# Patient Record
Sex: Female | Born: 1985 | Race: White | Hispanic: No | Marital: Married | Smoking: Never smoker
Health system: Southern US, Community
[De-identification: ages and names within clinical notes are randomized; demographics above are authoritative.]

## PROBLEM LIST (undated history)

## (undated) ENCOUNTER — Inpatient Hospital Stay (HOSPITAL_COMMUNITY): Payer: Self-pay

## (undated) DIAGNOSIS — F419 Anxiety disorder, unspecified: Secondary | ICD-10-CM

## (undated) DIAGNOSIS — R87619 Unspecified abnormal cytological findings in specimens from cervix uteri: Secondary | ICD-10-CM

## (undated) DIAGNOSIS — N390 Urinary tract infection, site not specified: Secondary | ICD-10-CM

## (undated) DIAGNOSIS — IMO0002 Reserved for concepts with insufficient information to code with codable children: Secondary | ICD-10-CM

## (undated) DIAGNOSIS — Q752 Hypertelorism: Secondary | ICD-10-CM

## (undated) DIAGNOSIS — D649 Anemia, unspecified: Secondary | ICD-10-CM

## (undated) DIAGNOSIS — B359 Dermatophytosis, unspecified: Secondary | ICD-10-CM

## (undated) DIAGNOSIS — G43909 Migraine, unspecified, not intractable, without status migrainosus: Secondary | ICD-10-CM

## (undated) DIAGNOSIS — F329 Major depressive disorder, single episode, unspecified: Secondary | ICD-10-CM

## (undated) DIAGNOSIS — R480 Dyslexia and alexia: Secondary | ICD-10-CM

## (undated) DIAGNOSIS — F32A Depression, unspecified: Secondary | ICD-10-CM

## (undated) DIAGNOSIS — A749 Chlamydial infection, unspecified: Secondary | ICD-10-CM

## (undated) DIAGNOSIS — F319 Bipolar disorder, unspecified: Secondary | ICD-10-CM

## (undated) DIAGNOSIS — A549 Gonococcal infection, unspecified: Secondary | ICD-10-CM

## (undated) HISTORY — DX: Depression, unspecified: F32.A

## (undated) HISTORY — DX: Major depressive disorder, single episode, unspecified: F32.9

## (undated) HISTORY — PX: INDUCED ABORTION: SHX677

## (undated) HISTORY — PX: TONSILLECTOMY: SUR1361

## (undated) HISTORY — DX: Anxiety disorder, unspecified: F41.9

## (undated) HISTORY — DX: Hypertelorism: Q75.2

## (undated) HISTORY — PX: MYRINGOTOMY: SUR874

## (undated) HISTORY — PX: EYE SURGERY: SHX253

## (undated) HISTORY — DX: Dermatophytosis, unspecified: B35.9

## (undated) HISTORY — DX: Gonococcal infection, unspecified: A54.9

---
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
## 2005-01-08 ENCOUNTER — Emergency Department (HOSPITAL_COMMUNITY): Admission: EM | Admit: 2005-01-08 | Discharge: 2005-01-09 | Payer: Self-pay | Admitting: Emergency Medicine

## 2005-01-23 ENCOUNTER — Emergency Department (HOSPITAL_COMMUNITY): Admission: EM | Admit: 2005-01-23 | Discharge: 2005-01-23 | Payer: Self-pay | Admitting: Emergency Medicine

## 2005-02-25 ENCOUNTER — Emergency Department (HOSPITAL_COMMUNITY): Admission: EM | Admit: 2005-02-25 | Discharge: 2005-02-26 | Payer: Self-pay | Admitting: Emergency Medicine

## 2005-03-09 ENCOUNTER — Emergency Department (HOSPITAL_COMMUNITY): Admission: EM | Admit: 2005-03-09 | Discharge: 2005-03-09 | Payer: Self-pay | Admitting: Emergency Medicine

## 2005-04-21 ENCOUNTER — Emergency Department (HOSPITAL_COMMUNITY): Admission: EM | Admit: 2005-04-21 | Discharge: 2005-04-22 | Payer: Self-pay | Admitting: Emergency Medicine

## 2005-04-30 ENCOUNTER — Emergency Department (HOSPITAL_COMMUNITY): Admission: EM | Admit: 2005-04-30 | Discharge: 2005-04-30 | Payer: Self-pay | Admitting: Emergency Medicine

## 2005-11-04 ENCOUNTER — Other Ambulatory Visit: Admission: RE | Admit: 2005-11-04 | Discharge: 2005-11-04 | Payer: Self-pay | Admitting: Obstetrics and Gynecology

## 2006-04-30 ENCOUNTER — Emergency Department (HOSPITAL_COMMUNITY): Admission: EM | Admit: 2006-04-30 | Discharge: 2006-04-30 | Payer: Self-pay | Admitting: Emergency Medicine

## 2006-06-06 ENCOUNTER — Emergency Department (HOSPITAL_COMMUNITY): Admission: EM | Admit: 2006-06-06 | Discharge: 2006-06-07 | Payer: Self-pay | Admitting: Emergency Medicine

## 2006-10-18 ENCOUNTER — Emergency Department (HOSPITAL_COMMUNITY): Admission: EM | Admit: 2006-10-18 | Discharge: 2006-10-18 | Payer: Self-pay | Admitting: Emergency Medicine

## 2007-03-22 ENCOUNTER — Emergency Department (HOSPITAL_COMMUNITY): Admission: EM | Admit: 2007-03-22 | Discharge: 2007-03-22 | Payer: Self-pay | Admitting: Family Medicine

## 2007-08-23 ENCOUNTER — Emergency Department (HOSPITAL_COMMUNITY): Admission: EM | Admit: 2007-08-23 | Discharge: 2007-08-23 | Payer: Self-pay | Admitting: Emergency Medicine

## 2007-09-21 ENCOUNTER — Ambulatory Visit: Payer: Self-pay | Admitting: *Deleted

## 2007-09-21 ENCOUNTER — Encounter: Payer: Self-pay | Admitting: Internal Medicine

## 2007-09-21 DIAGNOSIS — F330 Major depressive disorder, recurrent, mild: Secondary | ICD-10-CM | POA: Insufficient documentation

## 2007-09-21 LAB — CONVERTED CEMR LAB
ALT: 23 units/L (ref 0–35)
AST: 15 units/L (ref 0–37)
Alkaline Phosphatase: 71 units/L (ref 39–117)
BUN: 11 mg/dL (ref 6–23)
Beta hcg, urine, semiquantitative: NEGATIVE
Calcium: 8.9 mg/dL (ref 8.4–10.5)
Chlamydia, Swab/Urine, PCR: NEGATIVE
Chloride: 107 meq/L (ref 96–112)
Cholesterol: 135 mg/dL (ref 0–200)
Eosinophils Absolute: 0.1 10*3/uL (ref 0.0–0.7)
Glucose, Bld: 91 mg/dL (ref 70–99)
Lymphocytes Relative: 24 % (ref 12–46)
Lymphs Abs: 2.1 10*3/uL (ref 0.7–4.0)
MCHC: 31.6 g/dL (ref 30.0–36.0)
Monocytes Relative: 5 % (ref 3–12)
Neutrophils Relative %: 70 % (ref 43–77)
Platelets: 266 10*3/uL (ref 150–400)
Potassium: 4.4 meq/L (ref 3.5–5.3)
Total CHOL/HDL Ratio: 3.2
Total Protein: 6.9 g/dL (ref 6.0–8.3)
Triglycerides: 104 mg/dL (ref <150)
VLDL: 21 mg/dL (ref 0–40)
WBC: 8.9 10*3/uL (ref 4.0–10.5)

## 2007-11-24 ENCOUNTER — Encounter: Admission: RE | Admit: 2007-11-24 | Discharge: 2007-11-24 | Payer: Self-pay | Admitting: Obstetrics and Gynecology

## 2008-03-14 ENCOUNTER — Encounter: Payer: Self-pay | Admitting: Internal Medicine

## 2008-03-14 ENCOUNTER — Ambulatory Visit: Payer: Self-pay | Admitting: Internal Medicine

## 2008-03-14 LAB — CONVERTED CEMR LAB: TSH: 1.398 microintl units/mL (ref 0.350–4.50)

## 2008-05-10 ENCOUNTER — Emergency Department (HOSPITAL_COMMUNITY): Admission: EM | Admit: 2008-05-10 | Discharge: 2008-05-10 | Payer: Self-pay | Admitting: Emergency Medicine

## 2008-05-19 ENCOUNTER — Emergency Department (HOSPITAL_COMMUNITY): Admission: EM | Admit: 2008-05-19 | Discharge: 2008-05-19 | Payer: Self-pay | Admitting: Emergency Medicine

## 2008-05-26 ENCOUNTER — Ambulatory Visit: Payer: Self-pay | Admitting: Internal Medicine

## 2008-05-26 DIAGNOSIS — S82899A Other fracture of unspecified lower leg, initial encounter for closed fracture: Secondary | ICD-10-CM | POA: Insufficient documentation

## 2008-05-30 ENCOUNTER — Encounter (INDEPENDENT_AMBULATORY_CARE_PROVIDER_SITE_OTHER): Payer: Self-pay | Admitting: Internal Medicine

## 2008-08-10 ENCOUNTER — Inpatient Hospital Stay (HOSPITAL_COMMUNITY): Admission: AD | Admit: 2008-08-10 | Discharge: 2008-08-10 | Payer: Self-pay | Admitting: Obstetrics and Gynecology

## 2008-10-10 ENCOUNTER — Encounter: Payer: Self-pay | Admitting: Internal Medicine

## 2008-10-17 ENCOUNTER — Ambulatory Visit: Payer: Self-pay | Admitting: Internal Medicine

## 2008-10-17 ENCOUNTER — Ambulatory Visit (HOSPITAL_COMMUNITY): Admission: RE | Admit: 2008-10-17 | Discharge: 2008-10-17 | Payer: Self-pay | Admitting: Internal Medicine

## 2008-10-17 DIAGNOSIS — N898 Other specified noninflammatory disorders of vagina: Secondary | ICD-10-CM | POA: Insufficient documentation

## 2008-10-17 DIAGNOSIS — K625 Hemorrhage of anus and rectum: Secondary | ICD-10-CM

## 2008-10-17 DIAGNOSIS — R1031 Right lower quadrant pain: Secondary | ICD-10-CM

## 2008-10-17 DIAGNOSIS — K6289 Other specified diseases of anus and rectum: Secondary | ICD-10-CM

## 2008-10-17 LAB — CONVERTED CEMR LAB
Basophils Relative: 0 % (ref 0–1)
CO2: 27 meq/L (ref 19–32)
Calcium: 9 mg/dL (ref 8.4–10.5)
Candida species: NEGATIVE
Creatinine, Ser: 0.7 mg/dL (ref 0.40–1.20)
Eosinophils Absolute: 0 10*3/uL (ref 0.0–0.7)
MCHC: 34.2 g/dL (ref 30.0–36.0)
MCV: 85.1 fL (ref >=78.0)
Monocytes Relative: 6 % (ref 3–12)
Neutrophils Relative %: 73 % (ref 43–77)
Platelets: 298 10*3/uL (ref 150–400)

## 2008-10-21 ENCOUNTER — Encounter: Payer: Self-pay | Admitting: Internal Medicine

## 2008-10-23 ENCOUNTER — Emergency Department (HOSPITAL_COMMUNITY): Admission: EM | Admit: 2008-10-23 | Discharge: 2008-10-23 | Payer: Self-pay | Admitting: Emergency Medicine

## 2008-11-06 ENCOUNTER — Emergency Department (HOSPITAL_COMMUNITY): Admission: EM | Admit: 2008-11-06 | Discharge: 2008-11-06 | Payer: Self-pay | Admitting: Emergency Medicine

## 2008-11-14 ENCOUNTER — Ambulatory Visit: Payer: Self-pay | Admitting: Gastroenterology

## 2009-01-26 ENCOUNTER — Emergency Department (HOSPITAL_COMMUNITY): Admission: EM | Admit: 2009-01-26 | Discharge: 2009-01-26 | Payer: Self-pay | Admitting: Emergency Medicine

## 2009-04-22 ENCOUNTER — Emergency Department (HOSPITAL_COMMUNITY): Admission: EM | Admit: 2009-04-22 | Discharge: 2009-04-22 | Payer: Self-pay | Admitting: Emergency Medicine

## 2009-05-03 ENCOUNTER — Emergency Department (HOSPITAL_COMMUNITY): Admission: EM | Admit: 2009-05-03 | Discharge: 2009-05-04 | Payer: Self-pay | Admitting: Emergency Medicine

## 2009-05-21 ENCOUNTER — Emergency Department (HOSPITAL_COMMUNITY): Admission: EM | Admit: 2009-05-21 | Discharge: 2009-05-22 | Payer: Self-pay | Admitting: Emergency Medicine

## 2009-05-23 ENCOUNTER — Emergency Department (HOSPITAL_COMMUNITY): Admission: EM | Admit: 2009-05-23 | Discharge: 2009-05-23 | Payer: Self-pay | Admitting: Emergency Medicine

## 2009-12-21 ENCOUNTER — Emergency Department (HOSPITAL_COMMUNITY): Admission: EM | Admit: 2009-12-21 | Discharge: 2009-12-21 | Payer: Self-pay | Admitting: Emergency Medicine

## 2010-03-09 ENCOUNTER — Encounter: Payer: Self-pay | Admitting: *Deleted

## 2010-05-24 LAB — URINALYSIS, ROUTINE W REFLEX MICROSCOPIC
Glucose, UA: NEGATIVE mg/dL
Ketones, ur: 15 mg/dL — AB
Specific Gravity, Urine: 1.036 — ABNORMAL HIGH (ref 1.005–1.030)
pH: 6 (ref 5.0–8.0)

## 2010-05-24 LAB — CBC
HCT: 40.8 % (ref 36.0–46.0)
Hemoglobin: 13.8 g/dL (ref 12.0–15.0)
MCV: 85.7 fL (ref 78.0–100.0)
Platelets: 291 10*3/uL (ref 150–400)
RBC: 4.76 MIL/uL (ref 3.87–5.11)
WBC: 8.9 10*3/uL (ref 4.0–10.5)

## 2010-05-24 LAB — POCT I-STAT, CHEM 8
BUN: 7 mg/dL (ref 6–23)
Calcium, Ion: 1.16 mmol/L (ref 1.12–1.32)
Creatinine, Ser: 0.9 mg/dL (ref 0.4–1.2)
Glucose, Bld: 99 mg/dL (ref 70–99)
Hemoglobin: 14.3 g/dL (ref 12.0–15.0)
Sodium: 142 mEq/L (ref 135–145)
TCO2: 27 mmol/L (ref 0–100)

## 2010-05-24 LAB — GC/CHLAMYDIA PROBE AMP, GENITAL: Chlamydia, DNA Probe: POSITIVE — AB

## 2010-05-24 LAB — DIFFERENTIAL
Eosinophils Absolute: 0 10*3/uL (ref 0.0–0.7)
Eosinophils Relative: 0 % (ref 0–5)
Lymphocytes Relative: 21 % (ref 12–46)
Lymphs Abs: 1.9 10*3/uL (ref 0.7–4.0)
Monocytes Absolute: 0.6 10*3/uL (ref 0.1–1.0)
Monocytes Relative: 7 % (ref 3–12)

## 2010-05-24 LAB — URINE MICROSCOPIC-ADD ON

## 2010-05-24 LAB — URINE CULTURE
Colony Count: NO GROWTH
Culture: NO GROWTH

## 2010-05-24 LAB — WET PREP, GENITAL: Clue Cells Wet Prep HPF POC: NONE SEEN

## 2010-06-19 ENCOUNTER — Encounter: Payer: Self-pay | Admitting: Internal Medicine

## 2010-06-19 ENCOUNTER — Ambulatory Visit (INDEPENDENT_AMBULATORY_CARE_PROVIDER_SITE_OTHER): Payer: Self-pay | Admitting: Internal Medicine

## 2010-06-19 VITALS — BP 126/86 | HR 67 | Temp 98.4°F | Ht 66.0 in | Wt 271.5 lb

## 2010-06-19 DIAGNOSIS — Z2089 Contact with and (suspected) exposure to other communicable diseases: Secondary | ICD-10-CM

## 2010-06-19 DIAGNOSIS — Z23 Encounter for immunization: Secondary | ICD-10-CM

## 2010-06-19 MED ORDER — PERMETHRIN 1 % EX LOTN: TOPICAL_LOTION | CUTANEOUS | Status: DC

## 2010-06-19 NOTE — Progress Notes (Signed)
  Subjective:    Patient ID: Martha Mathews, female    DOB: 02-01-1986, 25 y.o.   MRN: [id number]  HPI Here with focaReview of Systemsl c/o very pruritic rash. No known contacts with similar sx's-25 yr old at home, no rashes or sx's        Objective:   Physical Exam Punctate rash on trunk, skin folds, sparing face and scalp      Assessment & Plan:   Scabies Elimite cream Shown Med[facility] Plus video on topic, and instructed on precautions, washing of clothing, and what not to do.

## 2010-06-24 ENCOUNTER — Encounter: Payer: Self-pay | Admitting: Internal Medicine

## 2010-07-03 ENCOUNTER — Ambulatory Visit (INDEPENDENT_AMBULATORY_CARE_PROVIDER_SITE_OTHER): Payer: Medicaid Other | Admitting: Internal Medicine

## 2010-07-03 ENCOUNTER — Encounter: Payer: Self-pay | Admitting: Internal Medicine

## 2010-07-03 DIAGNOSIS — F330 Major depressive disorder, recurrent, mild: Secondary | ICD-10-CM

## 2010-07-03 DIAGNOSIS — J029 Acute pharyngitis, unspecified: Secondary | ICD-10-CM | POA: Insufficient documentation

## 2010-07-03 MED ORDER — ESCITALOPRAM OXALATE 10 MG PO TABS: 10.0000 mg | ORAL_TABLET | Freq: Every day | ORAL | Status: DC

## 2010-07-03 NOTE — Patient Instructions (Signed)
Pharyngitis (Viral and Bacterial) Pharyngitis is soreness (inflammation) or infection of the pharynx. It is also called a sore throat. CAUSES Most sore throats are caused by viruses and are part of a cold. However, some sore throats are caused by strep and other bacteria. Sore throats can also be caused by post nasal drip from draining sinuses, allergies and sometimes from sleeping with an open mouth. Infectious sore throats can be spread from person to person by coughing, sneezing and sharing cups or eating utensils. TREATMENT Sore throats that are viral usually last 3-4 days. Viral illness will get better without medications (antibiotics). Strep throat and other bacterial infections will usually begin to get better about 24-48 hours after you begin to take antibiotics. HOME CARE INSTRUCTIONS  If the caregiver feels there is a bacterial infection or if there is a positive strep test, they will prescribe an antibiotic. The full course of antibiotics must be taken!! If the full course of antibiotic is not taken, you or your child may become ill again. If you or your child has strep throat and do not finish all of the medication, serious heart or kidney diseases may develop.   Drink enough water and fluids to keep your urine clear or pale yellow.   Only take over-the-counter or prescription medicines for pain, discomfort or fever as directed by your caregiver.   Get lots of rest.   Gargle with salt water ( tsp. of salt in a glass of water) as often as every 1-2 hours as you need for comfort.   Hard candies may soothe the throat if individual is not at risk for choking. Throat sprays or lozenges may also be used.  SEEK MEDICAL CARE IF:  Large, tender lumps in the neck develop.   A rash develops.   Green, yellow-brown or bloody sputum is coughed up.   You or your child has an oral temperature above 102 F (38.9 C).   Your baby is older than 3 months with a rectal temperature of 100.5 F  (38.1 C) or higher for more than 1 day.  SEEK IMMEDIATE MEDICAL CARE IF:  A stiff neck develops.   You or your child are drooling or unable to swallow liquids.   You or your child are vomiting, unable to keep medications or liquids down.   You or your child has severe pain, unrelieved with recommended medications.   You or your child are having difficulty breathing (not due to stuffy nose).   You or your child are unable to fully open your mouth.   You or your child develop redness, swelling, or severe pain anywhere on the neck.   You or your child has an oral temperature above 102 F (38.9 C), not controlled by medicine.   Your baby is older than 3 months with a rectal temperature of 102 F (38.9 C) or higher.   Your baby is 52 months old or younger with a rectal temperature of 100.4 F (38 C) or higher.  MAKE SURE YOU:   Understand these instructions.   Will watch your condition.   Will get help right away if you are not doing well or get worse.  Document Released: 02/03/2005 Document Re-Released: 07/24/2009 [facility] Patient Information 2011 [place], [place].  1. Start the Lexapro 10 mg tablets daily.  If it makes you tired you can take it in the evening.  Keep an eye out for warning signs such as coming up with a plan to hurt yourself or others, thoughts  of hurting yourself or others, and not being able to get out of bed or eat or get the things you need to get done done.  If you experience them please call the clinic or go to the ER for help.  2. Follow up in 2-4 weeks to see how your doing.  If you experience very bad dry mouth, constipation, and inability to urinate you should call the office.

## 2010-07-03 NOTE — Progress Notes (Signed)
Subjective:    Patient ID: Martha Mathews, female    DOB: 1985-09-13, 25 y.o.   MRN: [id number]  HPI  Ms. Martha Mathews is a 25 year old woman who presents to the clinic today with complaints of a sore throat for the last 3 days.  She started to notice some pain with swallowing and a "scratchy" feeling in her throat about 3 days ago and it has progress to where it hurts to swallow both liquids and solids.  She states that the food does not get stuck and she has been able to eat, just painfully.  Denies any fevers, chills, nausea, vomiting, chest pain, night sweats, lumps in her neck or armpits, or choking sensation.  She has a history of strep throat as a child, tonsillectomy at age 25, and recurrent ear infections as a child. She does have some ear congestion and nasal congestion but denies any post nasal drip or productive cough.  She does have a mild non-productive cough.     She also states that she has been feeling more depressed as of late.  She has noticed that she isn't able to sleep more then a few hours per night as well as being very irritable with family members and her son.  She has some significant stressors in her life right now.  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  Her son was originally diagnosed with autism but now appears to be more ADHD and is in counseling at age 25.  She denies any SI/HI.  She reports decreased concentration, sleeplessness, difficulty falling asleep, decreased motivation, and some feelings of doom like things are happening to her son.  She denies any family history of bipolar disorder but has been treated for depression in the past.  Denies any hallucinations, racing thoughts, or mania.  She also denies drug use and tobacco use.  She does drink alcohol on the weekends with friends but no more then 1 or 2 drinks in a sitting.   Review of Systems    Constitutional: Denies fever, chills, diaphoresis, appetite change and fatigue.  HEENT:  Denies photophobia, eye pain, redness, hearing loss, ear pain, congestion, sore throat, rhinorrhea, sneezing, mouth sores, trouble swallowing, neck pain, neck stiffness and tinnitus.   Respiratory: Denies SOB, DOE, cough, chest tightness,  and wheezing.   Cardiovascular: Denies chest pain, palpitations and leg swelling.  Gastrointestinal: Denies nausea, vomiting, abdominal pain, diarrhea, constipation, blood in stool and abdominal distention.  Genitourinary: Denies dysuria, urgency, frequency, hematuria, flank pain and difficulty urinating.  Musculoskeletal: Denies myalgias, back pain, joint swelling, arthralgias and gait problem.  Skin: Denies pallor, rash and wound.  Neurological: Denies dizziness, seizures, syncope, weakness, light-headedness, numbness and headaches.  Hematological: Denies adenopathy. Easy bruising, personal or family bleeding history  Psychiatric/Behavioral: Positive for mood changes,sleep disturbance, and irritability Denies suicidal ideation,  confusion, nervousness, and agitation  Objective:   Physical Exam    Constitutional: Vital signs reviewed.  Patient is a well-developed and well-nourished woman in no acute distress and cooperative with exam. Alert and oriented x3.  Head: Normocephalic and atraumatic Ear: TM normal bilaterally, no erythema or bulging of the disks.  Mouth: mild pharyngeal erythema but no exudates, MMM Eyes: PERRL, EOMI, conjunctivae normal, No scleral icterus.  Neck: Supple, no lymphadenopathy, Trachea midline normal ROM, No JVD, mass, thyromegaly, or carotid bruit present.  Cardiovascular: RRR, S1 normal, S2 normal, no MRG, pulses symmetric and intact bilaterally Pulmonary/Chest: CTAB, no  wheezes, rales, or rhonchi Abdominal: Soft. Non-tender, non-distended, bowel sounds are normal, no masses, organomegaly, or guarding present.  GU: no CVA tenderness Musculoskeletal: No joint deformities, erythema, or stiffness, ROM full and no  nontender Hematology: no cervical, inginal, or axillary adenopathy.  Neurological: A&O x3, Strength is normal and symmetric bilaterally, cranial nerve II-XII are grossly intact, no focal motor deficit, sensory intact to light touch bilaterally.  Skin: Warm, dry and intact. No rash, cyanosis, or clubbing.  Psychiatric: tearful mood and flat affect. speech and behavior is normal. Judgment and thought content normal. Cognition and memory are normal.       Assessment & Plan:

## 2010-07-03 NOTE — Assessment & Plan Note (Signed)
She is undergoing another episode of her depression.  She has had some significant social stressors and states that her mood is now interferring with her life.  She has been on lexapro in the past and that seemed to help so we will start with Lexapro 10 mg daily and have her follow up in 2-4 weeks to see how she is doing.  If she is unable to afford the medication or if the insurance will not cover it she was told to call and we will plan to switch her to Celexa 20 mg daily.

## 2010-07-03 NOTE — Assessment & Plan Note (Signed)
Her symptoms are consistent with pharyngitis.  I don't see any indication of need to test for strep throat and since we are in the first 3 days of the illness it is likely viral in origin.  We will treat her conservatively for now with salt water gargles and cepachol and chloraseptic spray for comfort as well as tylenol and advil for the pain.  If in the next 5-7 days it has not improved she will call and we will give her a course of amoxicillin.  She was given information on pharyngitis and instructions to call if she gets a high fever or the pain is intolerable.

## 2010-07-05 NOTE — Op Note (Signed)
NAME:  DIAMANTE, TRUSZKOWSKI               ACCOUNT NO.:  [id number]   MEDICAL RECORD NO.:  [id number]          PATIENT TYPE:  INP   LOCATION:  1512                         FACILITY:  [facility]   PHYSICIAN:  Ollen Gross. Vernell Morgans, M.D. DATE OF BIRTH:  Jan 17, 1986   DATE OF PROCEDURE:  08/17/2004  DATE OF DISCHARGE:  08/18/2004                                 OPERATIVE REPORT   PREOPERATIVE DIAGNOSIS:  Gallstones.   POSTOP DIAGNOSIS:  Gallstones.   PROCEDURE:  Laparoscopic cholecystectomy with intraoperative cholangiogram.   SURGEON:  Dr. Carolynne Edouard.   ASSISTANT:  Dr. Orson Slick   ANESTHESIA:  General endotracheal.   PROCEDURE:  After informed consent was obtained, the patient was brought to  the operating room and placed in supine position on the operating table.  After adequate induction of general anesthesia, the patient's abdomen was  prepped with Betadine and draped in usual sterile manner. The area above the  umbilicus was infiltrated 0.25% Marcaine. A small incision was made with a  15 blade knife. This incision was carried down through the subcutaneous  tissue bluntly with a hemostat and [employer] retractors until the alba was  identified. The linea alba was incised with a 15 blade knife and each side  was grasped with Kocher clamps and elevated anteriorly. The preperitoneal  space was then probed bluntly with hemostat until the peritoneum was opened  and access was gained to the abdominal cavity. The 0 Vicryl pursestring  stitch was placed in the fascia surrounding the opening. A Hasson cannula  was placed through the opening and anchored in place with the previously  placed Vicryl pursestring stitch.  The abdomen was then insufflated carbon  dioxide without difficulty. The patient was placed in head-up position and  rotated slightly with the right side up.  The laparoscope was inserted  through the Hasson cannula and the right upper quadrant was inspected. The  dome of the gallbladder and liver  readily identified. Next the epigastric  region was infiltrated 4% Marcaine. A small incision was made with a 15  blade knife and a 10 mm port was placed bluntly through this incision into  the abdominal cavity under direct vision. Sites were then chosen laterally  on the right side of the abdomen for placement of the 5 mm ports. Each of  these areas was infiltrated with .25% Marcaine. Small stab incisions were  made with a 15 blade knife and 5 mm ports were placed bluntly through these  incisions into the abdominal cavity under direct vision.  A blunt grasper  was placed through the lateral most 5 mm port and used to grasp the dome of  the gallbladder and elevate it anteriorly and superiorly. Another blunt  grasper was placed through the other 5 mm port and used to retract on the  body and neck of the gallbladder. A dissector was placed through the  epigastric port and using electrocautery, the peritoneal reflection of the  gallbladder neck was opened. Blunt dissection was then carried out in this  area until the gallbladder neck cystic duct junction was readily identified  and a  good window was created. A single clip was placed on the gallbladder  neck. A small ductotomy was made just below the clip with a laparoscopic  scissors. A 14 gauge angiocath was then placed percutaneously through the  anterior abdominal wall under direct vision. A Reddick cholangiogram  catheter was placed through the angiocath and flushed. The Reddick catheter  was then placed within the cystic duct and anchored in place with the clip.  A cholangiogram was obtained that showed no filling defects, good emptying  into the duodenum and good length on the cystic duct.  The anchoring clip  and catheters were then removed from the patient. Three clips were placed  proximally on the cystic duct and duct was divided between the two sets of  clips. Posterior to this, the cystic artery was identified and again  dissected  bluntly in a circumferential manner until a good window was  created. Two clips were placed proximally and one distally in the artery and  the artery was divided between the two sets of clips. Next a laparoscopic  hook cautery device and used to separate the gallbladder from the liver bed.  Prior to completely detaching the gallbladder from the liver bed. The liver  bed was inspected and several small bleeding points were coagulated with  electrocautery until the area was completely hemostatic. The gallbladder was  then detached arrest away from the liver bed without difficulty using the  hook electrocautery. The laparoscope was then moved to the epigastric port.  The gallbladder grasper was placed through the Hasson cannula and grasped  the neck of the gallbladder. The gallbladder with the Hasson cannula was  then removed through the supraumbilical port without difficulty.  The  fascial defect was closed with the previously placed Vicryl pursestring  stitch as well as with another interrupted 0 Vicryl stitch.  The abdomen was  then irrigated with copious amounts of saline until the effluent was clear.  The rest of the ports were removed under direct vision and were all found to  be hemostatic. The gas was allowed to escape. Skin incisions were all closed  with interrupted 4-0 Monocryl subcuticular stitches. Benzoin, Steri-Strips  and sterile dressings were applied. The patient tolerated well. At the end  of the case, all needle, sponge and instrument counts were correct. The  patient was then awakened and taken to recovery room in stable condition.       PST/MEDQ  D:  08/19/2004  T:  08/19/2004  Job:  [id number]

## 2010-07-05 NOTE — H&P (Signed)
NAME:  Martha, Mathews               ACCOUNT NO.:  [id number]   MEDICAL RECORD NO.:  [id number]          PATIENT TYPE:  MAT   LOCATION:  MATC                          FACILITY:  [facility]   PHYSICIAN:  Ollen Gross. Vernell Morgans, M.D. DATE OF BIRTH:  [birth year]   DATE OF ADMISSION:  08/16/2004  DATE OF DISCHARGE:                                HISTORY & PHYSICAL   HISTORY:  Ms. Martha Mathews is a 25 year old white female whose now nine days status  post C section. She has had known gallstones since April. She has been  having, over the last few weeks, severe right upper quadrant pain that has  been occuring on a daily basis and was not associated with nausea and  vomiting until today. She denies any fevers or chills. The pain is mostly in  the right upper quadrant and radiates to her back. The pain has not been  controllable with narcotic pain medicine at home. The rest of her review of  systems is unremarkable.   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   PAST SURGICAL HISTORY:  Significant for C section, tonsils and adenoids, eye  surgery, myringotomy tubes.   MEDICATIONS:  Percocet.   ALLERGIES:  No known drug allergies.   SOCIAL HISTORY:  She denies the use of alcohol or tobacco products.   FAMILY HISTORY:  Noncontributory.   PHYSICAL EXAMINATION:  VITAL SIGNS:  Her temperature is 97 degrees, blood  pressure 105/54, pulse of 86.  GENERAL:  She is an obese white female in no acute distress.  SKIN:  Warm and dry with no jaundice.  HEENT:  Eyes are extraocular muscles are intact. Pupils equal round and  reactive to light. Sclerae nonicteric.  LUNGS:  Clear bilaterally with no use of accessory respiratory muscles.  HEART:  Regular rate and rhythm with an impulse in the left chest.  ABDOMEN:  Soft with some mild to moderate right upper quadrant pain but no  guarding or peritoneal signs.  EXTREMITIES:  No cyanosis, clubbing or edema with good strength in  her arms  and legs.  PSYCHOLOGIC:  She is alert and oriented x3 with no evidence of anxiety or  depression.   LABORATORY DATA:  On review of her lab work, her white count was 13.4. Her  SGOT, SGPT, alk phos were slightly elevated, total bili was normal.   On reviewing her ultrasound report, she does have stones in her gallbladder  but no gallbladder wall thickening or ductal dilatation.   ASSESSMENT/PLAN:  This is a 25 year old white female with what sounds like  symptomatic gallstones. Her pain is now unable to be controlled with  narcotic pain medicine and I suspect she is going to require cholecystectomy  to relieve her pain in the near future. Having had a C section nine days ago  may complicate her surgery a little bit given there are adhesions, but I do  not think she will be able to wait six weeks from her last surgery. I have  discussed this with her in detail including the risks and benefits  of  surgery as well as some of the technical aspects and she understands and  wishes to proceed as soon as possible. We will transfer her to [facility] and admit her and plan for surgery in the morning.       PST/MEDQ  D:  08/16/2004  T:  08/16/2004  Job:  [id number]

## 2010-07-05 NOTE — Discharge Summary (Signed)
NAME:  Martha Mathews, Martha Mathews               ACCOUNT NO.:  [id number]   MEDICAL RECORD NO.:  [id number]          PATIENT TYPE:  INP   LOCATION:  9116                          FACILITY:  [facility]   PHYSICIAN:  Malachi Pro. Ambrose Mantle, M.D. DATE OF BIRTH:  14-Sep-1985   DATE OF ADMISSION:  08/07/2004  DATE OF DISCHARGE:  08/10/2004                                 DISCHARGE SUMMARY   HISTORY OF PRESENT ILLNESS:  This is a 25 year old white female admitted to  the hospital for cesarean section because of intrauterine pregnancy at 38  weeks, Chiari malformation and cholelithiasis.  This patient's prenatal  course is dictated in the history and physical.   HOSPITAL COURSE:  She underwent a primary low transverse cervical cesarean  section by Dr. Jackelyn Knife with Dr. Ambrose Mantle assisted under general anesthesia.  Blood loss was estimated at about 1000 mL.  The infant was healthy at birth.  It was a living female infant, 7 pounds 5 ounces, with Apgars of 8 at one  minute and 9 at five minutes.  Postoperatively, the patient did very well.  She was ambulatory, tolerating a diet and voiding well without difficulty.  Her abdomen was soft and nontender.  The incision was healing well.  Staples  were removed.  Strips were applied.  She was ready for discharge on the  third postoperative day.   LABORATORY DATA:  Blood group and type were O positive with a negative  antibody.  RPR was nonreactive.  Rubella was immune.  The initial hemoglobin  was 10.9, hematocrit 33.0, white count 10,400 and platelet count 350,000.  Followup hemoglobin 7.8, hematocrit 23.2 and white count 11,600.  RPR was  nonreactive.   FINAL DIAGNOSES:  1.  Intrauterine pregnancy at 38 weeks, delivered vertex by cesarean      section.  2.  Cholelithiasis.  3.  Chiari malformation.   OPERATION:  Low transverse cervical C-section.   FINAL CONDITION:  Improved.   INSTRUCTIONS:  Include our regular discharge instruction booklet.   DISCHARGE MEDICATIONS:   The patient is given a prescription for Percocet  5/325 mg 24 tablets one every four to six hours as needed for pain.   FOLLOWUP:  The patient is advised to return in two weeks for followup  examination.       TFH/MEDQ  D:  08/10/2004  T:  08/11/2004  Job:  [id number]

## 2010-07-18 ENCOUNTER — Encounter: Payer: Self-pay | Admitting: Internal Medicine

## 2010-07-18 ENCOUNTER — Ambulatory Visit (INDEPENDENT_AMBULATORY_CARE_PROVIDER_SITE_OTHER): Payer: Medicaid Other | Admitting: Internal Medicine

## 2010-07-18 DIAGNOSIS — G44209 Tension-type headache, unspecified, not intractable: Secondary | ICD-10-CM | POA: Insufficient documentation

## 2010-07-18 DIAGNOSIS — F330 Major depressive disorder, recurrent, mild: Secondary | ICD-10-CM

## 2010-07-18 NOTE — Patient Instructions (Signed)
Tension Headache (Muscle Contraction Headache) Tension headache is one of the most common causes of head pain. These headaches are usually felt as a pain over the top of your head and back of your neck. Stress, anxiety, and depression are common triggers for these headaches. Tension headaches are not life-threatening and will not lead to other types of headaches. Tension headaches can often be diagnosed by taking a history from the patient and a physical exam. Sometimes, further lab and x-ray studies are used to confirm the diagnosis. Your caregiver can advise you on how to get help solving problems that cause anxiety or stress. Antidepressants can be prescribed if depression is a problem. HOME CARE INSTRUCTIONS  If testing was done, call for your results. Remember, it is your responsibility to get the results of all testing. Do not assume everything is fine because you do not hear from your caregiver.   Only take over-the-counter or prescription medicines for pain, discomfort, or fever as directed by your caregiver.   Biofeedback, massage, or other relaxation techniques may be helpful.   Ice packs or heat to the head and neck can be used. Use these three to four times per day or as needed.   Physical therapy may be a useful addition to treatment.   If headaches continue, even with therapy, you may need to think about lifestyle changes.   Avoid excessive use of pain killers, as rebound headaches can occur.  1.  SEEK MEDICAL CARE IF:  You develop problems with medications prescribed.   You do not respond or get no relief from medications.   You have a change from the usual headache.   You develop nausea (feeling sick to your stomach) or vomiting.  SEEK IMMEDIATE MEDICAL CARE IF:   Your headache becomes severe.   You have an unexplained oral temperature above 101.   You develop a stiff neck.   You have loss of vision.   You have muscular weakness.   You have loss of muscular  control.   You develop severe symptoms different from your first symptoms.   You start losing your balance or have trouble walking.   You feel faint or pass out.  MAKE SURE YOU:   Understand these instructions.   Will watch your condition.   Will get help right away if you are not doing well or get worse.  Document Released: 02/03/2005 Document Re-Released: 05/02/2008 [facility] Patient Information 2011 [place], [place].  1. Continue taking your medication as prescribed.  2. Your headaches are from tension headaches. You can use the information to help you manage this.  3. You can use Ibuprofen 200 mg tablets 2-3 tablets every 6 hours as needed for the pain.  It is safe to take Tylenol 500 mg tablets as well 1-2 tablets every 6 hours as needed as well.  The best would be to take the medication early in the pain and you can alternate the ibuprofen and tylenol.

## 2010-07-18 NOTE — Progress Notes (Signed)
  Subjective:    Patient ID: Martha Mathews, female    DOB: 08/15/1985, 25 y.o.   MRN: [id number]  HPI  Ms. Martha Mathews is a 25 year old woman who presents today for follow up from her last visit.  She was started on Lexapro 10 mg at that visit and has been taking it without any side effects.  She states that she feels the same but her family has stated that she is less irritable and grumpy.  She denies any SI/HI or trouble sleeping today.     She has also had a headache for the past 2 days.  The pain is in the back of her head and is associated with some nausea.  She states that she wakes up and is fine in the morning but as the day goes on the headache gets progressively worse and by the evening it is a tight, sharp pain in her head.  She has tried only 1 ibuprofen tablet when the headache starts and that seems to help some.  She denies any fevers, chills, neck stiffness, vomiting, or trauma.  Review of Systems    Constitutional: Denies fever, chills, diaphoresis, appetite change and fatigue.  HEENT: Denies photophobia, eye pain, redness, hearing loss, ear pain, congestion, sore throat, rhinorrhea, sneezing, mouth sores, trouble swallowing, neck pain, neck stiffness and tinnitus.   Respiratory: Denies SOB, DOE, cough, chest tightness,  and wheezing.   Cardiovascular: Denies chest pain, palpitations and leg swelling.  Gastrointestinal: Denies nausea, vomiting, abdominal pain, diarrhea, constipation, blood in stool and abdominal distention.  Genitourinary: Denies dysuria, urgency, frequency, hematuria, flank pain and difficulty urinating.  Musculoskeletal: Denies myalgias, back pain, joint swelling, arthralgias and gait problem.  Skin: Denies pallor, rash and wound.  Neurological: Positive for headaches Denies dizziness, seizures, syncope, weakness, light-headedness, numbness.  Hematological: Denies adenopathy. Easy bruising, personal or family bleeding history  Psychiatric/Behavioral: Denies suicidal  ideation, mood changes, confusion, nervousness, sleep disturbance and agitation   Objective:   Physical Exam    Constitutional: Vital signs reviewed.  Patient is a well-developed and well-nourished, obese woman in no acute distress and cooperative with exam. Alert and oriented x3.  Head: Normocephalic and atraumatic Ear: TM normal bilaterally Mouth: no erythema or exudates, MMM Eyes: PERRL, EOMI, conjunctivae normal, No scleral icterus.  Neck: Supple, Trachea midline, ROM limited by pain in the flexion, extension, and bending bilaterally,  There is point tenderness over the trapezius insertion point in the base of the neck that triggered a headache.  No JVD, mass, thyromegaly, or carotid bruit present.  Cardiovascular: RRR, S1 normal, S2 normal, no MRG, pulses symmetric and intact bilaterally Pulmonary/Chest: CTAB, no wheezes, rales, or rhonchi Abdominal: Soft. Non-tender, non-distended, bowel sounds are normal, no masses, organomegaly, or guarding present.  GU: no CVA tenderness Musculoskeletal: No joint deformities, erythema, or stiffness, ROM full and no nontender Hematology: no cervical, inginal, or axillary adenopathy.  Neurological: A&O x3, Strength is normal and symmetric bilaterally, cranial nerve II-XII are grossly intact, no focal motor deficit, sensory intact to light touch bilaterally.  Skin: Warm, dry and intact. No rash, cyanosis, or clubbing.  Psychiatric: Mood is improved and affect is less flat. speech and behavior is normal. Judgment and thought content normal. Cognition and memory are normal.    Assessment & Plan:

## 2010-07-29 ENCOUNTER — Emergency Department (HOSPITAL_COMMUNITY)
Admission: EM | Admit: 2010-07-29 | Discharge: 2010-07-30 | Disposition: A | Discharge status: Home / Self Care | Payer: Medicaid Other | Attending: Emergency Medicine | Admitting: Emergency Medicine

## 2010-07-29 DIAGNOSIS — F3289 Other specified depressive episodes: Secondary | ICD-10-CM | POA: Insufficient documentation

## 2010-07-29 DIAGNOSIS — M62838 Other muscle spasm: Secondary | ICD-10-CM | POA: Insufficient documentation

## 2010-07-29 DIAGNOSIS — W108XXA Fall (on) (from) other stairs and steps, initial encounter: Secondary | ICD-10-CM | POA: Insufficient documentation

## 2010-07-29 DIAGNOSIS — R071 Chest pain on breathing: Secondary | ICD-10-CM | POA: Insufficient documentation

## 2010-07-29 DIAGNOSIS — M549 Dorsalgia, unspecified: Secondary | ICD-10-CM | POA: Insufficient documentation

## 2010-07-29 DIAGNOSIS — F329 Major depressive disorder, single episode, unspecified: Secondary | ICD-10-CM | POA: Insufficient documentation

## 2010-07-29 NOTE — Assessment & Plan Note (Signed)
She is improving on the Lexapro.  She was counseled that to see the full effect of the medication will take 6-8 weeks.  She is on a lower dose as well so we may need to adjust the dose in about a month when she follows up.

## 2010-07-29 NOTE — Assessment & Plan Note (Signed)
Her headache is consistent with a tension headache.  She was given stretching exercises to do as well as encouraged to use ice or heat packs whichever feel better.  We will also have her use Ibuprofen 200 mg tablets take 2-3 when the headache starts and continue q6 as needed for pain.

## 2010-07-30 ENCOUNTER — Emergency Department (HOSPITAL_COMMUNITY): Payer: Medicaid Other

## 2010-08-02 ENCOUNTER — Encounter: Payer: Self-pay | Admitting: Internal Medicine

## 2010-08-02 ENCOUNTER — Ambulatory Visit (INDEPENDENT_AMBULATORY_CARE_PROVIDER_SITE_OTHER): Payer: Medicaid Other | Admitting: Internal Medicine

## 2010-08-02 VITALS — BP 130/80 | HR 64 | Temp 97.0°F | Ht 65.6 in | Wt 268.7 lb

## 2010-08-02 DIAGNOSIS — R21 Rash and other nonspecific skin eruption: Secondary | ICD-10-CM

## 2010-08-02 DIAGNOSIS — B880 Other acariasis: Secondary | ICD-10-CM

## 2010-08-02 DIAGNOSIS — R238 Other skin changes: Secondary | ICD-10-CM | POA: Insufficient documentation

## 2010-08-02 DIAGNOSIS — F330 Major depressive disorder, recurrent, mild: Secondary | ICD-10-CM

## 2010-08-02 MED ORDER — DIPHENHYDRAMINE HCL 25 MG PO CAPS: 25.0000 mg | ORAL_CAPSULE | Freq: Four times a day (QID) | ORAL | Status: DC | PRN

## 2010-08-02 NOTE — Patient Instructions (Signed)
Please schedule a follow up visit as needed with PCP. Please take your medicines as prescribed. Please call the clinic if your symptoms fail to improve or does not get better in 2 weeks.

## 2010-08-02 NOTE — Assessment & Plan Note (Signed)
DD include chiggers vs poison ivy vs other insect bite. This is most likely chiggers given the the location of the lesions, intensity of itching the patient is having, characteristic grouped vesicular appearance and recent outdoor activity Patient was educated that these are self resolving and it might take one or 2 weeks for the lesions to resolve. Vigorous cleansing of lesions with soap and water was also advised. She was given a prescription for Benadryl for itching. She was advised to take a hot shower bath which she already did.

## 2010-08-02 NOTE — Progress Notes (Signed)
  Subjective:    Patient ID: Martha Mathews, female    DOB: 1985-12-15, 25 y.o.   MRN: [id number]  HPI: 25 year old woman with past medical history significant for depression comes to the clinic for blisters x2 days.  She noticed small, red, about 1-2 mm in diameter, circular lesions , 3-4 in number, on the anterior aspect of her right leg 3 days ago. They have been gradually increasing in size, are painful, associated with intense itching and have turned into blisters since yesterday. She has been scratching them rigorously, he started oozing some fluid since yesterday. She also got a new lesion on her right hand.  She states -she sits in her garden every day but was not sure if it was an insect bite.  Denies any fever, abdominal pain, nausea, vomiting or diarrhea. Denies any history of travel or taking any new medication. Denies having similar kind of lesions in the past.    Review of Systems  Constitutional: Negative for fever, chills, diaphoresis, activity change, appetite change and fatigue.  HENT: Negative for congestion, rhinorrhea, sneezing and postnasal drip.   Respiratory: Negative for cough, choking, chest tightness, shortness of breath and wheezing.   Cardiovascular: Negative for chest pain, palpitations and leg swelling.  Gastrointestinal: Negative for abdominal distention.  Genitourinary: Negative for flank pain and difficulty urinating.  Musculoskeletal: Negative for arthralgias.  Neurological: Negative for dizziness.  Hematological: Negative for adenopathy.       Objective:   Physical Exam  Constitutional: She is oriented to person, place, and time. She appears well-developed and well-nourished. No distress.  HENT:  Head: Normocephalic and atraumatic.  Mouth/Throat: Oropharynx is clear and moist.  Eyes: Conjunctivae and EOM are normal. Pupils are equal, round, and reactive to light.  Neck: Normal range of motion. Neck supple. No JVD present. No tracheal deviation  present. No thyromegaly present.  Cardiovascular: Normal rate, regular rhythm, normal heart sounds and intact distal pulses.   Pulmonary/Chest: Effort normal and breath sounds normal. No stridor. No respiratory distress. She has no wheezes. She has no rales. She exhibits no tenderness.  Abdominal: Soft. Bowel sounds are normal. She exhibits no distension and no mass. There is no tenderness. There is no rebound and no guarding.  Musculoskeletal: Normal range of motion.       5 red, circular about 3-4 mm in diameter, clustered lesions present, 2 of them appear macular, 3 appears vesicles on the anterior aspect of her right leg  Lymphadenopathy:    She has no cervical adenopathy.  Neurological: She is alert and oriented to person, place, and time. She has normal reflexes.  Skin: Skin is warm. She is not diaphoretic.          Assessment & Plan:

## 2010-08-02 NOTE — Assessment & Plan Note (Signed)
Denies any depressed mood. The medication has been helping her at current dose. Did not increase the dose.

## 2010-08-06 ENCOUNTER — Telehealth: Payer: Self-pay | Admitting: *Deleted

## 2010-08-06 NOTE — Telephone Encounter (Signed)
Call from pt left message that she has been drained would like to know what to do.  Message left for pt to call the Clinics.

## 2010-08-06 NOTE — Telephone Encounter (Signed)
Pt states she has been feeling weak and dizzy x 2 days. Wanted to know if Zoloft could be causing this; I told her I'm not sure. Zoloft is not on current med list and this is not a new medication.  Appt given w/ Dr. Loistine Chance for tomorrow @ 1:15PM; pt agreed.

## 2010-08-07 ENCOUNTER — Encounter: Payer: Self-pay | Admitting: Internal Medicine

## 2010-08-07 ENCOUNTER — Ambulatory Visit (INDEPENDENT_AMBULATORY_CARE_PROVIDER_SITE_OTHER): Payer: Medicaid Other | Admitting: Internal Medicine

## 2010-08-07 ENCOUNTER — Ambulatory Visit: Payer: Medicaid Other | Admitting: Internal Medicine

## 2010-08-07 VITALS — BP 133/88 | HR 78 | Temp 97.6°F | Ht 65.0 in | Wt 266.8 lb

## 2010-08-07 DIAGNOSIS — R238 Other skin changes: Secondary | ICD-10-CM

## 2010-08-07 DIAGNOSIS — R42 Dizziness and giddiness: Secondary | ICD-10-CM

## 2010-08-07 DIAGNOSIS — R109 Unspecified abdominal pain: Secondary | ICD-10-CM | POA: Insufficient documentation

## 2010-08-07 DIAGNOSIS — R21 Rash and other nonspecific skin eruption: Secondary | ICD-10-CM

## 2010-08-07 LAB — URINALYSIS, ROUTINE W REFLEX MICROSCOPIC
Bilirubin Urine: NEGATIVE
Glucose, UA: NEGATIVE mg/dL
Ketones, ur: NEGATIVE mg/dL
Specific Gravity, Urine: 1.027 (ref 1.005–1.030)

## 2010-08-07 LAB — CBC WITH DIFFERENTIAL/PLATELET
Basophils Absolute: 0 10*3/uL (ref 0.0–0.1)
Eosinophils Absolute: 0 10*3/uL (ref 0.0–0.7)
Eosinophils Relative: 0 % (ref 0–5)
HCT: 40.7 % (ref 36.0–46.0)
Lymphocytes Relative: 27 % (ref 12–46)
MCH: 27.5 pg (ref 26.0–34.0)
MCV: 84.3 fL (ref 78.0–100.0)
Monocytes Absolute: 0.4 10*3/uL (ref 0.1–1.0)
Platelets: 303 10*3/uL (ref 150–400)
RDW: 14.3 % (ref 11.5–15.5)
WBC: 9.5 10*3/uL (ref 4.0–10.5)

## 2010-08-07 LAB — HEPATIC FUNCTION PANEL
AST: 20 U/L (ref 0–37)
Alkaline Phosphatase: 64 U/L (ref 39–117)
Bilirubin, Direct: 0.1 mg/dL (ref 0.0–0.3)
Total Bilirubin: 0.4 mg/dL (ref 0.3–1.2)

## 2010-08-07 NOTE — Assessment & Plan Note (Signed)
Dizziness is not severe. Asked to drink plenty of fluid, BP is fine. Again unclear etiology. 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 Consider further work up if symptoms not resolved.

## 2010-08-07 NOTE — Patient Instructions (Signed)
Return in two weeks if not improved Call us if having fevers, nausea, vomiting or go to the Emergency Room Doctor will call you IF ANYTHING WRONG with your labs Drink plenty of water

## 2010-08-07 NOTE — Assessment & Plan Note (Signed)
Improved. Likely chigger bites as diagnosed before but would need to rule out lymes disease.

## 2010-08-07 NOTE — Assessment & Plan Note (Signed)
Unclear etiology. Not sure related to her previous insect bite. I will get LFTs, BMP to help with possible etiologies. Also check for UA and urine pregnancy.
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
## 2010-08-08 ENCOUNTER — Telehealth: Payer: Self-pay | Admitting: *Deleted

## 2010-08-08 LAB — BASIC METABOLIC PANEL WITH GFR
BUN: 10 mg/dL (ref 6–23)
CO2: 28 mEq/L (ref 19–32)
Calcium: 9.3 mg/dL (ref 8.4–10.5)
Chloride: 103 mEq/L (ref 96–112)
Creat: 0.69 mg/dL (ref 0.50–1.10)
GFR, Est Non African American: >60 mL/min (ref >60)

## 2010-08-08 NOTE — Telephone Encounter (Signed)
Pt calls and states she would like for md to call her w/ results of her "blood tests" may call at [phone]

## 2010-08-09 NOTE — Telephone Encounter (Signed)
Called the patient and let her know that it was negative.

## 2010-08-23 ENCOUNTER — Telehealth: Payer: Self-pay | Admitting: Internal Medicine

## 2010-08-23 ENCOUNTER — Emergency Department (HOSPITAL_COMMUNITY)
Admission: EM | Admit: 2010-08-23 | Discharge: 2010-08-24 | Disposition: A | Discharge status: Home / Self Care | Payer: Medicaid Other | Attending: Emergency Medicine | Admitting: Emergency Medicine

## 2010-08-23 DIAGNOSIS — F329 Major depressive disorder, single episode, unspecified: Secondary | ICD-10-CM | POA: Insufficient documentation

## 2010-08-23 DIAGNOSIS — Z79899 Other long term (current) drug therapy: Secondary | ICD-10-CM | POA: Insufficient documentation

## 2010-08-23 DIAGNOSIS — F3289 Other specified depressive episodes: Secondary | ICD-10-CM | POA: Insufficient documentation

## 2010-08-23 DIAGNOSIS — G43809 Other migraine, not intractable, without status migrainosus: Secondary | ICD-10-CM | POA: Insufficient documentation

## 2010-08-23 LAB — BASIC METABOLIC PANEL
CO2: 28 mEq/L (ref 19–32)
Calcium: 9.3 mg/dL (ref 8.4–10.5)
Creatinine, Ser: 0.72 mg/dL (ref 0.50–1.10)

## 2010-08-23 LAB — DIFFERENTIAL
Eosinophils Absolute: 0.1 10*3/uL (ref 0.0–0.7)
Eosinophils Relative: 1 % (ref 0–5)
Lymphs Abs: 4 10*3/uL (ref 0.7–4.0)
Monocytes Absolute: 0.5 10*3/uL (ref 0.1–1.0)
Monocytes Relative: 5 % (ref 3–12)

## 2010-08-23 LAB — CBC
MCH: 26.9 pg (ref 26.0–34.0)
MCHC: 32.9 g/dL (ref 30.0–36.0)
MCV: 81.8 fL (ref 78.0–100.0)
Platelets: 300 10*3/uL (ref 150–400)
RBC: 4.72 MIL/uL (ref 3.87–5.11)
RDW: 13.6 % (ref 11.5–15.5)

## 2010-08-23 NOTE — Telephone Encounter (Signed)
Phone call from the patient.  She states that she has had a headache for the last couple of days in the back of her neck.  She states that she has been just laying around the house.  She states that the pain is worse when she is up moving around but no specific neck movements that cause worse pain for her.  She has tried one ibuprofen and it did not help.  She has also tried a heating pad with minimal relief.  She states that she has had nausea with some vomiting as well.  She denies neck stiffness, fever, chills, or diarrhea.   We discussed home care for what sounds like a worsening of her tension headaches that she had at her last office visit with me.  Tylenol, ibuprofen, ice, heat, massage, and fluids.  She was told that if she is unable to keep down enough fluids or has a fever greater then 14 F that she should be evaluated at the emergency department.  Otherwise she should keep her appointment in the [facility] for Monday for evaluation.

## 2010-08-24 LAB — URINALYSIS, ROUTINE W REFLEX MICROSCOPIC
Ketones, ur: 15 mg/dL — AB
Nitrite: NEGATIVE
pH: 6 (ref 5.0–8.0)

## 2010-08-24 LAB — POCT PREGNANCY, URINE: Preg Test, Ur: NEGATIVE

## 2010-08-26 ENCOUNTER — Ambulatory Visit
Admission: RE | Admit: 2010-08-26 | Discharge: 2010-08-26 | Disposition: A | Discharge status: Home / Self Care | Payer: Medicaid Other | Source: Ambulatory Visit | Attending: Internal Medicine | Admitting: Internal Medicine

## 2010-08-26 ENCOUNTER — Encounter: Payer: Self-pay | Admitting: Internal Medicine

## 2010-08-26 ENCOUNTER — Ambulatory Visit (INDEPENDENT_AMBULATORY_CARE_PROVIDER_SITE_OTHER): Payer: Medicaid Other | Admitting: Internal Medicine

## 2010-08-26 VITALS — BP 127/90 | HR 67 | Temp 96.9°F | Ht 65.0 in | Wt 261.7 lb

## 2010-08-26 DIAGNOSIS — R51 Headache: Secondary | ICD-10-CM

## 2010-08-26 DIAGNOSIS — S61209A Unspecified open wound of unspecified finger without damage to nail, initial encounter: Secondary | ICD-10-CM

## 2010-08-26 DIAGNOSIS — S61011A Laceration without foreign body of right thumb without damage to nail, initial encounter: Secondary | ICD-10-CM | POA: Insufficient documentation

## 2010-08-26 DIAGNOSIS — R519 Headache, unspecified: Secondary | ICD-10-CM | POA: Insufficient documentation

## 2010-08-26 MED ORDER — SUMATRIPTAN SUCCINATE 50 MG PO TABS: ORAL_TABLET | ORAL | Status: DC

## 2010-08-26 MED ORDER — GADOBENATE DIMEGLUMINE 529 MG/ML IV SOLN
20.0000 mL | Freq: Once | INTRAVENOUS | Status: AC | PRN
  Administered 2010-08-26: 20 mL via INTRAVENOUS

## 2010-08-26 MED ORDER — BUTALBITAL-APAP-CAFFEINE 50-500-40 MG PO TABS: 1.0000 | ORAL_TABLET | Freq: Four times a day (QID) | ORAL | Status: DC | PRN

## 2010-08-26 NOTE — Progress Notes (Signed)
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  She states that there is no acute changes in her vision and that her vision has always been "bad".  Duration of headache 1-2 hours, intermittently.  She wakes up with a headache and gets progressively worse during the day.  She also reports memory lost x several months.   She has been taken Ibuprofen q4-6hrs and heating pads with minimal relief.   She endorses phonophobia but denies any photophobia, increased in stress.  Denies any recent head injury/trauma/domestic abuse.  Patient went to ER on Friday, was given benadryl and was sent home.    She reports that her father had brain cysts that required surgery.    ROS:  Chills, n/v/sweating/dizziness  PE: General: alert, well-developed, and cooperative to examination.  Head: normocephalic and atraumatic.  No tenderness to palpation of head Neck: supple, full ROM, no nuchal rigidity, no tenderness or erythema Lungs: normal respiratory effort, no accessory muscle use, normal breath sounds, no crackles, and no wheezes. Heart: normal rate, regular rhythm, no murmur, no gallop, and no rub.  Abdomen: soft, non-tender, normal bowel sounds, no distention, no guarding, no rebound tenderness  Extremities: No cyanosis, clubbing, edema Neurologic: alert & oriented X3, cranial nerves II-XII intact, strength normal in all extremities, sensation intact to light touch, and gait normal.  Skin: turgor normal and no rashes. Right thumb: small pocket of pus under epidermis s/p laceration: no erythema or drainage, + mild tenderness  Psych: Oriented X3, not depressed appearing.

## 2010-08-26 NOTE — Progress Notes (Signed)
Pt and mother aware of MRI at [facility] today 11AM - [address]. Stanton Kidney Elfrieda Espino RN  08/26/10 9:45AM

## 2010-08-26 NOTE — Patient Instructions (Signed)
We will send you for an MRI of your brain You can take Sumatriptan 50mg  one tablet for your headache, may repeat in 2 hours if headache does not resolve You can take Fioricet one tablet every 6 hours as needed for headache I will call you with the MRI results Follow up in 1 week

## 2010-08-26 NOTE — Assessment & Plan Note (Addendum)
Even though her presentation is not acute, I am concerned since patient has a history of mild chiari I malformation on MRI in 2001.  She does have headache that is associated with N/V/dizziness.  This could be migraine, but will need to rule out other intracranial abnormalities. -Will get stat MRI of brain with and without contrast  -Will try short course of sumatriptan and fioricet for her headache -If MRI is positive, will refer patient to neurosurgery  Case discussed with Dr. Phillips Odor

## 2010-08-26 NOTE — Assessment & Plan Note (Signed)
Mild pus under epidermis layer without any eythema, +mild tenderness.  Patient is clinically stable and is afebrile. -Advised patient to try warm compress at this time -If it does not resolve, will do I&D at next office visit.

## 2010-09-02 ENCOUNTER — Ambulatory Visit: Payer: Medicaid Other | Admitting: Internal Medicine

## 2010-09-04 ENCOUNTER — Telehealth: Payer: Self-pay | Admitting: *Deleted

## 2010-09-04 NOTE — Telephone Encounter (Signed)
Pt called with c/o pain to low back with radiation to leg.  Left leg feels numb in thigh area.  Onset for 3 days. Hx slipped disc.about L-7 She received a cortisone shot that has helped until now. Pt does not have ortho MD  Will see tomorrow at 1000

## 2010-09-05 ENCOUNTER — Ambulatory Visit (INDEPENDENT_AMBULATORY_CARE_PROVIDER_SITE_OTHER): Payer: Medicaid Other | Admitting: Internal Medicine

## 2010-09-05 ENCOUNTER — Encounter: Payer: Self-pay | Admitting: Internal Medicine

## 2010-09-05 VITALS — BP 126/90 | HR 76 | Temp 97.6°F | Ht 65.0 in | Wt 267.7 lb

## 2010-09-05 DIAGNOSIS — F330 Major depressive disorder, recurrent, mild: Secondary | ICD-10-CM

## 2010-09-05 DIAGNOSIS — R51 Headache: Secondary | ICD-10-CM

## 2010-09-05 MED ORDER — SUMATRIPTAN SUCCINATE 50 MG PO TABS: ORAL_TABLET | ORAL | Status: DC

## 2010-09-05 MED ORDER — TRAMADOL HCL 50 MG PO TABS: 50.0000 mg | ORAL_TABLET | Freq: Four times a day (QID) | ORAL | Status: DC | PRN

## 2010-09-05 NOTE — Patient Instructions (Signed)
Please return to clinic in 3 weeks.  We are referring you to see a neurologist.

## 2010-09-05 NOTE — Progress Notes (Signed)
Called to pharm 

## 2010-09-05 NOTE — Assessment & Plan Note (Addendum)
Uncertain of the etiology of her chronic headache exacerbation.  Certain aspects of her headaches are not very typical of migraines (the nearly constant nature, burning and sharp aspects of its quality, and associated dizzyness).  However, her visual symptoms, unilateral nature of headache, and response to triptan are fairly characteristic of migraines.  These worsened occipital headaches may alternatively be solely related to her chiari malformation, although MRI findings are unchanged since 2006.  Tension headaches are also still in the differential due to her point tenderness.    Pain management plan:  Tramadol 50mg  Q6HPRN #80 given today.  Stop butalbital-acetaminophen-caffeine.   Imitrex 50mg  #7 given today, instructed to take one and if headache returns after two hours she may take another.  Limited supply given since I cannot find any research showing safety of treating >4 headaches per month with triptans.  We are referring her for a visit to a neurologist to help determine the etiology of these headaches.    Other specific questions for the consulting neurologist: If these headaches are likely migraines, would you recommend starting prophylactic therapy (Beta blocker vs. Anti-epileptic)?   What would you recommend as maximal triptan use per month? If you believe these headaches are from her chiari malformation, is there a surgical option for this patient?

## 2010-09-05 NOTE — Assessment & Plan Note (Addendum)
Depressed mood doing better according to pt.  No significant anhedonia.  No self-harm thoughts.  Will continue lexapro.
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
## 2010-09-05 NOTE — Progress Notes (Deleted)
  Subjective:    Patient ID: Martha Mathews, female    DOB: January 26, 1986, 25 y.o.   MRN: [id number]  HPI    Review of Systems     Objective:   Physical Exam        Assessment & Plan:

## 2010-09-06 NOTE — Progress Notes (Signed)
I precepted this patient with Dr. Yaakov Guthrie.  Agree with management.  Good note

## 2010-09-19 ENCOUNTER — Telehealth: Payer: Self-pay | Admitting: *Deleted

## 2010-09-19 NOTE — Telephone Encounter (Signed)
Pt states she has abd pain, scale 5/10, since this am also feels weak and tired and has tingly arms and legs. appt is made for 8/3 at 1045 per sharonb.

## 2010-09-19 NOTE — Telephone Encounter (Signed)
I agree. Thank you.

## 2010-09-20 ENCOUNTER — Ambulatory Visit: Payer: Medicaid Other | Admitting: Internal Medicine

## 2010-09-23 ENCOUNTER — Telehealth: Payer: Self-pay | Admitting: Internal Medicine

## 2010-09-23 MED ORDER — ONDANSETRON HCL 4 MG PO TABS: 4.0000 mg | ORAL_TABLET | Freq: Three times a day (TID) | ORAL | Status: DC | PRN

## 2010-09-23 NOTE — Telephone Encounter (Signed)
Patient called for c/o and discomfort and nausea and vomiting for past 2 days. No outside food, new meds, travel. No hemetemsis. Is able to keep some liquids down. Also is taking her meds. Will give Rx for zafran and instruction to keep hydrated. Asked her to hold off on tramadol as this is the only new meds. If not better by tomorrow am, come to the ED or call the clinic for an early appointment. Next scheduled appointment on 8/18

## 2010-09-26 ENCOUNTER — Telehealth: Payer: Self-pay | Admitting: *Deleted

## 2010-09-26 NOTE — Telephone Encounter (Signed)
Pt calls and states she is no better from 8/6, she states she cannot keep anything down when she eats or drinks, she has been this way for appr 24 hrs. She has used zofran w/ no relief. She is asked to go to ED asap. She states she has appt 8/18 but she is instructed that she needs to be seen before then and referred to ED, she is agreeable and states she will go now.

## 2010-10-02 ENCOUNTER — Encounter: Payer: Self-pay | Admitting: Internal Medicine

## 2010-10-02 ENCOUNTER — Ambulatory Visit (INDEPENDENT_AMBULATORY_CARE_PROVIDER_SITE_OTHER): Payer: Medicaid Other | Admitting: Internal Medicine

## 2010-10-02 VITALS — BP 122/86 | HR 77 | Temp 99.2°F | Ht 66.0 in | Wt 258.7 lb

## 2010-10-02 DIAGNOSIS — J029 Acute pharyngitis, unspecified: Secondary | ICD-10-CM | POA: Insufficient documentation

## 2010-10-02 NOTE — Assessment & Plan Note (Signed)
Symptoms and patient's physical exam findings most consistent with acute pharyngitis in combination with sinusitis. She is mildly febrile in clinic and appears sick on examination with no hemodynamic instability. We will treat with course of antibiotics Augmentin for 10 days. Patient was advised to avoid sick contacts and exposures, bedrest as needed, hydration with IV fluids, antibiotics as prescribed. She was also advised to call clinic if her symptoms do not improve or get worse over the next 2-3 days.

## 2010-10-02 NOTE — Progress Notes (Signed)
  Subjective:    Patient ID: Martha Mathews, female    DOB: 29-Apr-1985, 25 y.o.   MRN: [id number]  Sore Throat    Patient is a 25 year old female with past medical history outlined below who presents clinically Mathews concern of one week duration sore throat associated with fevers and chills, sinus tenderness, ringing in the ears, generalized headaches, painful swallowing. She denies similar episodes in the past but reports being around sick children for past 3 weeks. She denies recent sicknesses or hospitalizations, no episodes of chest pain or shortness of breath, she does report a nonproductive cough for approximately 2 weeks in duration. She denies other systemic symptoms weight loss and night sweats.   Review of Systems Per history of present illness    Objective:   Physical Exam  Constitutional: She appears well-developed and well-nourished. No distress.  HENT:  Head: Normocephalic. No trismus in the jaw.  Right Ear: Hearing, tympanic membrane, external ear and ear canal normal.  Left Ear: Hearing, tympanic membrane, external ear and ear canal normal.  Nose: Rhinorrhea present. No sinus tenderness, nasal deformity or septal deviation. No epistaxis.  No foreign bodies. Right sinus exhibits maxillary sinus tenderness and frontal sinus tenderness. Left sinus exhibits maxillary sinus tenderness and frontal sinus tenderness.  Mouth/Throat: Mucous membranes are not pale, dry and not cyanotic. She does not have dentures. No oral lesions. Normal dentition. Uvula swelling present. No dental abscesses, lacerations or dental caries. Posterior oropharyngeal erythema present. No oropharyngeal exudate, posterior oropharyngeal edema or tonsillar abscesses.  Neck: Normal range of motion and full passive range of motion without pain. Normal carotid pulses, no hepatojugular reflux and no JVD present. No tracheal tenderness present. Carotid bruit is not present. No rigidity. No tracheal deviation, no edema, no  erythema and normal range of motion present. No mass and no thyromegaly present.  Pulmonary/Chest: No stridor.  Lymphadenopathy:       Head (right side): No submental, no submandibular, no preauricular and no posterior auricular adenopathy present.       Head (left side): No submental, no submandibular, no preauricular and no posterior auricular adenopathy present.    She has cervical adenopathy.       Right cervical: Posterior cervical adenopathy present.       Left cervical: Posterior cervical adenopathy present.  Skin: She is not diaphoretic.          Assessment & Plan:

## 2010-10-04 ENCOUNTER — Ambulatory Visit: Payer: Medicaid Other | Admitting: Internal Medicine

## 2010-10-04 ENCOUNTER — Encounter: Payer: Medicaid Other | Admitting: Ophthalmology

## 2010-10-04 ENCOUNTER — Encounter: Payer: Medicaid Other | Admitting: Internal Medicine

## 2010-10-23 ENCOUNTER — Encounter: Payer: Self-pay | Admitting: Ophthalmology

## 2010-10-23 ENCOUNTER — Ambulatory Visit (INDEPENDENT_AMBULATORY_CARE_PROVIDER_SITE_OTHER): Payer: Medicaid Other | Admitting: Ophthalmology

## 2010-10-23 DIAGNOSIS — Z0000 Encounter for general adult medical examination without abnormal findings: Secondary | ICD-10-CM

## 2010-10-23 DIAGNOSIS — Z3169 Encounter for other general counseling and advice on procreation: Secondary | ICD-10-CM | POA: Insufficient documentation

## 2010-10-23 DIAGNOSIS — F330 Major depressive disorder, recurrent, mild: Secondary | ICD-10-CM

## 2010-10-23 DIAGNOSIS — R51 Headache: Secondary | ICD-10-CM

## 2010-10-23 DIAGNOSIS — G44209 Tension-type headache, unspecified, not intractable: Secondary | ICD-10-CM

## 2010-10-23 NOTE — Assessment & Plan Note (Signed)
Patient has not had pap smear in 5-6 years. Referred to [facility] for pap smear and for concerns of infertility.
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
## 2010-10-23 NOTE — Assessment & Plan Note (Signed)
Pain is well controlled with ultram as needed when headache starts. Controls severity.

## 2010-10-23 NOTE — Patient Instructions (Signed)
If mood changes dramatically please call clinic.

## 2010-10-23 NOTE — Assessment & Plan Note (Signed)
Pain is well controlled with ultram as needed when headache starts. Controls severity. 

## 2010-10-23 NOTE — Assessment & Plan Note (Addendum)
Reassured patient that she should not be worried about infertility with 3 months of attempt. Can follow at [facility] for fertility testing if indicated in coming months. Should test for RPR/HIV at next visit.

## 2010-10-23 NOTE — Assessment & Plan Note (Addendum)
Instructed patient to increase dose to 20mg  of lexapro daily. Since she already has 10mg  pills told to take 2 pills. Referred to [facility] for restarting counseling. If patient cannot be referred there will meet with Dorothe Pea to figure out appropriate referral.

## 2010-10-24 ENCOUNTER — Telehealth: Payer: Self-pay | Admitting: *Deleted

## 2010-10-24 NOTE — Telephone Encounter (Signed)
Attempts to call pt to notify her of a GYN appointment.  Pt has an appointment with Eveline Keto, PA on 11/04/2010 at 9:30 AM  [facility] [place].   Letter sent to pt to notify her of the appointment.

## 2010-10-26 ENCOUNTER — Telehealth: Payer: Self-pay | Admitting: Internal Medicine

## 2010-10-26 NOTE — Telephone Encounter (Signed)
Patient called for a new onset of skin lesion on ankle and right elbow. Patient described it at red swelling with bite like looking in the center. The surrounding is swollen and hard and painful. It is furthermore very pruritic . Patient noted she woke up this morning and noticed this. She never had this before. She mentioned she is nauseated but denies any fever ( checked her temp). I referred the patient to urgent care for further evaluation. Will follow up with patient.

## 2010-10-31 ENCOUNTER — Emergency Department (HOSPITAL_COMMUNITY)
Admission: EM | Admit: 2010-10-31 | Discharge: 2010-11-01 | Disposition: A | Discharge status: Home / Self Care | Payer: Medicaid Other | Attending: Emergency Medicine | Admitting: Emergency Medicine

## 2010-10-31 ENCOUNTER — Telehealth: Payer: Self-pay | Admitting: Internal Medicine

## 2010-10-31 DIAGNOSIS — F329 Major depressive disorder, single episode, unspecified: Secondary | ICD-10-CM | POA: Insufficient documentation

## 2010-10-31 DIAGNOSIS — R197 Diarrhea, unspecified: Secondary | ICD-10-CM | POA: Insufficient documentation

## 2010-10-31 DIAGNOSIS — F3289 Other specified depressive episodes: Secondary | ICD-10-CM | POA: Insufficient documentation

## 2010-10-31 DIAGNOSIS — Z79899 Other long term (current) drug therapy: Secondary | ICD-10-CM | POA: Insufficient documentation

## 2010-10-31 DIAGNOSIS — R112 Nausea with vomiting, unspecified: Secondary | ICD-10-CM | POA: Insufficient documentation

## 2010-10-31 LAB — DIFFERENTIAL
Basophils Absolute: 0 10*3/uL (ref 0.0–0.1)
Basophils Relative: 0 % (ref 0–1)
Eosinophils Absolute: 0 10*3/uL (ref 0.0–0.7)
Eosinophils Relative: 0 % (ref 0–5)
Lymphocytes Relative: 11 % — ABNORMAL LOW (ref 12–46)
Lymphs Abs: 1.4 10*3/uL (ref 0.7–4.0)
Monocytes Absolute: 0.8 10*3/uL (ref 0.1–1.0)
Monocytes Relative: 6 % (ref 3–12)
Neutro Abs: 10.7 10*3/uL — ABNORMAL HIGH (ref 1.7–7.7)
Neutrophils Relative %: 83 % — ABNORMAL HIGH (ref 43–77)

## 2010-10-31 LAB — BASIC METABOLIC PANEL
BUN: 8 mg/dL (ref 6–23)
CO2: 27 mEq/L (ref 19–32)
Calcium: 9.3 mg/dL (ref 8.4–10.5)
Creatinine, Ser: 0.77 mg/dL (ref 0.50–1.10)
GFR calc non Af Amer: >60 mL/min (ref >60)
Glucose, Bld: 103 mg/dL — ABNORMAL HIGH (ref 70–99)

## 2010-10-31 LAB — CBC
HCT: 42.2 % (ref 36.0–46.0)
MCHC: 34.6 g/dL (ref 30.0–36.0)
MCV: 81.2 fL (ref 78.0–100.0)
RDW: 13.7 % (ref 11.5–15.5)

## 2010-10-31 NOTE — Telephone Encounter (Signed)
Pt reports 2 days of abdominal pain, nausea, vomiting, and watery diarrhea.  States she is unable to tolerate anything by mouth, including fluids.  Denies fever but admits to chills and "shakes."  Advised pt to go to the nearest ER for further evaluation and treatment.

## 2010-11-01 LAB — URINALYSIS, ROUTINE W REFLEX MICROSCOPIC
Bilirubin Urine: NEGATIVE
Glucose, UA: NEGATIVE mg/dL
Hgb urine dipstick: NEGATIVE
Ketones, ur: NEGATIVE mg/dL
Leukocytes, UA: NEGATIVE
Protein, ur: NEGATIVE mg/dL
pH: 6.5 (ref 5.0–8.0)

## 2010-11-01 LAB — HEPATIC FUNCTION PANEL
ALT: 29 U/L (ref 0–35)
AST: 20 U/L (ref 0–37)
Alkaline Phosphatase: 68 U/L (ref 39–117)
Bilirubin, Direct: 0.2 mg/dL (ref 0.0–0.3)

## 2010-11-08 LAB — POCT URINALYSIS DIP (DEVICE)
Bilirubin Urine: NEGATIVE
Specific Gravity, Urine: 1.025
pH: 7

## 2010-11-08 LAB — WET PREP, GENITAL
Trich, Wet Prep: NONE SEEN
Yeast Wet Prep HPF POC: NONE SEEN

## 2010-11-08 LAB — POCT PREGNANCY, URINE: Operator id: 235561

## 2010-11-08 LAB — GC/CHLAMYDIA PROBE AMP, GENITAL: GC Probe Amp, Genital: NEGATIVE

## 2010-11-17 ENCOUNTER — Inpatient Hospital Stay (HOSPITAL_COMMUNITY)
Admission: AD | Admit: 2010-11-17 | Discharge: 2010-11-18 | Disposition: A | Discharge status: Home / Self Care | Payer: Medicaid Other | Source: Ambulatory Visit | Attending: Obstetrics & Gynecology | Admitting: Obstetrics & Gynecology

## 2010-11-17 ENCOUNTER — Encounter (HOSPITAL_COMMUNITY): Payer: Self-pay | Admitting: *Deleted

## 2010-11-17 DIAGNOSIS — B9689 Other specified bacterial agents as the cause of diseases classified elsewhere: Secondary | ICD-10-CM | POA: Insufficient documentation

## 2010-11-17 DIAGNOSIS — N760 Acute vaginitis: Secondary | ICD-10-CM | POA: Insufficient documentation

## 2010-11-17 DIAGNOSIS — A499 Bacterial infection, unspecified: Secondary | ICD-10-CM | POA: Insufficient documentation

## 2010-11-17 DIAGNOSIS — K5900 Constipation, unspecified: Secondary | ICD-10-CM

## 2010-11-17 LAB — URINALYSIS, ROUTINE W REFLEX MICROSCOPIC
Glucose, UA: NEGATIVE mg/dL
Ketones, ur: 15 mg/dL — AB
Protein, ur: NEGATIVE mg/dL
pH: 6 (ref 5.0–8.0)

## 2010-11-17 LAB — POCT PREGNANCY, URINE: Preg Test, Ur: NEGATIVE

## 2010-11-17 NOTE — Progress Notes (Signed)
Note 40cc per cath ua

## 2010-11-17 NOTE — Progress Notes (Signed)
Pt sttes she had a BM yesterday that was "very hard"-also sttes she has not urinated since 0300 last night

## 2010-11-17 NOTE — Progress Notes (Signed)
bladeder scan done and pt has 36cc in the bladder-c/o of abd tenderness with the scan

## 2010-11-18 LAB — WET PREP, GENITAL

## 2010-11-18 MED ORDER — FLEET ENEMA 7-19 GM/118ML RE ENEM
1.0000 | ENEMA | Freq: Once | RECTAL | Status: AC
  Administered 2010-11-18: 1 via RECTAL

## 2010-11-18 MED ORDER — METRONIDAZOLE 500 MG PO TABS: 500.0000 mg | ORAL_TABLET | Freq: Two times a day (BID) | ORAL | Status: AC

## 2010-11-18 NOTE — ED Provider Notes (Signed)
History     Chief Complaint  Patient presents with  . Constipation   HPI  Pt is not pregnant and presents with constipation and difficulty urinating.  Pt had gastroenteritis with dehydration last week and was given IVF and antiemetics- not sure what names.  Pt has had small hard   Past Medical History  Diagnosis Date  . Depression     took lexapro for 6 months stopped taking about one year ago  . 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 Chronic kidney disease     Past Surgical History  Procedure Date  . Cesarean section   . Cholecystectomy   . Myringotomy   . Tonsillectomy   . Induced abortion     vaccum on oct 2009    Family History  Problem Relation Age of Onset  . Cancer Maternal Grandmother     Breast cancer  . Cancer Paternal Grandmother     Breast cancer  . Stroke Paternal Grandmother 37  . Diabetes Cousin   . Hypertension Cousin     History  Substance Use Topics  . Smoking status: Never Smoker   . Smokeless tobacco: Not on file  . Alcohol Use: Yes     on the w/e    Allergies:  Allergies  Allergen Reactions  . Hydrocodone-Acetaminophen     REACTION: rash and itching    Prescriptions prior to admission  Medication Sig Dispense Refill  . escitalopram (LEXAPRO) 10 MG tablet Take 20 mg by mouth daily.        . traMADol (ULTRAM) 50 MG tablet Take 1 tablet (50 mg total) by mouth every 6 (six) hours as needed for pain.  80 tablet  0  . ondansetron (ZOFRAN) 4 MG tablet Take 1 tablet (4 mg total) by mouth every 8 (eight) hours as needed for nausea.  30 tablet  0    Review of Systems  Constitutional: Negative for fever and chills.  Gastrointestinal: Positive for nausea, abdominal pain and constipation.  Genitourinary: Negative for dysuria and urgency.   Physical Exam   Blood pressure 126/69, pulse 81, temperature 98.9 F (37.2 C), temperature source Oral, resp. rate 16, height 5\' 4"  (1.626 m), weight 251 lb (113.853 kg), last menstrual  period 11/04/2010.  Physical Exam  Vitals reviewed. Constitutional: She is oriented to person, place, and time. She appears well-developed and well-nourished.  HENT:  Head: Normocephalic.  Eyes: Pupils are equal, round, and reactive to light.  Neck: Normal range of motion. Neck supple.  Cardiovascular: Normal rate.   GI: Soft. She exhibits no distension. There is no tenderness. There is no rebound.       Decreased bowel sounds  Genitourinary:       Small amount of creamy white discharge in vault- cervix not visualized due to habitus and limitations of speculum available- blind GC/Chlamydia; bimanual mod-large amount of stool palpated in rectum with bimanual; cervix nontender; uterus nontender however exam limited due to habitus  Musculoskeletal: Normal range of motion.  Neurological: She is alert and oriented to person, place, and time.  Skin: Skin is warm and dry.  Psychiatric: She has a normal mood and affect.    MAU Course  Procedures Pelvic exam with wet prep and GCChlamydia taken Fleets enema given with mod amount of stool evacuated and some relief of pain Soap Suds enema ordered but not available in the hospital    Assessment and Plan  Constipation- pt to do another enema at  home Discussed constipation- recommended Miralax, stool softners- information given on fecal impaction and constipation Bacterial vaginosis- prescription for Flagyl   Martha Mathews 11/18/2010, 2:34 AM

## 2010-11-19 LAB — GC/CHLAMYDIA PROBE AMP, GENITAL: Chlamydia, DNA Probe: NEGATIVE

## 2010-11-20 ENCOUNTER — Other Ambulatory Visit: Payer: Self-pay | Admitting: *Deleted

## 2010-11-20 MED ORDER — ESCITALOPRAM OXALATE 20 MG PO TABS: 20.0000 mg | ORAL_TABLET | Freq: Every day | ORAL | Status: DC

## 2010-11-20 NOTE — ED Provider Notes (Signed)
Agree with above note.  LEGGETT,KELLY H. 11/20/2010 5:17 AM

## 2010-12-27 ENCOUNTER — Other Ambulatory Visit: Payer: Self-pay | Admitting: *Deleted

## 2010-12-27 DIAGNOSIS — R51 Headache: Secondary | ICD-10-CM

## 2010-12-27 MED ORDER — TRAMADOL HCL 50 MG PO TABS: 50.0000 mg | ORAL_TABLET | Freq: Four times a day (QID) | ORAL | Status: DC | PRN

## 2010-12-31 ENCOUNTER — Encounter: Payer: Self-pay | Admitting: Ophthalmology

## 2010-12-31 ENCOUNTER — Ambulatory Visit (INDEPENDENT_AMBULATORY_CARE_PROVIDER_SITE_OTHER): Payer: Medicaid Other | Admitting: Ophthalmology

## 2010-12-31 VITALS — BP 129/86 | HR 77 | Temp 97.9°F | Ht 64.0 in | Wt 261.3 lb

## 2010-12-31 DIAGNOSIS — G4700 Insomnia, unspecified: Secondary | ICD-10-CM | POA: Insufficient documentation

## 2010-12-31 DIAGNOSIS — K5900 Constipation, unspecified: Secondary | ICD-10-CM | POA: Insufficient documentation

## 2010-12-31 DIAGNOSIS — R51 Headache: Secondary | ICD-10-CM

## 2010-12-31 DIAGNOSIS — Z3169 Encounter for other general counseling and advice on procreation: Secondary | ICD-10-CM

## 2010-12-31 DIAGNOSIS — Z23 Encounter for immunization: Secondary | ICD-10-CM

## 2010-12-31 DIAGNOSIS — F330 Major depressive disorder, recurrent, mild: Secondary | ICD-10-CM

## 2010-12-31 MED ORDER — TRAMADOL HCL 50 MG PO TABS: 50.0000 mg | ORAL_TABLET | Freq: Four times a day (QID) | ORAL | Status: DC | PRN

## 2010-12-31 NOTE — Patient Instructions (Signed)
Please go for sleep study. Try to keep healthy snacks and drinks around and avoid sugary drinks and high calorie snacks. Contact ob/gyn for preconception visit and fertility, pap smear.

## 2010-12-31 NOTE — Assessment & Plan Note (Signed)
Patient has established care at therapy/psychiatry center and is being seen there on a regular basis. She has been started on abilify as well. Will monitor for further weight gain as this is a known side effect of the medicine.

## 2010-12-31 NOTE — Assessment & Plan Note (Signed)
Ultram is working well when taken on an as needed basis for headaches. Refilled prescription today.

## 2010-12-31 NOTE — Assessment & Plan Note (Addendum)
Patient's depression seems to be better controlled so there could be another cause of her insomnia which is both difficulty falling asleep and difficulty staying asleep. She does have risk factors for sleep apnea: obesity, snoring, AM headaches and family history or OSA. Referred her for sleep study today since she now has medicaid. Advised her on good sleep hygiene and mentioned that she could use benadryl over the counter for help with sleep but it only works when taken every other day and loses effect when taken daily. Advised her that melatonin has not been shown to work in studies. She could also be experiencing a side effect of abilify which can cause insomnia.

## 2010-12-31 NOTE — Assessment & Plan Note (Signed)
On chart review patient was seen in ED for constipation and was treated with enema. She was advised to use miralax at home regularly. Please check with her at next visit.

## 2010-12-31 NOTE — Assessment & Plan Note (Signed)
Patient will call [facility] to make an appt today and will follow up there for routine gynecologic care (pap smear) as well as preconception/fertility testing. Opted not to draw HIV/RPR today since gynecologists may not have access to results and may redraw them at her 1st visit there.

## 2010-12-31 NOTE — Progress Notes (Signed)
Subjective:   Patient ID: DRU LAUREL female   DOB: 30-Jul-1985 25 y.o.   MRN: [id number]  HPI: Martha Mathews is a 25 y.o. woman with depression/bipolar disorder with morbid obesity who presents for 2 month follow up.  Mood: Psychiatrist mentioned that patient may have bipolar disorder. She is being seen at [facility] center- going for counseling once a week and psychiatry every 2 weeks, and is now on abilify, started there since her last appt here. Son is also on new medications and is going to play groups there. Not feeling as short tempered or as sad as she has felt in the past. Not snapping at others very often and is feeling sad only  a couple of days a week.  Ob/Gyn: wasn't able to get to appt in September since she didn't have medicaid. Was wondering if there were tests to see if she is fertile. Consider checking HIV/Syphilis today since she is planning to get pregnant. Due for pap smear as well.  Insomnia: Patient is having trouble falling asleep. Has tried melatonin with no change is symptoms. Caffeine intake- no coffee, tea. Not much soda. Snores, wakes up a couple of nights a week coughing or feeling like she has something on her chest. Doesn't feel rested, falls asleep watching TV, doesn't drive. Has morning headaches. Mom says she is a restless sleeper.  If she wakes up, has trouble falling back asleep. Is awake for several hours about 2 nights/week. Hasn't had sleep study. Her mom has CPAP, it helps her sleep better.  Past Medical History  Diagnosis Date  . Depression     took lexapro for 6 months stopped taking about one year ago  . 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 Chronic kidney disease    Current Outpatient Prescriptions  Medication Sig Dispense Refill  . ARIPiprazole (ABILIFY) 5 MG tablet Take 5 mg by mouth daily. [facility] by [facility]       . escitalopram (LEXAPRO) 20 MG tablet Take 1 tablet (20 mg total) by  mouth daily.  30 tablet  2  . traMADol (ULTRAM) 50 MG tablet Take 1 tablet (50 mg total) by mouth every 6 (six) hours as needed for pain.  80 tablet  0   Family History  Problem Relation Age of Onset  . Cancer Maternal Grandmother     Breast cancer  . Cancer Paternal Grandmother     Breast cancer  . Stroke Paternal Grandmother 35  . Diabetes Cousin   . Hypertension Cousin    History   Social History  . Marital Status: Single    Spouse Name: N/A    Number of Children: N/A  . Years of Education: N/A   Social History Main Topics  . Smoking status: Never Smoker   . Smokeless tobacco: None  . Alcohol Use: Yes     on the w/e  . Drug Use: No  . Sexually Active: Yes   Other Topics Concern  . None   Social History Narrative   Regular exercise- yes   Review of Systems: Respiratory: Reports snoring. Neurological: Reports AM headaches.  Psychiatric/Behavioral: Reports sleep disturbance   Objective:  Physical Exam: Filed Vitals:   12/31/10 0829  BP: 129/86  Pulse: 77  Temp: 97.9 F (36.6 C)  TempSrc: Oral  Height: 5\' 4"  (1.626 m)  Weight: 261 lb 4.8 oz (118.525 kg)   Constitutional: Vital signs reviewed.  Patient is an obese  woman in no acute distress and cooperative with exam. Eyes: PERRL, EOMI, conjunctivae normal, No scleral icterus. Appears tired.  Cardiovascular: RRR, S1 normal, S2 normal, no MRG. Pulmonary/Chest: CTAB, no wheezes, rales, or rhonchi Abdominal: Soft. Non-tender, non-distended, bowel sounds are normal. Neurological: A&O x3, cranial nerve II-XII are grossly intact, no focal motor deficit.   Skin: Warm, dry and intact. No rash, cyanosis, or clubbing.  Psychiatric: Normal mood, affect is slightly restricted  Speech and behavior is normal. Judgment and thought content normal. Cognition and memory are normal.   Assessment & Plan:

## 2011-01-06 ENCOUNTER — Ambulatory Visit (HOSPITAL_BASED_OUTPATIENT_CLINIC_OR_DEPARTMENT_OTHER): Payer: Medicaid Other | Attending: Ophthalmology | Admitting: Radiology

## 2011-01-06 VITALS — Ht 64.0 in | Wt 245.0 lb

## 2011-01-06 DIAGNOSIS — G4733 Obstructive sleep apnea (adult) (pediatric): Secondary | ICD-10-CM

## 2011-01-06 DIAGNOSIS — R0609 Other forms of dyspnea: Secondary | ICD-10-CM | POA: Insufficient documentation

## 2011-01-06 DIAGNOSIS — R0989 Other specified symptoms and signs involving the circulatory and respiratory systems: Secondary | ICD-10-CM | POA: Insufficient documentation

## 2011-01-06 DIAGNOSIS — G4700 Insomnia, unspecified: Secondary | ICD-10-CM

## 2011-01-06 NOTE — Progress Notes (Signed)
Addended by: Angelina Ok F on: 01/06/2011 12:47 PM   Modules accepted: Orders

## 2011-01-11 DIAGNOSIS — R0609 Other forms of dyspnea: Secondary | ICD-10-CM

## 2011-01-11 DIAGNOSIS — R0989 Other specified symptoms and signs involving the circulatory and respiratory systems: Secondary | ICD-10-CM

## 2011-01-11 DIAGNOSIS — G4733 Obstructive sleep apnea (adult) (pediatric): Secondary | ICD-10-CM

## 2011-01-11 NOTE — Procedures (Signed)
NAME:  Martha Mathews, Martha Mathews               ACCOUNT NO.:  [id number]  MEDICAL RECORD NO.:  [id number]          PATIENT TYPE:  OUT  LOCATION:  SLEEP CENTER                 FACILITY:  [facility]  PHYSICIAN:  Clinton D. Maple Hudson, MD, FCCP, FACPDATE OF BIRTH:  10/14/85  DATE OF STUDY:  01/06/2011                           NOCTURNAL POLYSOMNOGRAM  REFERRING PHYSICIAN:  Margorie John, MD  REFERRING PHYSICIAN:  Margorie John, MD  INDICATION FOR STUDY:  Insomnia with sleep apnea.  EPWORTH SLEEPINESS SCORE:  10/24.  BMI 42.1, weight 245 pounds, height 64 inches, neck 16.5 inches.  HOME MEDICATIONS:  Charted and reviewed.  SLEEP ARCHITECTURE:  Total sleep time 424.5 minutes with sleep efficiency 98%.  Stage I was 1.2%, stage II 67%, stage III 6.2%, REM 25.6% of total sleep time.  Sleep latency 6 minutes, REM latency 82 minutes.  Awake after sleep onset 2.5 minutes.  Arousal index 5.7.  BEDTIME MEDICATION:  None.  RESPIRATORY DATA:  Apnea/hypopnea index (AHI) 5.7 per hour.  A total of 40 events were scored including 2 obstructive apneas, 4 central apneas, 34 hypopneas.  Most events were associated with supine sleep position and REM.  REM AHI 16.6 per hour.  There were insufficient numbers of events to qualify for initiation of split protocol, CPAP titration on this study night.  OXYGEN DATA:  Moderate snoring with oxygen desaturation to a nadir of 83% and a mean oxygen saturation through the study of 95.5% on room air.  CARDIAC DATA:  Normal sinus rhythm.  MOVEMENT-PARASOMNIA:  No significant movement disturbance.  No bathroom trips.  IMPRESSION-RECOMMENDATION:  Minimal obstructive sleep apnea/hypopnea syndrome, apnea/hypopnea index 5.7 per hour; (the normal range for adult is from 0-5 events per hour).  Moderate snoring with oxygen desaturation to a nadir of 83% and a mean oxygen saturation through the study of 95.5% on room air.  A total of 2 minutes was recorded with room air oxygen saturation  less than 90% during the total recording time.     Clinton D. Maple Hudson, MD, [facility], FACP Diplomate, Biomedical engineer of Sleep Medicine Electronically Signed    CDY/MEDQ  D:  01/11/2011 08:23:11  T:  01/11/2011 08:34:16  Job:  [id number]

## 2011-02-21 ENCOUNTER — Encounter: Payer: Self-pay | Admitting: Ophthalmology

## 2011-02-25 ENCOUNTER — Encounter: Payer: Self-pay | Admitting: Ophthalmology

## 2011-02-27 ENCOUNTER — Emergency Department (HOSPITAL_COMMUNITY)
Admission: EM | Admit: 2011-02-27 | Discharge: 2011-02-27 | Disposition: A | Discharge status: Home / Self Care | Payer: Medicaid Other | Attending: Emergency Medicine | Admitting: Emergency Medicine

## 2011-02-27 ENCOUNTER — Emergency Department (HOSPITAL_COMMUNITY): Payer: Medicaid Other

## 2011-02-27 ENCOUNTER — Encounter (HOSPITAL_COMMUNITY): Payer: Self-pay | Admitting: *Deleted

## 2011-02-27 DIAGNOSIS — N189 Chronic kidney disease, unspecified: Secondary | ICD-10-CM | POA: Insufficient documentation

## 2011-02-27 DIAGNOSIS — F329 Major depressive disorder, single episode, unspecified: Secondary | ICD-10-CM | POA: Insufficient documentation

## 2011-02-27 DIAGNOSIS — F3289 Other specified depressive episodes: Secondary | ICD-10-CM | POA: Insufficient documentation

## 2011-02-27 DIAGNOSIS — IMO0002 Reserved for concepts with insufficient information to code with codable children: Secondary | ICD-10-CM | POA: Insufficient documentation

## 2011-02-27 DIAGNOSIS — M79609 Pain in unspecified limb: Secondary | ICD-10-CM | POA: Insufficient documentation

## 2011-02-27 DIAGNOSIS — S60222A Contusion of left hand, initial encounter: Secondary | ICD-10-CM

## 2011-02-27 DIAGNOSIS — S60229A Contusion of unspecified hand, initial encounter: Secondary | ICD-10-CM | POA: Insufficient documentation

## 2011-02-27 DIAGNOSIS — Z79899 Other long term (current) drug therapy: Secondary | ICD-10-CM | POA: Insufficient documentation

## 2011-02-27 MED ORDER — IBUPROFEN 800 MG PO TABS
800.0000 mg | ORAL_TABLET | Freq: Once | ORAL | Status: AC
  Administered 2011-02-27: 800 mg via ORAL
  Filled 2011-02-27: qty 1

## 2011-02-27 MED ORDER — IBUPROFEN 400 MG PO TABS: 400.0000 mg | ORAL_TABLET | Freq: Four times a day (QID) | ORAL | Status: AC | PRN
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
## 2011-02-27 NOTE — ED Provider Notes (Signed)
Medical screening examination/treatment/procedure(s) were performed by non-physician practitioner and as supervising physician I was immediately available for consultation/collaboration.   Keshayla Schrum L Phoenix Riesen, MD 02/27/11 2355 

## 2011-03-14 ENCOUNTER — Encounter: Payer: Medicaid Other | Admitting: Ophthalmology

## 2011-03-17 ENCOUNTER — Emergency Department (HOSPITAL_COMMUNITY)
Admission: EM | Admit: 2011-03-17 | Discharge: 2011-03-17 | Disposition: A | Discharge status: Home / Self Care | Payer: Medicaid Other | Attending: Emergency Medicine | Admitting: Emergency Medicine

## 2011-03-17 ENCOUNTER — Encounter (HOSPITAL_COMMUNITY): Payer: Self-pay | Admitting: *Deleted

## 2011-03-17 DIAGNOSIS — E669 Obesity, unspecified: Secondary | ICD-10-CM | POA: Insufficient documentation

## 2011-03-17 DIAGNOSIS — F329 Major depressive disorder, single episode, unspecified: Secondary | ICD-10-CM | POA: Insufficient documentation

## 2011-03-17 DIAGNOSIS — Z79899 Other long term (current) drug therapy: Secondary | ICD-10-CM | POA: Insufficient documentation

## 2011-03-17 DIAGNOSIS — R51 Headache: Secondary | ICD-10-CM | POA: Insufficient documentation

## 2011-03-17 DIAGNOSIS — N189 Chronic kidney disease, unspecified: Secondary | ICD-10-CM | POA: Insufficient documentation

## 2011-03-17 DIAGNOSIS — F3289 Other specified depressive episodes: Secondary | ICD-10-CM | POA: Insufficient documentation

## 2011-03-17 MED ORDER — KETOROLAC TROMETHAMINE 30 MG/ML IJ SOLN
INTRAMUSCULAR | Status: AC
  Administered 2011-03-17: 30 mg
  Filled 2011-03-17: qty 1

## 2011-03-17 MED ORDER — METOCLOPRAMIDE HCL 5 MG/ML IJ SOLN
10.0000 mg | Freq: Once | INTRAMUSCULAR | Status: AC
  Administered 2011-03-17: 10 mg via INTRAVENOUS
  Filled 2011-03-17: qty 2

## 2011-03-17 MED ORDER — SODIUM CHLORIDE 0.9 % IV BOLUS (SEPSIS)
1000.0000 mL | Freq: Once | INTRAVENOUS | Status: AC
  Administered 2011-03-17: 1000 mL via INTRAVENOUS

## 2011-03-17 MED ORDER — KETOROLAC TROMETHAMINE 15 MG/ML IJ SOLN
15.0000 mg | Freq: Once | INTRAMUSCULAR | Status: AC
  Administered 2011-03-17: 15 mg via INTRAVENOUS
  Filled 2011-03-17: qty 1

## 2011-03-17 MED ORDER — DIPHENHYDRAMINE HCL 50 MG/ML IJ SOLN
25.0000 mg | Freq: Once | INTRAMUSCULAR | Status: AC
  Administered 2011-03-17: 25 mg via INTRAVENOUS
  Filled 2011-03-17: qty 1

## 2011-03-17 NOTE — ED Notes (Signed)
Stabbing headache. Onset 1 wk. Intermittently. Was taking Ultram that was prescribed 4-5 months ago for headaches but they no longer work. Last Ultram taken this morning. Pt has had headaches since 3 grade. She also gets migranes. This is the worst headache she has experienced.

## 2011-03-17 NOTE — ED Notes (Signed)
Headache today for 3 hours hx of migraine headaches.  She is a frequent pt here for the same.  No nv

## 2011-03-17 NOTE — ED Notes (Signed)
MD at bedside. 

## 2011-03-18 ENCOUNTER — Telehealth: Payer: Self-pay | Admitting: *Deleted

## 2011-03-18 NOTE — Telephone Encounter (Signed)
Pt called and informed.  She voices understanding. 

## 2011-03-18 NOTE — Telephone Encounter (Signed)
May take a repeat dose of imitrex.  If no relief, try tramadol.

## 2011-03-18 NOTE — Telephone Encounter (Signed)
Pt called stating she went to ED yesterday for Migraine headache, was treated and sent home.  Headache resolved. She got up today at 8:30 headache returned, she took imitrex 1 1/2 hours ago with no relief.  Pain is on top of head, stabbing, burning sensation on top of head. When she is flat she gets a pressure feeling. She is dizzy but no visual problems or nausea. Rates pain 8/10. We have no appointments available today or tomorrow.  Please advise. Pt # [id number]

## 2011-03-26 NOTE — ED Provider Notes (Signed)
History    26 year old female with headache. Has a history of what she calls migraines. Current headache is similar character to previous but worse in intensity. Denies trauma. Gradual onset. Onset was just a few hours prior to arrival. Headache is diffuse and does not lateralize. Nausea but no vomiting. No neck pain or neck stiffness. No contacts with similar. No numbness, tingling or loss of strength. No fever or chills. No change in visual acuity. No sore throat or ear pain. CSN: [id number]  Arrival date & time 03/17/11  1619   First MD Initiated Contact with Patient 03/17/11 1722      Chief Complaint  Patient presents with  . Headache    (Consider location/radiation/quality/duration/timing/severity/associated sxs/prior treatment) HPI  Past Medical History  Diagnosis Date  . Depression     took lexapro for 6 months stopped taking about one year ago  . 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 Chronic kidney disease     Past Surgical History  Procedure Date  . Cesarean section   . Cholecystectomy   . Myringotomy   . Tonsillectomy   . Induced abortion     vaccum on oct 2009    Family History  Problem Relation Age of Onset  . Cancer Maternal Grandmother     Breast cancer  . Cancer Paternal Grandmother     Breast cancer  . Stroke Paternal Grandmother 14  . Diabetes Cousin   . Hypertension Cousin     History  Substance Use Topics  . Smoking status: Never Smoker   . Smokeless tobacco: Not on file  . Alcohol Use: Yes     on the w/e    OB History    Grav Para Term Preterm Abortions TAB SAB Ect Mult Living   1 1 1       1       Review of Systems   Review of symptoms negative unless otherwise noted in HPI.   Allergies  Hydrocodone-acetaminophen  Home Medications   Current Outpatient Rx  Name Route Sig Dispense Refill  . ARIPIPRAZOLE 5 MG PO TABS Oral Take 5 mg by mouth daily. Prescribed by [facility] (mental health)    .  VITAMIN C PO Oral Take 1 each by mouth daily. Vitamin c gummy    . ESCITALOPRAM OXALATE 20 MG PO TABS Oral Take 1 tablet (20 mg total) by mouth daily. 30 tablet 2  . TRAMADOL HCL 50 MG PO TABS Oral Take 50 mg by mouth every 6 (six) hours as needed. For pain      BP 149/78  Pulse 84  Temp(Src) 98.3 F (36.8 C) (Oral)  Resp 16  SpO2 97%  LMP 02/27/2011  Physical Exam  Nursing note and vitals reviewed. Constitutional: No distress.       Sitting in bed. No acute distress. Obese.  HENT:  Head: Normocephalic and atraumatic.  Eyes: Conjunctivae and EOM are normal. Pupils are equal, round, and reactive to light. Right eye exhibits no discharge. Left eye exhibits no discharge.       Optic discs are sharp.  Neck: Neck supple.  Cardiovascular: Normal rate, regular rhythm and normal heart sounds.  Exam reveals no gallop and no friction rub.   No murmur heard. Pulmonary/Chest: Effort normal and breath sounds normal. No respiratory distress.  Abdominal: Soft. She exhibits no distension. There is no tenderness.  Musculoskeletal: She exhibits no edema and no tenderness.  Neurological: She is alert.  Good finger nose testing bilaterally. Normal-appearing gait. Speech clear and content appropriate. Visual fields intact to confrontation.  Skin: Skin is warm and dry. She is not diaphoretic.  Psychiatric: She has a normal mood and affect. Her behavior is normal. Thought content normal.    ED Course  Procedures (including critical care time)  Labs Reviewed - No data to display No results found.   1. Headache       MDM  26 year old female with headache. Suspect primary headache. Low clinical suspicion for emergent secondary causes of headache such as bleed, infectious or mass. Consider carbon monoxide poisoning with cold weather but doubt. No contacts with similar symptoms. No history of trauma. Patient does not take any blood thinning medication. Afebrile and no signs of meningismus No  visual complaints to suggest ocular etiology. Consider venous thrombosis but doubt. Consider carotid or vertebral artery dissection but doubt. Patient is clinically well appearing and has a nonfocal neurological examination. Reports feeling better after medicines. Feel that she can safely be discharged at this time.        Raeford Razor, MD 03/26/11 [phone]

## 2011-04-04 ENCOUNTER — Encounter: Payer: Medicaid Other | Admitting: Ophthalmology

## 2011-05-06 ENCOUNTER — Other Ambulatory Visit: Payer: Self-pay | Admitting: *Deleted

## 2011-05-06 DIAGNOSIS — R51 Headache: Secondary | ICD-10-CM

## 2011-05-09 MED ORDER — TRAMADOL HCL 50 MG PO TABS: 50.0000 mg | ORAL_TABLET | Freq: Four times a day (QID) | ORAL | Status: DC | PRN

## 2011-05-09 NOTE — Telephone Encounter (Signed)
I refilled tramadol for #15; patient needs an appointment - she apparently missed last 2 scheduled appointments with her PCP.

## 2011-05-30 ENCOUNTER — Encounter: Payer: Self-pay | Admitting: Internal Medicine

## 2011-05-30 ENCOUNTER — Ambulatory Visit (INDEPENDENT_AMBULATORY_CARE_PROVIDER_SITE_OTHER): Payer: Medicaid Other | Admitting: Internal Medicine

## 2011-05-30 DIAGNOSIS — G43909 Migraine, unspecified, not intractable, without status migrainosus: Secondary | ICD-10-CM

## 2011-05-30 DIAGNOSIS — G935 Compression of brain: Secondary | ICD-10-CM

## 2011-05-30 MED ORDER — KETOROLAC TROMETHAMINE 30 MG/ML IM SOLN: 30.0000 mg | Freq: Once | INTRAMUSCULAR | Status: DC

## 2011-05-30 MED ORDER — KETOROLAC TROMETHAMINE 30 MG/ML IM SOLN
30.0000 mg | Freq: Once | INTRAMUSCULAR | Status: AC
  Administered 2011-05-30: 30 mg via INTRAMUSCULAR

## 2011-05-30 MED ORDER — SUMATRIPTAN SUCCINATE 50 MG PO TABS: 50.0000 mg | ORAL_TABLET | Freq: Once | ORAL | Status: DC | PRN

## 2011-05-30 NOTE — Patient Instructions (Signed)
Please, follow up after an MRI and call with any questions.

## 2011-05-30 NOTE — Progress Notes (Signed)
Patient ID: Martha Mathews, female   DOB: [birth year], 26 y.o.   MRN: [id number] HPI:    1. HA.  Reports occipital "burning and sharp" HA of a 3 day duration that is not releived with any OTC NSAIDS and/or Tramadol. Patient reports "flashes" in both of her eyes, tinnitus, dizziness, and nausea. States that has a Hx of HA since age 81 y/o. Hx of Chiari malformation and multiple MRI's/CT's of brain in the past.  Denies  Any other concerns. Review of Systems: Negative except per history of present illness  Physical Exam:  Nursing notes and vitals reviewed General:  alert, endomorph, and cooperative to examination; in NAD.   HEENT: right occipital TTP and cervical vertebral TTP noted; strabismus of OS noted. Lungs:  normal respiratory effort, no accessory muscle use, normal breath sounds, no crackles, and no wheezes. Heart:  normal rate, regular rhythm, no murmurs, no gallop, and no rub.   Abdomen:  soft, non-tender, normal bowel sounds, no distention, no guarding, no rebound tenderness, no hepatomegaly, and no splenomegaly.   Extremities:  No cyanosis, clubbing, edema Neurologic:  alert & oriented X3, nonfocal exam  Meds: Medications Prior to Admission  Medication Sig Dispense Refill  . ARIPiprazole (ABILIFY) 5 MG tablet Take 5 mg by mouth daily. Prescribed by [facility] (mental health)      . Ascorbic Acid (VITAMIN C PO) Take 1 each by mouth daily. Vitamin c gummy      . escitalopram (LEXAPRO) 20 MG tablet Take 1 tablet (20 mg total) by mouth daily.  30 tablet  2  . SUMAtriptan (IMITREX) 50 MG tablet Take 1 tablet (50 mg total) by mouth once as needed for migraine.  30 tablet  2   Medications Prior to Admission  Medication Dose Route Frequency Provider Last Rate Last Dose  . ketorolac (TORADOL) injection 30 mg  30 mg Intramuscular Once Denna Haggard, MD   30 mg at 05/30/11 1104    Allergies: Hydrocodone-acetaminophen Past Medical History  Diagnosis Date  . Depression      took lexapro for 6 months stopped taking about one year ago  . 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 Chronic kidney disease    Past Surgical History  Procedure Date  . Cesarean section   . Cholecystectomy   . Myringotomy   . Tonsillectomy   . Induced abortion     vaccum on oct 2009   Family History  Problem Relation Age of Onset  . Cancer Maternal Grandmother     Breast cancer  . Cancer Paternal Grandmother     Breast cancer  . Stroke Paternal Grandmother 107  . Diabetes Cousin   . Hypertension Cousin    History   Social History  . Marital Status: Single    Spouse Name: N/A    Number of Children: N/A  . Years of Education: N/A   Occupational History  . Not on file.   Social History Main Topics  . Smoking status: Never Smoker   . Smokeless tobacco: Not on file  . Alcohol Use: Yes     on the w/e  . Drug Use: No  . Sexually Active: Yes   Other Topics Concern  . Not on file   Social History Narrative   Regular exercise- yes    A/P: 1. Occipital headaches with scalp neuralgia. 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 Also strabismus noted. I am not  sure whether this is related to Chiari malformation vs this is a true migraine HA with aura. - Cine phase contrast MRI to evaluate for an impairment of CSF flow through foramen magnum -will d/c tramadol and try Imitrex. If HA is controled with triptan and Cine MRI neg - patient's symptoms would be likely due to migraines. -start HA diary  And instructed to bring next OV. -Toradol 30 mg IM x1. -instructed to call 911 and/or go to ED if Sx worsen. -f/U after MRI.

## 2011-06-12 ENCOUNTER — Ambulatory Visit (HOSPITAL_COMMUNITY)
Admission: RE | Admit: 2011-06-12 | Discharge: 2011-06-12 | Disposition: A | Discharge status: Home / Self Care | Payer: Medicaid Other | Source: Ambulatory Visit | Attending: Internal Medicine | Admitting: Internal Medicine

## 2011-06-12 DIAGNOSIS — G43909 Migraine, unspecified, not intractable, without status migrainosus: Secondary | ICD-10-CM | POA: Insufficient documentation

## 2011-06-12 DIAGNOSIS — G935 Compression of brain: Secondary | ICD-10-CM | POA: Insufficient documentation

## 2011-06-13 ENCOUNTER — Encounter: Payer: Self-pay | Admitting: Ophthalmology

## 2011-06-13 ENCOUNTER — Ambulatory Visit (INDEPENDENT_AMBULATORY_CARE_PROVIDER_SITE_OTHER): Payer: Medicaid Other | Admitting: Ophthalmology

## 2011-06-13 VITALS — BP 127/89 | HR 76 | Temp 97.4°F | Ht 64.0 in | Wt 269.5 lb

## 2011-06-13 DIAGNOSIS — R51 Headache: Secondary | ICD-10-CM

## 2011-06-13 DIAGNOSIS — G932 Benign intracranial hypertension: Secondary | ICD-10-CM

## 2011-06-13 MED ORDER — TRAMADOL HCL 50 MG PO TABS: 50.0000 mg | ORAL_TABLET | Freq: Four times a day (QID) | ORAL | Status: DC | PRN

## 2011-06-13 MED ORDER — ACETAZOLAMIDE 125 MG PO TABS: 500.0000 mg | ORAL_TABLET | Freq: Two times a day (BID) | ORAL | Status: DC

## 2011-06-13 NOTE — Patient Instructions (Signed)
-  STOP taking the imitrex. START taking the acetazolamide (diamox).  -Please see Dr. Dione Booze as soon as possible- evaluate for papilledema (due to increased intracranial pressure) -Will refer to neurosurgery. If you don't hear from her is 1 week, please call the clinic to make sure we get you an appt.
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
## 2011-06-19 NOTE — Progress Notes (Signed)
Addended by: Margorie John on: 06/19/2011 11:04 AM   Modules accepted: Orders

## 2011-06-19 NOTE — Assessment & Plan Note (Addendum)
Patient's headaches seemed to be well controlled on Ultram previously but they have worsened in the past few weeks. She was trialed on immitrex since 4/12 but this has not helped and I will put her back on Ultram since her headaches are concerning for increased intracranial pressure, not for migraine. The two most likely etiologies are pseudotumor cerebri or headache due to her Chiari 1 malformation. Her symptoms fit pseudotumor quite well, she has pulsatile tinnitus and diplopia and morning headaches. However, given that Chiari 1 malformations often don't become symptomatic until young adulthood, this is a possibility as well.Her headaches are centrally located both on top and on the back of her head. Chiari 1 headaches are usually posterior or nuchal. I will refer her to neurology as well as ask her to be seen by her ophthalmologist in the next few days to assess for papilledema. Will start her on acetazolamide for likely increased intracranial pressure. If she does have pseudotumor will likely need to focus on weight loss and can try topamax. If neurologist is concerned for chiari 1 malformation, may need neurosurgical decompression or shunt. Patient's cineradiography image doesn't show syringomyelia but does show some degree of CSF flow disruption. Will send report of test to neurologist for interpretation and try to find neurologist in [facility] system if possible.

## 2011-06-25 ENCOUNTER — Emergency Department (HOSPITAL_COMMUNITY): Payer: Medicaid Other

## 2011-06-25 ENCOUNTER — Emergency Department (HOSPITAL_COMMUNITY)
Admission: EM | Admit: 2011-06-25 | Discharge: 2011-06-25 | Disposition: A | Discharge status: Home / Self Care | Payer: Medicaid Other | Attending: Emergency Medicine | Admitting: Emergency Medicine

## 2011-06-25 ENCOUNTER — Encounter (HOSPITAL_COMMUNITY): Payer: Self-pay | Admitting: *Deleted

## 2011-06-25 DIAGNOSIS — M25579 Pain in unspecified ankle and joints of unspecified foot: Secondary | ICD-10-CM | POA: Insufficient documentation

## 2011-06-25 DIAGNOSIS — W11XXXA Fall on and from ladder, initial encounter: Secondary | ICD-10-CM | POA: Insufficient documentation

## 2011-06-25 DIAGNOSIS — S93609A Unspecified sprain of unspecified foot, initial encounter: Secondary | ICD-10-CM

## 2011-06-25 DIAGNOSIS — N189 Chronic kidney disease, unspecified: Secondary | ICD-10-CM | POA: Insufficient documentation

## 2011-06-25 DIAGNOSIS — F319 Bipolar disorder, unspecified: Secondary | ICD-10-CM | POA: Insufficient documentation

## 2011-06-25 DIAGNOSIS — S93409A Sprain of unspecified ligament of unspecified ankle, initial encounter: Secondary | ICD-10-CM

## 2011-06-25 HISTORY — DX: Bipolar disorder, unspecified: F31.9

## 2011-06-25 HISTORY — DX: Reserved for concepts with insufficient information to code with codable children: IMO0002

## 2011-06-25 MED ORDER — OXYCODONE-ACETAMINOPHEN 5-325 MG PO TABS
1.0000 | ORAL_TABLET | Freq: Once | ORAL | Status: AC
  Administered 2011-06-25: 1 via ORAL
  Filled 2011-06-25: qty 1

## 2011-06-25 MED ORDER — OXYCODONE-ACETAMINOPHEN 5-325 MG PO TABS: ORAL_TABLET | ORAL | Status: DC

## 2011-06-25 MED ORDER — IBUPROFEN 800 MG PO TABS
800.0000 mg | ORAL_TABLET | Freq: Once | ORAL | Status: AC
  Administered 2011-06-25: 800 mg via ORAL
  Filled 2011-06-25: qty 1

## 2011-06-25 MED ORDER — IBUPROFEN 600 MG PO TABS
600.0000 mg | ORAL_TABLET | Freq: Three times a day (TID) | ORAL | Status: AC
Start: 2011-06-25 — End: 2011-07-05

## 2011-06-25 NOTE — ED Provider Notes (Signed)
History   This chart was scribed for [name]. Oletta Lamas, MD by Clarita Crane. The patient was seen in room STRE7/STRE7. Patient's care was started at 1828.    CSN: [id number]  Arrival date & time 06/25/11  4098   First MD Initiated Contact with Patient 06/25/11 1924      Chief Complaint  Patient presents with  . Ankle Injury    (Consider location/radiation/quality/duration/timing/severity/associated sxs/prior treatment) HPI Martha Mathews is a 26 y.o. female who presents to the Emergency Department complaining of moderate to severe left ankle injury sustained just prior to arrival when patient states she fell off a ladder, landed on the ground and heard a popping sound from her left ankle/foot as her left foot inverted. Also notes experiencing pain to lateral aspect of left foot. States pain is worse with palpation, weight bearing and movement. Denies tingling, weakness, head injury, LOC. Patient with h/o depression, chronic kidney disease, bipolar disorder, c-section, cholecystectomy.   Past Medical History  Diagnosis Date  . Depression     took lexapro for 6 months stopped taking about one year ago  . 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 Chronic kidney disease   . Bipolar 1 disorder   . Chiari malformation     Past Surgical History  Procedure Date  . Cesarean section   . Cholecystectomy   . Myringotomy   . Tonsillectomy   . Induced abortion     vaccum on oct 2009    Family History  Problem Relation Age of Onset  . Cancer Maternal Grandmother     Breast cancer  . Cancer Paternal Grandmother     Breast cancer  . Stroke Paternal Grandmother 50  . Diabetes Cousin   . Hypertension Cousin     History  Substance Use Topics  . Smoking status: Never Smoker   . Smokeless tobacco: Not on file  . Alcohol Use: Yes     on the w/e    OB History    Grav Para Term Preterm Abortions TAB SAB Ect Mult Living   1 1 1       1       Review of Systems    Constitutional: Negative for fever and chills.  HENT: Negative for congestion and neck pain.   Respiratory: Negative for shortness of breath.   Cardiovascular: Negative for chest pain and palpitations.  Gastrointestinal: Negative for nausea and vomiting.  Genitourinary: Negative for flank pain.  Musculoskeletal: Positive for joint swelling and arthralgias. Negative for back pain.       Left ankle injury.   Skin: Negative for rash and wound.  Neurological: Negative for weakness.       Negative tingling.     Allergies  Hydrocodone-acetaminophen  Home Medications   Current Outpatient Rx  Name Route Sig Dispense Refill  . ACETAZOLAMIDE 125 MG PO TABS Oral Take 4 tablets (500 mg total) by mouth 2 (two) times daily. 60 tablet 1  . ARIPIPRAZOLE 5 MG PO TABS Oral Take 5 mg by mouth daily. Prescribed by [facility] (mental health)    . VITAMIN C PO Oral Take 1 each by mouth daily. Vitamin c gummy    . ESCITALOPRAM OXALATE 20 MG PO TABS Oral Take 1 tablet (20 mg total) by mouth daily. 30 tablet 2  . IBUPROFEN 600 MG PO TABS Oral Take 1 tablet (600 mg total) by mouth 3 (three) times daily. 21 tablet 0  . OXYCODONE-ACETAMINOPHEN 5-325  MG PO TABS  1-2 tablets po q 6 hours prn moderate to severe pain 20 tablet 0    BP 153/138  Pulse 90  Temp(Src) 98.1 F (36.7 C) (Oral)  Resp 16  SpO2 98%  LMP 05/19/2011  Physical Exam  Nursing note and vitals reviewed. Constitutional: She is oriented to person, place, and time. She appears well-developed and well-nourished. No distress.  HENT:  Head: Normocephalic and atraumatic.  Eyes: EOM are normal.  Neck: Neck supple. No tracheal deviation present.  Cardiovascular: Normal rate.   Pulmonary/Chest: Effort normal. No respiratory distress.  Musculoskeletal: Normal range of motion. She exhibits tenderness.       Left ankle: She exhibits swelling. She exhibits no deformity and normal pulse. tenderness. Lateral malleolus and medial  malleolus tenderness found. Achilles tendon normal.       No proximal left fibula tenderness. Tenderness noted to left lateral malleolus with diffuse swelling. Tenderness to head of left 5th metatarsal. DP pulse intact within LLE. Left knee non tender.  Left heel non tender. Left achilles intact.  Neurological: She is alert and oriented to person, place, and time. She has normal strength. No sensory deficit.       Distal sensation intact within LLE.   Skin: Skin is warm, dry and intact. She is not diaphoretic.  Psychiatric: Her speech is normal and behavior is normal. Judgment and thought content normal. Her mood appears anxious.    ED Course  Procedures (including critical care time)  DIAGNOSTIC STUDIES: Oxygen Saturation is 98% on room air, normal by my interpretation.    COORDINATION OF CARE: 7:35PM-Patient informed of current plan for treatment and evaluation and agrees with plan at this time.     Labs Reviewed - No data to display Dg Ankle Complete Left  06/25/2011  *RADIOLOGY REPORT*  Clinical Data: Left ankle pain and swelling.  Left ankle injury.  LEFT ANKLE COMPLETE - 3+ VIEW  Comparison: 03/09/2005 and multiple priors.  Findings: Small Achilles insertional spur is incidentally noted. The ankle mortise is congruent.  The talar dome is intact.  There is exuberant lateral malleolar soft tissue swelling.  Well corticated fragment is present adjacent to the tip of the lateral malleolus compatible with an old avulsion from lateral ligament injury.  This fragment appears larger than on prior exam where there was a small fragment present.  No acute fracture is identified.  Alignment the ankle is anatomic.  IMPRESSION:  1.  Marked lateral malleolar soft tissue swelling. 2.  Old well corticated fracture fragment adjacent to the tip of the lateral malleolus.  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  Original Report Authenticated By: Andreas Newport, M.D.   Dg Foot  Complete Left  06/25/2011  *RADIOLOGY REPORT*  Clinical Data: Left ankle pain and swelling.  Foot pain.  LEFT FOOT - COMPLETE 3+ VIEW  Comparison: None.  Findings: Tiny exostosis is present off the distal lateral shaft of the small toe metatarsal.  This may represent deformity from old fracture.  No acute fracture is identified.  Soft tissues of the forefoot appear within normal limits.  IMPRESSION: No acute osseous abnormality.  Original Report Authenticated By: Andreas Newport, M.D.   I reviewed films myself.    1. Ankle sprain   2. Foot sprain     8:11 PM No fractures.  Ankle and foot sprains.  RICE at home and follow up with PCP next week prn.    MDM  I personally performed the services described in  this documentation, which was scribed in my presence. The recorded information has been reviewed and considered.    [name]. Oletta Lamas, MD 06/25/11 2013

## 2011-06-25 NOTE — ED Notes (Signed)
Ortho tech informed of orders placed. 

## 2011-06-25 NOTE — ED Notes (Signed)
C/o L ankle & foot pain, swelling present, some bruising to anterolateral ankle possible, CMS intact, ROM decreased d/t pain, tender to palpation, palpable pulses and cap refill <2sec. Occurred stepping wrong in a bounce house/out of a bounce house, occurred around 1700.

## 2011-06-25 NOTE — Discharge Instructions (Signed)

## 2011-06-25 NOTE — Progress Notes (Signed)
Orthopedic Tech Progress Note Patient Details:  Martha Mathews [birth year] [id number]  Other Ortho Devices Type of Ortho Device: ASO;Crutches;Ace wrap Ortho Device Location: (L) LE Ortho Device Interventions: Application   Jennye Moccasin 06/25/2011, 8:38 PM

## 2011-06-25 NOTE — ED Notes (Signed)
Pt states she feel off a ladder landing on left ankle wrong, states she heard a pop, pt states she can not feel pinky toe, no significant swelling noted in triage.

## 2011-06-25 NOTE — ED Notes (Signed)
Foot elevated, ice applied, pain med given, pending xray results.

## 2011-06-25 NOTE — ED Notes (Signed)
Lying supine, HOB 15 degrees, LLE elavated and iced with ASO applied, calling mother for ride.

## 2011-06-30 ENCOUNTER — Ambulatory Visit (INDEPENDENT_AMBULATORY_CARE_PROVIDER_SITE_OTHER): Payer: Medicaid Other | Admitting: Ophthalmology

## 2011-06-30 ENCOUNTER — Encounter: Payer: Self-pay | Admitting: Ophthalmology

## 2011-06-30 VITALS — BP 141/86 | HR 75 | Temp 98.1°F | Ht 64.0 in | Wt 271.7 lb

## 2011-06-30 DIAGNOSIS — W19XXXA Unspecified fall, initial encounter: Secondary | ICD-10-CM

## 2011-06-30 DIAGNOSIS — Y9344 Activity, trampolining: Secondary | ICD-10-CM

## 2011-06-30 DIAGNOSIS — S93409A Sprain of unspecified ligament of unspecified ankle, initial encounter: Secondary | ICD-10-CM

## 2011-06-30 DIAGNOSIS — R51 Headache: Secondary | ICD-10-CM

## 2011-06-30 NOTE — Progress Notes (Signed)
  Subjective:   Patient ID: Martha Mathews female   DOB: 09-09-1985 26 y.o.   MRN: [id number]  HPI: Martha Mathews is a 26 y.o. woman who was seen on Wednesday at the emergency room for ankle sprain after injury when on a trampoline. Foot rolled in and she heard a pop. Is walking with crutches, very painful to bear weight. Haven't been able to move 3 small toes since injury. Swelling is essentially unchanged.  Headaches- well controlled but taking percocet, is taking diamox twice a day as prescribed on weekends and once a day on weekends since it makes her tired. Martha Mathews wil need to call Dr. Dione Booze   Past Medical History  Diagnosis Date  . Depression     took lexapro for 6 months stopped taking about one year ago  . 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 Chronic kidney disease   . Bipolar 1 disorder   . Chiari malformation    Current Outpatient Prescriptions  Medication Sig Dispense Refill  . acetaZOLAMIDE (DIAMOX) 125 MG tablet Take 4 tablets (500 mg total) by mouth 2 (two) times daily.  60 tablet  1  . ARIPiprazole (ABILIFY) 5 MG tablet Take 5 mg by mouth daily. Prescribed by [facility] (mental health)      . Ascorbic Acid (VITAMIN C PO) Take 1 each by mouth daily. Vitamin c gummy      . escitalopram (LEXAPRO) 20 MG tablet Take 1 tablet (20 mg total) by mouth daily.  30 tablet  2  . ibuprofen (ADVIL,MOTRIN) 600 MG tablet Take 1 tablet (600 mg total) by mouth 3 (three) times daily.  21 tablet  0  . oxyCODONE-acetaminophen (PERCOCET) 5-325 MG per tablet 1-2 tablets po q 6 hours prn moderate to severe pain  20 tablet  0   Family History  Problem Relation Age of Onset  . Cancer Maternal Grandmother     Breast cancer  . Cancer Paternal Grandmother     Breast cancer  . Stroke Paternal Grandmother 74  . Diabetes Cousin   . Hypertension Cousin    History   Social History  . Marital Status: Single    Spouse Name: N/A    Number of Children: N/A  .  Years of Education: N/A   Social History Main Topics  . Smoking status: Never Smoker   . Smokeless tobacco: None  . Alcohol Use: Yes     on the w/e  . Drug Use: No  . Sexually Active: Yes   Other Topics Concern  . None   Social History Narrative   Regular exercise- yes   Objective:  Physical Exam: Filed Vitals:   06/30/11 1028  BP: 141/86  Pulse: 75  Temp: 98.1 F (36.7 C)  TempSrc: Oral  Height: 5\' 4"  (1.626 m)  Weight: 271 lb 11.2 oz (123.242 kg)   General: obese young woman sitting in chair with ankle brace on HEENT: PERRL, EOMI, no scleral icterus Ext: warm and well perfused, mild edema over left ankle, patient is quite tender to palpation of medial and lateral ankle, she denies being able to move her 3rd, 4th and 5th toes on the left side, pain is induced by passive motion of these digits.  Neuro: alert and oriented X3, cranial nerves II-XII grossly intact, sensation intact in left foot and leg  Assessment & Plan:

## 2011-06-30 NOTE — Patient Instructions (Signed)
-  Mamie will contact you with appt for Dr. Dione Booze -appt with Dr. Terrace Arabia is 11:00 (neurologist) -need CD with MRI brain from 7/12 and MR Cineradiography from 4/13

## 2011-07-01 DIAGNOSIS — S93402A Sprain of unspecified ligament of left ankle, initial encounter: Secondary | ICD-10-CM | POA: Insufficient documentation

## 2011-07-01 NOTE — Assessment & Plan Note (Signed)
Patient is having continued tenderness and swelling of ankle and is using crutches and ankle brace. Inability to move toes is likely due to swelling near the nerve sheath which is fairly common with severe ankle sprains. Will continue to monitor and will refer to orthopedics if no improvement on next visit. Can also monitor for developing fracture even though no fracture seen on initial imaging at that time if patient still has marked pain with weight bearing.

## 2011-07-01 NOTE — Assessment & Plan Note (Addendum)
Patient will see neurologist 5/15 and will see Dr. Dione Booze of ophthalmology on 5/17. Asked patient to go to radiology to get a CD of her cineradiography and brain MRI to bring to neurology appt. Will need to obtain these records. Asked her to return in 10 days.

## 2011-07-08 ENCOUNTER — Encounter: Payer: Medicaid Other | Admitting: Ophthalmology

## 2011-07-09 ENCOUNTER — Encounter: Payer: Medicaid Other | Admitting: Ophthalmology

## 2011-07-11 ENCOUNTER — Encounter: Payer: Medicaid Other | Admitting: Ophthalmology

## 2011-07-15 ENCOUNTER — Encounter (HOSPITAL_COMMUNITY): Payer: Self-pay

## 2011-07-15 ENCOUNTER — Emergency Department (HOSPITAL_COMMUNITY): Payer: Medicaid Other

## 2011-07-15 ENCOUNTER — Emergency Department (HOSPITAL_COMMUNITY)
Admission: EM | Admit: 2011-07-15 | Discharge: 2011-07-15 | Disposition: A | Discharge status: Home / Self Care | Payer: Medicaid Other | Attending: Emergency Medicine | Admitting: Emergency Medicine

## 2011-07-15 DIAGNOSIS — R1011 Right upper quadrant pain: Secondary | ICD-10-CM | POA: Insufficient documentation

## 2011-07-15 DIAGNOSIS — R51 Headache: Secondary | ICD-10-CM | POA: Insufficient documentation

## 2011-07-15 DIAGNOSIS — K5900 Constipation, unspecified: Secondary | ICD-10-CM | POA: Insufficient documentation

## 2011-07-15 DIAGNOSIS — R112 Nausea with vomiting, unspecified: Secondary | ICD-10-CM | POA: Insufficient documentation

## 2011-07-15 DIAGNOSIS — R7989 Other specified abnormal findings of blood chemistry: Secondary | ICD-10-CM | POA: Insufficient documentation

## 2011-07-15 DIAGNOSIS — K2970 Gastritis, unspecified, without bleeding: Secondary | ICD-10-CM | POA: Insufficient documentation

## 2011-07-15 HISTORY — DX: Migraine, unspecified, not intractable, without status migrainosus: G43.909

## 2011-07-15 LAB — DIFFERENTIAL
Basophils Absolute: 0 10*3/uL (ref 0.0–0.1)
Basophils Relative: 0 % (ref 0–1)
Eosinophils Absolute: 0 10*3/uL (ref 0.0–0.7)
Neutrophils Relative %: 74 % (ref 43–77)

## 2011-07-15 LAB — COMPREHENSIVE METABOLIC PANEL
ALT: 122 U/L — ABNORMAL HIGH (ref 0–35)
Albumin: 3.5 g/dL (ref 3.5–5.2)
Alkaline Phosphatase: 69 U/L (ref 39–117)
Potassium: 4.4 mEq/L (ref 3.5–5.1)
Sodium: 137 mEq/L (ref 135–145)
Total Protein: 7.3 g/dL (ref 6.0–8.3)

## 2011-07-15 LAB — URINALYSIS, ROUTINE W REFLEX MICROSCOPIC
Bilirubin Urine: NEGATIVE
Hgb urine dipstick: NEGATIVE
Nitrite: NEGATIVE
Specific Gravity, Urine: 1.027 (ref 1.005–1.030)
pH: 6 (ref 5.0–8.0)

## 2011-07-15 LAB — CBC
MCH: 27.7 pg (ref 26.0–34.0)
MCHC: 33.4 g/dL (ref 30.0–36.0)
Platelets: 279 10*3/uL (ref 150–400)
RBC: 4.58 MIL/uL (ref 3.87–5.11)
RDW: 13.8 % (ref 11.5–15.5)

## 2011-07-15 LAB — URINE MICROSCOPIC-ADD ON

## 2011-07-15 LAB — POCT PREGNANCY, URINE: Preg Test, Ur: NEGATIVE

## 2011-07-15 LAB — GLUCOSE, CAPILLARY: Glucose-Capillary: 115 mg/dL — ABNORMAL HIGH (ref 70–99)

## 2011-07-15 MED ORDER — RANITIDINE HCL 150 MG PO TABS: 150.0000 mg | ORAL_TABLET | Freq: Two times a day (BID) | ORAL | Status: DC

## 2011-07-15 MED ORDER — GI COCKTAIL ~~LOC~~
30.0000 mL | Freq: Once | ORAL | Status: AC
  Administered 2011-07-15: 30 mL via ORAL
  Filled 2011-07-15: qty 30

## 2011-07-15 NOTE — Discharge Instructions (Signed)
Gastritis Gastritis is an inflammation (the body's way of reacting to injury and/or infection) of the stomach. It is often caused by viral or bacterial (germ) infections. It can also be caused by chemicals (including alcohol) and medications. This illness may be associated with generalized malaise (feeling tired, not well), cramps, and fever. The illness may last 2 to 7 days. If symptoms of gastritis continue, gastroscopy (looking into the stomach with a telescope-like instrument), biopsy (taking tissue samples), and/or blood tests may be necessary to determine the cause. Antibiotics will not affect the illness unless there is a bacterial infection present. One common bacterial cause of gastritis is an organism known as H. Pylori. This can be treated with antibiotics. Other forms of gastritis are caused by too much acid in the stomach. They can be treated with medications such as H2 blockers and antacids. Home treatment is usually all that is needed. Young children will quickly become dehydrated (loss of body fluids) if vomiting and diarrhea are both present. Medications may be given to control nausea. Medications are usually not given for diarrhea unless especially bothersome. Some medications slow the removal of the virus from the gastrointestinal tract. This slows down the healing process. HOME CARE INSTRUCTIONS Home care instructions for nausea and vomiting:  For adults: drink small amounts of fluids often. Drink at least 2 quarts a day. Take sips frequently. Do not drink large amounts of fluid at one time. This may worsen the nausea.   Only take over-the-counter or prescription medicines for pain, discomfort, or fever as directed by your caregiver.   Drink clear liquids only. Those are anything you can see through such as water, broth, or soft drinks.   Once you are keeping clear liquids down, you may start full liquids, soups, juices, and ice cream or sherbet. Slowly add bland (plain, not spicy)  foods to your diet.  Home care instructions for diarrhea:  Diarrhea can be caused by bacterial infections or a virus. Your condition should improve with time, rest, fluids, and/or anti-diarrheal medication.   Until your diarrhea is under control, you should drink clear liquids often in small amounts. Clear liquids include: water, broth, jell-o water and weak tea.  Avoid:  Milk.   Fruits.   Tobacco.   Alcohol.   Extremely hot or cold fluids.   Too much intake of anything at one time.  When your diarrhea stops you may add the following foods, which help the stool to become more formed:  Rice.   Bananas.   Apples without skin.   Dry toast.  Once these foods are tolerated you may add low-fat yogurt and low-fat cottage cheese. They will help to restore the normal bacterial balance in your bowel. Wash your hands well to avoid spreading bacteria (germ) or virus. SEEK IMMEDIATE MEDICAL CARE IF:   You are unable to keep fluids down.   Vomiting or diarrhea become persistent (constant).   Abdominal pain develops, increases, or localizes. (Right sided pain can be appendicitis. Left sided pain in adults can be diverticulitis.)   You develop a fever (an oral temperature above 102 F (38.9 C)).   Diarrhea becomes excessive or contains blood or mucus.   You have excessive weakness, dizziness, fainting or extreme thirst.   You are not improving or you are getting worse.   You have any other questions or concerns.  Document Released: 01/28/2001 Document Revised: 01/23/2011 Document Reviewed: 02/03/2005 [employer] Patient Information 2012 [employer]. 

## 2011-07-15 NOTE — ED Provider Notes (Addendum)
History     CSN: [id number]  Arrival date & time 07/15/11  1133   First MD Initiated Contact with Patient 07/15/11 1507      Chief Complaint  Patient presents with  . Chest Pain  . Headache  . Dizziness    (Consider location/radiation/quality/duration/timing/severity/associated sxs/prior treatment) Patient is a 26 y.o. female presenting with headaches and abdominal pain. The history is provided by the patient.  Headache  Associated symptoms include nausea and vomiting. Pertinent negatives include no fever and no shortness of breath.  Abdominal Pain The primary symptoms of the illness include abdominal pain, nausea and vomiting. The primary symptoms of the illness do not include fever, shortness of breath or diarrhea. The current episode started 1 to 2 hours ago. The onset of the illness was sudden. The problem has not changed since onset. The abdominal pain is located in the RUQ and epigastric region. The abdominal pain radiates to the chest. The severity of the abdominal pain is 9/10. The abdominal pain is relieved by vomiting (Improved with vomiting). Exacerbated by: Palpation.  The vomiting began today. Frequency: Twice. The emesis contains stomach contents.  Associated with: Note and said, alcohol use or recent antibiotics. The patient has not had a change in bowel habit. Additional symptoms associated with the illness include constipation. Associated symptoms comments: Last bowel movement was 3 days ago which is not unusual for her. Significant associated medical issues include gallstones. Significant associated medical issues do not include PUD, GERD or inflammatory bowel disease. Associated medical issues comments: Status post cholecystectomy 7 years ago.    Past Medical History  Diagnosis Date  . Depression     took lexapro for 6 months stopped taking about one year ago  . 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 Chronic kidney disease   . Bipolar 1 disorder   .  Chiari malformation   . Migraines     Past Surgical History  Procedure Date  . Cesarean section   . Cholecystectomy   . Myringotomy   . Tonsillectomy   . Induced abortion     vaccum on oct 2009    Family History  Problem Relation Age of Onset  . Cancer Maternal Grandmother     Breast cancer  . Cancer Paternal Grandmother     Breast cancer  . Stroke Paternal Grandmother 41  . Diabetes Cousin   . Hypertension Cousin     History  Substance Use Topics  . Smoking status: Never Smoker   . Smokeless tobacco: Not on file  . Alcohol Use: Yes     on the w/e    OB History    Grav Para Term Preterm Abortions TAB SAB Ect Mult Living   1 1 1       1       Review of Systems  Constitutional: Negative for fever.  Respiratory: Negative for shortness of breath.   Cardiovascular: Negative for chest pain and leg swelling.  Gastrointestinal: Positive for nausea, vomiting, abdominal pain and constipation. Negative for diarrhea.  Neurological: Positive for headaches.       Patient states she has a history of chronic migraines with the Chiari malformation. Today she is having one of her typical migraines. She states is no different than the one she's had in the past  All other systems reviewed and are negative.    Allergies  Hydrocodone-acetaminophen  Home Medications   Current Outpatient Rx  Name Route Sig Dispense Refill  .  ACETAZOLAMIDE ER 500 MG PO CP12 Oral Take 500 mg by mouth 2 (two) times daily.    . ARIPIPRAZOLE 5 MG PO TABS Oral Take 10 mg by mouth daily. Prescribed by [facility] (mental health)    . VITAMIN C PO Oral Take 1 each by mouth daily. Vitamin c gummy    . ESCITALOPRAM OXALATE 20 MG PO TABS Oral Take 1 tablet (20 mg total) by mouth daily. 30 tablet 2  . ACETAZOLAMIDE 125 MG PO TABS Oral Take 4 tablets (500 mg total) by mouth 2 (two) times daily. 60 tablet 1    BP 90/74  Pulse 79  Temp 98.2 F (36.8 C)  Resp 11  Ht 5\' 6"  (1.676 m)  Wt 262  lb (118.842 kg)  BMI 42.29 kg/m2  SpO2 99%  LMP 05/19/2011  Physical Exam  Nursing note and vitals reviewed. Constitutional: She is oriented to person, place, and time. She appears well-developed and well-nourished. No distress.  HENT:  Head: Normocephalic and atraumatic.  Mouth/Throat: Oropharynx is clear and moist.  Eyes: Conjunctivae and EOM are normal. Pupils are equal, round, and reactive to light.  Neck: Normal range of motion. Neck supple.  Cardiovascular: Normal rate, regular rhythm and intact distal pulses.   No murmur heard. Pulmonary/Chest: Effort normal and breath sounds normal. No respiratory distress. She has no wheezes. She has no rales.  Abdominal: Soft. Normal appearance. She exhibits no distension. There is tenderness in the right upper quadrant and epigastric area. There is guarding. There is no rebound and no CVA tenderness. No hernia.  Musculoskeletal: Normal range of motion. She exhibits no edema and no tenderness.  Neurological: She is alert and oriented to person, place, and time.  Skin: Skin is warm and dry. No rash noted. No erythema.  Psychiatric: She has a normal mood and affect. Her behavior is normal.    ED Course  Procedures (including critical care time)  Labs Reviewed  GLUCOSE, CAPILLARY - Abnormal; Notable for the following:    Glucose-Capillary 115 (*)    All other components within normal limits  COMPREHENSIVE METABOLIC PANEL - Abnormal; Notable for the following:    Glucose, Bld 102 (*)    AST 224 (*)    ALT 122 (*)    All other components within normal limits  URINALYSIS, ROUTINE W REFLEX MICROSCOPIC - Abnormal; Notable for the following:    APPearance HAZY (*)    Leukocytes, UA TRACE (*)    All other components within normal limits  URINE MICROSCOPIC-ADD ON - Abnormal; Notable for the following:    Squamous Epithelial / LPF FEW (*)    Bacteria, UA FEW (*)    All other components within normal limits  CBC  DIFFERENTIAL  LIPASE, BLOOD    POCT PREGNANCY, URINE   US Abdomen Complete  07/15/2011  *RADIOLOGY REPORT*  Clinical Data:  26 year old female with right upper quadrant pain nausea and vomiting.  Previous cholecystectomy.  COMPLETE ABDOMINAL ULTRASOUND  Comparison:  CT abdomen 10/17/2008.  Findings:  Gallbladder:  Surgically absent.  Common bile duct:  Normal measuring 3 mm in diameter.  Liver:  No focal lesion identified.  Within normal limits in parenchymal echogenicity.  IVC:  Appears normal.  Pancreas:  No focal abnormality seen.  Spleen:  Normal measuring 0.5 cm in length.  Right Kidney:  Normal measuring 10.5 cm in length.  Left Kidney:  Normal measuring 10.2 cm in length.  Abdominal aorta:  No aneurysm identified.  IMPRESSION: Negative abdominal ultrasound  status post cholecystectomy.  Original Report Authenticated By: Harley Hallmark, M.D.     Date: 07/15/2011  Rate: 71  Rhythm: normal sinus rhythm  QRS Axis: normal  Intervals: normal  ST/T Wave abnormalities: normal  Conduction Disutrbances:none  Narrative Interpretation:   Old EKG Reviewed: none available    1. Gastritis   2. Elevated LFTs       MDM   Patient with abdominal pain that started today causing her to vomit twice with persistent pain. She is status post cholecystectomy 7 years ago and states she does not usually get abdominal pain. She denies any change in her diet, fever, shortness of breath or true chest pain. She points to the pain in the epigastric and right upper quadrant region but no symptoms suggestive of cardiac etiology. Patient has a normal EKG and feel that her heart has been adequately evaluated based on her symptoms. Patient's LMP was this month without any missed menses or change in menses. She denies any change in medications but on exam still has significant right upper and epigastric pain.  Denies heavy NSAID use or history of GERD. Concern for possible peptic ulcer disease versus gastritis versus esophageal spasm versus bile duct  obstruction causing symptoms.  Patient has not alcohol abuser and has no left upper quadrant pain cystocele pancreatitis is less likely than likely to be pregnancy. We'll try a GI cocktail patient was improved after Zofran by EMS. CBC, CMP, lipase, UA, UPT pending. 4:25 PM CMP with elevated liver function tests with an AST of 224 and ALT of 122 with a total bili within normal limits. Lipase and CBC are within normal limits. Ultrasound of the right upper quadrant ordered for further evaluation   5:08 PM Patient's pain is much improved after GI cocktail.   8:10 PM Ultrasound is within normal limits. Patient will followup with her regular doctor in one to 2 weeks for recheck of her liver function tests. She was placed on Zantac and given return precautions.  Gwyneth Sprout, MD 07/15/11 2010  Gwyneth Sprout, MD 07/15/11 2011

## 2011-07-15 NOTE — ED Notes (Signed)
Family at bedside. 

## 2011-07-15 NOTE — ED Notes (Addendum)
Received report from [facility]. Pt came to the ED because she was having RU Quadrant/RLower Chest pain. Stated that the pain  Feels the same as when she had a gallbladder attack. Gallbladder has been removed. No respiratory or cardiac distress. Will continue to monitor.

## 2011-07-15 NOTE — ED Notes (Signed)
Per EMS, patient brought from [facility] for epigastric cp, dizziness, and HA since 0930 this morning. Pt thinks maybe she didn't eat enough when she took her medications this morning. Rates pain 8/10 now, 20g to [facility] with NS 500 ml bag and 4 mg zofran IV. Pt vomited x 2.  Pt sts the pain feels like a sharp squeezing pain like it did when she was having gallbladder attacks. EMS also gave 324 mg ASA.

## 2011-07-15 NOTE — ED Notes (Addendum)
Report received from [place], [place]. Patient care assumed at this time. Patient waiting for attending MD evaluation.

## 2011-07-15 NOTE — ED Notes (Signed)
Patient rang bell to notify RN that her chest pain is gone and she is ready to go home. Patient still waiting for attending MD eval. Will continue to monitor.

## 2011-07-25 ENCOUNTER — Ambulatory Visit (INDEPENDENT_AMBULATORY_CARE_PROVIDER_SITE_OTHER): Payer: Medicaid Other | Admitting: Ophthalmology

## 2011-07-25 ENCOUNTER — Encounter: Payer: Self-pay | Admitting: Ophthalmology

## 2011-07-25 VITALS — BP 126/85 | HR 70 | Temp 97.6°F | Ht 66.0 in | Wt 269.7 lb

## 2011-07-25 DIAGNOSIS — Z309 Encounter for contraceptive management, unspecified: Secondary | ICD-10-CM

## 2011-07-25 DIAGNOSIS — R51 Headache: Secondary | ICD-10-CM

## 2011-07-25 DIAGNOSIS — IMO0001 Reserved for inherently not codable concepts without codable children: Secondary | ICD-10-CM

## 2011-07-25 LAB — CK: Total CK: 41 U/L (ref 7–177)

## 2011-07-25 LAB — COMPLETE METABOLIC PANEL WITH GFR
ALT: 17 U/L (ref 0–35)
BUN: 6 mg/dL (ref 6–23)
CO2: 27 mEq/L (ref 19–32)
Calcium: 9.3 mg/dL (ref 8.4–10.5)
Creat: 0.68 mg/dL (ref 0.50–1.10)
GFR, Est African American: >89 mL/min
Total Bilirubin: 0.2 mg/dL — ABNORMAL LOW (ref 0.3–1.2)

## 2011-07-25 NOTE — Patient Instructions (Addendum)
-  STOP taking diamox -please call us if you have worsened headache, visual changes, or ringing in your ears. -Please ask if you can get mirena IUD (intra-uterine device) instead of depo shot

## 2011-07-25 NOTE — Progress Notes (Signed)
Subjective:   Patient ID: Martha Mathews female   DOB: 07/13/1985 26 y.o.   MRN: [id number]  HPI: Ms.Martha Mathews is a 26 y.o. woman who is here for follow up from ER visit for ? panic attack and chronic headaches.  Headache- neurologist did not feel that Chiari 1 malformation had changed or is responsible for symptoms. She feels that it is a migraine. Will request records from Dr. Threasa Beards at [facility]. Still having pulsatile tinnitus and diplopia. She feel like her appetite is less on the topamax, has been taking since 5/15. Is having headaches every morning, occasionally in the afternoon. Is supposed to take meds before headache comes on, but she wakes up with headache usually. She is going to follow up July 13th. She is also taking a medication that dissolves on her tongue which is a Triptan. If she takes diamox with food she is okay. Is taking maxalt twice a week for severe headaches. Keeping headache diary.  Nausea/ epigastric pain/ shortness of breath/ panic attack- elevated liver enzymes AST 224, ALT 122. No alcohol use since last 3 years. Hadn't had anything to eat, had taken medicines. Was worried since her son had been expelled from school. Has had similar episodes in the past. She was unresponsive and has period of memory loss. No loss of control of bowels or bladder. No tongue biting.  Depot shot for birth control- progesterone only but associated with weight gain  Past Medical History  Diagnosis Date  . Depression     took lexapro for 6 months stopped taking about one year ago  . 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 Chronic kidney disease   . Bipolar 1 disorder   . Chiari malformation   . Migraines    Current Outpatient Prescriptions  Medication Sig Dispense Refill  . acetaZOLAMIDE (DIAMOX) 125 MG tablet Take 4 tablets (500 mg total) by mouth 2 (two) times daily.  60 tablet  1  . acetaZOLAMIDE (DIAMOX) 500 MG capsule Take 500 mg by mouth 2 (two) times  daily.      . ARIPiprazole (ABILIFY) 5 MG tablet Take 10 mg by mouth daily. Prescribed by [facility])      . Ascorbic Acid (VITAMIN C PO) Take 1 each by mouth daily. Vitamin c gummy      . escitalopram (LEXAPRO) 20 MG tablet Take 1 tablet (20 mg total) by mouth daily.  30 tablet  2  . ranitidine (ZANTAC) 150 MG tablet Take 1 tablet (150 mg total) by mouth 2 (two) times daily.  60 tablet  0   Family History  Problem Relation Age of Onset  . Cancer Maternal Grandmother     Breast cancer  . Cancer Paternal Grandmother     Breast cancer  . Stroke Paternal Grandmother 49  . Diabetes Cousin   . Hypertension Cousin    History   Social History  . Marital Status: Single    Spouse Name: N/A    Number of Children: N/A  . Years of Education: N/A   Social History Main Topics  . Smoking status: Never Smoker   . Smokeless tobacco: None  . Alcohol Use: Yes     on the w/e  . Drug Use: No  . Sexually Active: Yes   Other Topics Concern  . None   Social History Narrative   Regular exercise- yes    Objective:  Physical Exam: Filed Vitals:  07/25/11 1505  BP: 126/85  Pulse: 70  Temp: 97.6 F (36.4 C)  TempSrc: Oral  Height: 5\' 6"  (1.676 m)  Weight: 269 lb 11.2 oz (122.335 kg)  SpO2: 97%   General: obese tired appearing young woman sitting in chair HEENT: PERRL, EOMI, no scleral icterus Cardiac: RRR, no rubs, murmurs or gallops Pulm: clear to auscultation bilaterally, moving normal volumes of air Abd: soft, nontender, nondistended, BS present Ext: warm and well perfused, no pedal edema Neuro: alert and oriented X3, cranial nerves II-XII grossly intact  Assessment & Plan:

## 2011-08-01 DIAGNOSIS — Z3009 Encounter for other general counseling and advice on contraception: Secondary | ICD-10-CM | POA: Insufficient documentation

## 2011-08-06 NOTE — Assessment & Plan Note (Addendum)
Asked patient to stop taking diamox since neurologist Dr. Terrace Arabia did not seem to consider pseudotumor as a possible cause and is treating her for migraine. Removed from med list. Patient will follow up with Dr. Terrace Arabia in July.Topamax will be a good preventative agent in her since it may promote weight loss and may help stabilize her mood.

## 2011-08-06 NOTE — Assessment & Plan Note (Signed)
Advised patient that depot shot is not best choice given weight gain an possible mood effects. We discussed an IUD as an option since she is not considering getting pregnant at this time.

## 2011-08-11 ENCOUNTER — Encounter: Payer: Self-pay | Admitting: Internal Medicine

## 2011-08-11 ENCOUNTER — Ambulatory Visit (INDEPENDENT_AMBULATORY_CARE_PROVIDER_SITE_OTHER): Payer: Medicaid Other | Admitting: Internal Medicine

## 2011-08-11 VITALS — BP 122/82 | HR 79 | Temp 97.4°F | Ht 66.0 in | Wt 268.4 lb

## 2011-08-11 DIAGNOSIS — X58XXXA Exposure to other specified factors, initial encounter: Secondary | ICD-10-CM

## 2011-08-11 DIAGNOSIS — S93409A Sprain of unspecified ligament of unspecified ankle, initial encounter: Secondary | ICD-10-CM

## 2011-08-11 MED ORDER — DICLOFENAC SODIUM 1 % TD GEL: 1.0000 "application " | Freq: Four times a day (QID) | TRANSDERMAL | Status: AC

## 2011-08-11 NOTE — Progress Notes (Signed)
  Subjective:    Patient ID: Martha Mathews, female    DOB: 05-Apr-1985, 26 y.o.   MRN: [id number]  HPI Patient is a 26 year old female with past medical history most significant for major depression.  She was seen 2 weeks ago by Dr. Maisie Fus for her elevated liver enzymes. The repeat test done on June 7 did not show any elevated enzymes anymore.  Patient also has left ankle pain. She sees that she was involved in an accident 2 months ago where she fell and was told by the ER at that she sprained her ankle. She had x-rays done at that time which is reviewed by me today. The left ankle is still swollen and painful. She has been walking on her left foot to to avoid hurting. Pain is 6/10 at its worse. She has tried over-the-counter medications which does not help.  No other complaints.   Review of Systems  Constitutional: Negative for fever, activity change and appetite change.  HENT: Negative for sore throat.   Respiratory: Negative for cough and shortness of breath.   Cardiovascular: Negative for chest pain and leg swelling.  Gastrointestinal: Negative for nausea, abdominal pain, diarrhea, constipation and abdominal distention.  Genitourinary: Negative for frequency, hematuria and difficulty urinating.  Musculoskeletal: Positive for joint swelling, arthralgias and gait problem.  Neurological: Negative for dizziness and headaches.  Psychiatric/Behavioral: Negative for suicidal ideas and behavioral problems.       Objective:   Physical Exam  Constitutional: She is oriented to person, place, and time. She appears well-developed and well-nourished.  HENT:  Head: Normocephalic and atraumatic.  Eyes: Conjunctivae and EOM are normal. Pupils are equal, round, and reactive to light. No scleral icterus.  Neck: Normal range of motion. Neck supple. No JVD present. No thyromegaly present.  Cardiovascular: Normal rate, regular rhythm, normal heart sounds and intact distal pulses.  Exam reveals no  gallop and no friction rub.   No murmur heard. Pulmonary/Chest: Effort normal and breath sounds normal. No respiratory distress. She has no wheezes. She has no rales.  Abdominal: Soft. Bowel sounds are normal. She exhibits no distension and no mass. There is no tenderness. There is no rebound and no guarding.  Musculoskeletal: She exhibits edema and tenderness.       Left ankle is diffusely swollen, tender with restricted range of motion.   Lymphadenopathy:    She has no cervical adenopathy.  Neurological: She is alert and oriented to person, place, and time.  Psychiatric: She has a normal mood and affect. Her behavior is normal.          Assessment & Plan:

## 2011-08-11 NOTE — Assessment & Plan Note (Signed)
Patient's transaminitis has completely resolved. I do not think it is in need to do any further testing at this point of time. The transient elevation may be an error from lab.

## 2011-08-11 NOTE — Assessment & Plan Note (Signed)
Patient continues to have tenderness and swelling of ankle 2 months after the initial fall. Swelling which is persistent after 2 months is fairly uncommon with ankle sprains and I believe that at this point of time I referred to a sports medicine specialist is in order to further evaluate the pain and swelling. On reviewing the x-rays it was noted patient had remnants of previous fracture at the same site. Patient will also be prescribed Voltaren gel and was advised to do warm compresses 2-3 times a day along with over-the-counter pain medications.

## 2011-08-11 NOTE — Patient Instructions (Signed)
Ankle Sprain An ankle sprain is an injury to the strong, fibrous tissues (ligaments) that hold the bones of your ankle joint together.  CAUSES Ankle sprain usually is caused by a fall or by twisting your ankle. People who participate in sports are more prone to these types of injuries.  SYMPTOMS  Symptoms of ankle sprain include:  Pain in your ankle. The pain may be present at rest or only when you are trying to stand or walk.   Swelling.   Bruising. Bruising may develop immediately or within 1 to 2 days after your injury.   Difficulty standing or walking.  DIAGNOSIS  Your caregiver will ask you details about your injury and perform a physical exam of your ankle to determine if you have an ankle sprain. During the physical exam, your caregiver will press and squeeze specific areas of your foot and ankle. Your caregiver will try to move your ankle in certain ways. An X-ray exam may be done to be sure a bone was not broken or a ligament did not separate from one of the bones in your ankle (avulsion).  TREATMENT  Certain types of braces can help stabilize your ankle. Your caregiver can make a recommendation for this. Your caregiver may recommend the use of medication for pain. If your sprain is severe, your caregiver may refer you to a surgeon who helps to restore function to parts of your skeletal system (orthopedist) or a physical therapist. HOME CARE INSTRUCTIONS  Apply ice to your injury for 1 to 2 days or as directed by your caregiver. Applying ice helps to reduce inflammation and pain.  Put ice in a plastic bag.   Place a towel between your skin and the bag.   Leave the ice on for 15 to 20 minutes at a time, every 2 hours while you are awake.   Take over-the-counter or prescription medicines for pain, discomfort, or fever only as directed by your caregiver.   Keep your injured leg elevated, when possible, to lessen swelling.   If your caregiver recommends crutches, use them as  instructed. Gradually, put weight on the affected ankle. Continue to use crutches or a cane until you can walk without feeling pain in your ankle.   If you have a plaster splint, wear the splint as directed by your caregiver. Do not rest it on anything harder than a pillow the first 24 hours. Do not put weight on it. Do not get it wet. You may take it off to take a shower or bath.   You may have been given an elastic bandage to wear around your ankle to provide support. If the elastic bandage is too tight (you have numbness or tingling in your foot or your foot becomes cold and blue), adjust the bandage to make it comfortable.   If you have an air splint, you may blow more air into it or let air out to make it more comfortable. You may take your splint off at night and before taking a shower or bath.   Wiggle your toes in the splint several times per day if you are able.  SEEK MEDICAL CARE IF:   You have an increase in bruising, swelling, or pain.   Your toes feel cold.   Pain relief is not achieved with medication.  SEEK IMMEDIATE MEDICAL CARE IF: Your toes are numb or blue or you have severe pain. MAKE SURE YOU:   Understand these instructions.   Will watch your condition.     Will get help right away if you are not doing well or get worse.  Document Released: 02/03/2005 Document Revised: 01/23/2011 Document Reviewed: 09/08/2007 Exit[employer] Patient Information 2012 [employer]. 

## 2011-09-03 ENCOUNTER — Other Ambulatory Visit (HOSPITAL_COMMUNITY)
Admission: RE | Admit: 2011-09-03 | Discharge: 2011-09-03 | Disposition: A | Discharge status: Home / Self Care | Payer: Medicaid Other | Source: Ambulatory Visit | Attending: Internal Medicine | Admitting: Internal Medicine

## 2011-09-03 ENCOUNTER — Ambulatory Visit (INDEPENDENT_AMBULATORY_CARE_PROVIDER_SITE_OTHER): Payer: Medicaid Other | Admitting: Internal Medicine

## 2011-09-03 ENCOUNTER — Encounter: Payer: Self-pay | Admitting: Internal Medicine

## 2011-09-03 VITALS — BP 127/87 | HR 74 | Temp 98.2°F | Ht 65.0 in | Wt 272.4 lb

## 2011-09-03 DIAGNOSIS — Z113 Encounter for screening for infections with a predominantly sexual mode of transmission: Secondary | ICD-10-CM | POA: Insufficient documentation

## 2011-09-03 DIAGNOSIS — R42 Dizziness and giddiness: Secondary | ICD-10-CM

## 2011-09-03 DIAGNOSIS — G935 Compression of brain: Secondary | ICD-10-CM

## 2011-09-03 DIAGNOSIS — M25579 Pain in unspecified ankle and joints of unspecified foot: Secondary | ICD-10-CM

## 2011-09-03 DIAGNOSIS — Z3490 Encounter for supervision of normal pregnancy, unspecified, unspecified trimester: Secondary | ICD-10-CM

## 2011-09-03 DIAGNOSIS — G4700 Insomnia, unspecified: Secondary | ICD-10-CM

## 2011-09-03 DIAGNOSIS — S93409A Sprain of unspecified ligament of unspecified ankle, initial encounter: Secondary | ICD-10-CM

## 2011-09-03 DIAGNOSIS — F330 Major depressive disorder, recurrent, mild: Secondary | ICD-10-CM

## 2011-09-03 DIAGNOSIS — IMO0001 Reserved for inherently not codable concepts without codable children: Secondary | ICD-10-CM

## 2011-09-03 DIAGNOSIS — X58XXXA Exposure to other specified factors, initial encounter: Secondary | ICD-10-CM

## 2011-09-03 DIAGNOSIS — N912 Amenorrhea, unspecified: Secondary | ICD-10-CM

## 2011-09-03 DIAGNOSIS — Z331 Pregnant state, incidental: Secondary | ICD-10-CM

## 2011-09-03 DIAGNOSIS — Z309 Encounter for contraceptive management, unspecified: Secondary | ICD-10-CM

## 2011-09-03 DIAGNOSIS — R51 Headache: Secondary | ICD-10-CM

## 2011-09-03 LAB — POCT URINE PREGNANCY: Preg Test, Ur: POSITIVE

## 2011-09-04 DIAGNOSIS — N912 Amenorrhea, unspecified: Secondary | ICD-10-CM | POA: Insufficient documentation

## 2011-09-04 DIAGNOSIS — Z3490 Encounter for supervision of normal pregnancy, unspecified, unspecified trimester: Secondary | ICD-10-CM | POA: Insufficient documentation

## 2011-09-04 DIAGNOSIS — Z113 Encounter for screening for infections with a predominantly sexual mode of transmission: Secondary | ICD-10-CM | POA: Insufficient documentation

## 2011-09-04 NOTE — Assessment & Plan Note (Signed)
Stable  No complaints today

## 2011-09-04 NOTE — Patient Instructions (Addendum)
--  Follow up with your OB/GYN doctor for initiation of prenatal care as soon as possible.  --Follow up with your psychiatrist as soon as possible for management of your medications for depression.   --You most likely have a viral infection that is causing the sore throat. You may follow up with [place] or with your OB/GYN doctor if your symptoms worsen or if you develop fever, chills, or vomiting.  --Continue drinking fluids, water and juice are fine.  --Stop taking Topamax and using Voltaren gel, these are contraindicated during pregnancy.   --As we discussed, there are studies going on to help monitor pregnancy complications if women have taken certain medications while pregnant. If you decide to go on these studies, you DO NOT have to keep taking these medications to be in the study. You may want to share this information with your OB/GYN doctor.     Abilify/Aripiprazole: Healthcare providers are encouraged to enroll women 81-38 years of age exposed to aripiprazole during pregnancy in the Atypical Antipsychotics Pregnancy Registry [phone] or FlavorBlog.is)  Topamax/topiramate: Patients exposed to topiramate during pregnancy are encouraged to enroll themselves into the AED Pregnancy Registry by calling [phone]. Additional information is available at [email or web address].com.pt.   MAXALT/rizatriptan. Pregnancy registry has been established to monitor outcomes of women exposed to rizatriptan during pregnancy [phone])

## 2011-09-04 NOTE — Assessment & Plan Note (Signed)
Patient was prescribed Ambien 5 mg each bedtime for sleep aid. She doesn't have the medication with her today but tells me this was prescribed by her psychiatrist. She has been taking it for at least 2 weeks now and has had good results.   Patient advised to followup with her psychiatrist given her positive pregnancy test.

## 2011-09-04 NOTE — Assessment & Plan Note (Signed)
Patient had with one female partner of 3 years. STI exposure risk.  Ordered urine GC/Chlamydia

## 2011-09-04 NOTE — Progress Notes (Signed)
Subjective:    Patient ID: Martha Mathews, female    DOB: March 09, 1985, 26 y.o.   MRN: [id number]  HPI Ms Martha Mathews is a 26 year old woman with past medical history significant for major depressive disorder, headaches, recent ankle sprain, who comes in today for followup of left ankle pain and evaluation of sore throat, nausea, and diarrhea x5 days. The sore throat is constant and worse with swallowing. Her nausea is constant and not associated with eating. She has had 2-3 episodes of diarrhea described as loose brown stools. She denies rhinorrhea, cough, postnasal drainage, fever, chills, decreased appetite, vomiting, or blood in her stools. Of note, her last menstrual period was on 06/21/2011, she has been sexually active during the time, and does not use a birth control method. In the past she has used Depo-Provera shots as her birth control method but for the last 4 months has not had any birth control method as she wanted some time to decide about getting an IUD placed. For the last 2 weeks she has had to increase nipple sensitivity and has noted at least a 5 pound weight gain. She has had one female partner for the last 3 years who ended a relationship one week ago. She lives with her father and she has a 26 year old son.  Her left ankle pain is persistent and has been present for approximately 3 months since her fall. She has point tenderness on the lateral last malleolus which is worsened with weightbearing. She had x-rays done immediately after her fall 3 months ago which did not reveal new fractures. She has been wearing an ankle brace but provides minor pain relief. She has also used all-terrain 1% gel with moderate pain relief.  She has no other complaints today.    Review of Systems  Constitutional: Negative for fever, chills, diaphoresis, activity change, appetite change, fatigue and unexpected weight change.  HENT: Negative for dental problem and sinus pressure.        Refer to history of  present illness for more information.  Eyes: Negative for photophobia, pain, discharge, redness and itching.  Respiratory: Negative for chest tightness and shortness of breath.   Cardiovascular: Negative for chest pain and leg swelling.  Gastrointestinal: Positive for abdominal pain. Negative for abdominal distention.       Bilateral lower quadrant pain. Refer to history of present illness for more information.  Genitourinary: Positive for vaginal discharge. Negative for dysuria, frequency, flank pain, difficulty urinating and pelvic pain.       Scant, white watery discharge that is non-odorous  Musculoskeletal: Negative for arthralgias.       Refer to history of present illness for more information.  Neurological: Negative for dizziness and headaches.  Psychiatric/Behavioral: Negative for suicidal ideas, hallucinations, behavioral problems, self-injury, dysphoric mood and agitation. The patient is not nervous/anxious.        Objective:   Physical Exam  Constitutional: She is oriented to person, place, and time. She appears well-developed and well-nourished. No distress.  HENT:  Head: Normocephalic and atraumatic.  Right Ear: External ear normal.  Left Ear: External ear normal.  Nose: Nose normal.  Mouth/Throat: Oropharynx is clear and moist. No oropharyngeal exudate.       Mild  uvular erythema.  Eyes: Conjunctivae are normal. Right eye exhibits no discharge. No scleral icterus.  Neck: Neck supple.  Cardiovascular: Normal rate and normal heart sounds.   Pulmonary/Chest: Effort normal and breath sounds normal. No respiratory distress. She has no wheezes. She  has no rales. She exhibits no tenderness.  Abdominal: Soft. Bowel sounds are normal. She exhibits no distension and no mass. There is tenderness. There is no rebound and no guarding.       Bilateral lower quadrant tenderness to deep palpation  Musculoskeletal: Normal range of motion. She exhibits tenderness. She exhibits no edema.         Left lateral malleolus tenderness upon palpation. Patient wearing a left ankle brace.   Lymphadenopathy:    She has no cervical adenopathy.  Neurological: She is alert and oriented to person, place, and time. She has normal reflexes.  Skin: Skin is warm and dry. She is not diaphoretic. No erythema.  Psychiatric: She has a normal mood and affect. Her behavior is normal. Thought content normal.          Assessment & Plan:

## 2011-09-04 NOTE — Assessment & Plan Note (Addendum)
Patient was initially on Depo-Provera but decided to to stop treatment based on weight gain and concerns were bone loss. Patient was currently considering IUD placement.   Urine pregnancy test positive today

## 2011-09-04 NOTE — Assessment & Plan Note (Signed)
Patient was seen by Dr. Terrace Arabia at [facility] neurology 07/25/2011. Patient was started on Topamax and given Maxalt when necessary for headache. Patient advised to followup in one month but has not made an appointment.  Given patient's positive pregnancy test advised patient to discontinue taking Topamax which is pregnancy class D medication.  Patient to followup with her OB/GYN doctor for headache management, may need followup visit with Dr. Terrace Arabia

## 2011-09-04 NOTE — Assessment & Plan Note (Signed)
--  Patient was counseled on to stop taking Topamax, which is a pregnancy class D medication.  --Patient was advised to see her OB/GYN doctor as soon as possible to initiate prenatal care. --Patient was advised to start taking folic acid thousand milligrams per day or a prenatal vitamin containing folic acid as soon as possible --Patient was advised to see her psychiatrist and therapist as soon as possible for management of her major depressive disorder medications. --Patient was given information on pregnancy registry for women exposed to Topamax, Maxalt, and Abilify while pregnant.

## 2011-09-04 NOTE — Assessment & Plan Note (Addendum)
Patient followed by Dr. Terrace Arabia at [facility] neurology. No complaints of worse headache, diplopia, blurry vision today.

## 2011-09-04 NOTE — Assessment & Plan Note (Signed)
[facility]. She is seen by Dr. Dorene Grebe and Dr. Steve Rattler. She is currently on Abilify 5 mg and Lexapro 20 mg. She recently started Ambien 5 mg each bedtime for difficulty falling asleep.

## 2011-09-04 NOTE — Assessment & Plan Note (Signed)
Patient with left ankle pain for over 3 months now. Patient has been wearing an ankle brace for pain management with minimum results.  Gave patient orthopedic referral.

## 2011-09-05 NOTE — Progress Notes (Signed)
agree

## 2011-09-30 ENCOUNTER — Encounter: Payer: Self-pay | Admitting: Internal Medicine

## 2011-10-04 ENCOUNTER — Other Ambulatory Visit: Payer: Self-pay | Admitting: Ophthalmology

## 2011-10-09 ENCOUNTER — Other Ambulatory Visit: Payer: Self-pay

## 2011-10-09 LAB — OB RESULTS CONSOLE ANTIBODY SCREEN: Antibody Screen: NEGATIVE

## 2011-10-09 LAB — OB RESULTS CONSOLE RUBELLA ANTIBODY, IGM: Rubella: IMMUNE

## 2011-10-09 LAB — OB RESULTS CONSOLE ABO/RH

## 2011-10-09 LAB — OB RESULTS CONSOLE HEPATITIS B SURFACE ANTIGEN: Hepatitis B Surface Ag: NEGATIVE

## 2011-10-15 NOTE — Addendum Note (Signed)
Addended by: Neomia Dear on: 10/15/2011 06:50 PM   Modules accepted: Orders

## 2011-10-17 ENCOUNTER — Ambulatory Visit (HOSPITAL_COMMUNITY): Payer: Medicaid Other

## 2011-10-17 ENCOUNTER — Encounter: Payer: Medicaid Other | Admitting: Internal Medicine

## 2011-10-17 ENCOUNTER — Ambulatory Visit (HOSPITAL_COMMUNITY)
Admission: RE | Admit: 2011-10-17 | Discharge: 2011-10-17 | Disposition: A | Discharge status: Home / Self Care | Payer: Medicaid Other | Source: Ambulatory Visit | Attending: Obstetrics and Gynecology | Admitting: Obstetrics and Gynecology

## 2011-10-21 NOTE — Progress Notes (Signed)
Genetic Counseling  High-Risk Gestation Note  Appointment Date:  10/17/2011 Referred By: Oliver Pila, MD Date of Birth:  1985/03/11    Pregnancy History: [id number] Estimated Date of Delivery: 04/28/2012 Attending: Alpha Gula, MD   I met with Ms. 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    We reviewed the results of Ms. DONNETTE MACMULLEN CF carrier screening.  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 She reported that CF carrier screening was performed for her partner today through her OB office. Results are currently pending.  [name] reported no additional relatives known to be CF carriers and no known relatives with cystic fibrosis.  The father of the pregnancy also reportedly has no known individuals with cystic fibrosis in his family history, and consanguinity to Ms. TYSHAY ADEE was denied. He reportedly has [place] and Native American ancestry.   Ms. ROSELINA BURGUENO was provided with written information regarding cystic fibrosis. Classic features of CF include thickened secretions in the lungs, digestive and reproductive systems.  This life-limiting condition is characterized by chronic respiratory infections requiring daily chest therapies, pancreatic dysfunction disrupting the body's ability to break down food and extract nutrients as it should, which may restrict growth.  Infertility commonly occurs in males.  With therapies, such as daily respiratory therapies and medications to aid digestion, the median lifespan for people with CF is now mid-30's. Treatment may involve lung transplantation in some cases. There can be significant variability in the severity of symptoms and expression of the disease; severity cannot be predicted prenatally.  The delta F508 mutation is the most common  mutation in Caucasians and is associated with classic (severe) forms of the disease. However, expression of the disease may be modified depending upon the second mutation present in an individual with CF.   We spent time reviewing the autosomal recessive inheritance of cystic fibrosis. Cystic fibrosis (CF) is a common genetic condition in the Caucasian population occurring in approximately 1 in 3,300 Caucasian births.  This means approximately 1 in 29 Caucasians is a CF carrier.  We discussed that individuals who are carriers have one copy of the CFTR gene with a disease causing mutation, and their other CFTR gene copy functions correctly. Thus, carriers typically do not have associated medical symptoms. We discussed that when both parents are carriers for CF, each pregnancy has an independent chance for one of the following outcomes: a 25% (1 in 4) chance to inherit both mutations and thus have CF; a 50% (1 in 2) chance to inherit one gene mutation and be a carrier similar to parents; and a 25% (1 in 4) chance to be neither a carrier nor have CF. When one parent is a CF carrier but the other is not, then each pregnancy has a 1 in 2 (50%) chance to be a CF carrier but would not be expected to be at increased risk to inherit CF.   There are thought to be thousands of mutations which can cause the CFTR gene to not function properly. Carrier screening is available to assess for the most common disease causing mutations. However, carrier screening does not identify all CF carriers. Thus, a negative CF carrier screen would reduce but not eliminate the chance to be a CF carrier and thus the chance for CF in a pregnancy.  Carrier screening for the most common mutations detects approximately 90% of carriers in the Caucasian population.  Given the reported family history for the father of the pregnancy, he would have the general population risk to be a carrier of approximately 1 in 29, prior to carrier screening. Thus,  given that Ms. HOLY BATTENFIELD is a known CF carrier, the risk for CF to be in the current pregnancy, prior to carrier screening for Mr. Felizardo Hoffmann, is approximately 1 in 116 (0.9%).  We discussed that the results of CF carrier screening for the father of the pregnancy, which are currently pending through the patient's OB office, would further refine the risk for CF in the current pregnancy. The patient was encouraged to contact us should she have questions regarding the result of his CF carrier screening.   We reviewed that when both parents are identified to be CF carriers, prenatal diagnosis via amniocentesis (or chorionic villus sampling in the first trimester) would be available, if desired. The risks, benefits, and limitations of amniocentesis were reviewed. A fetus with cystic fibrosis typically appears normal on targeted ultrasound, although rarely echogenic bowel is visualized on ultrasound. However, the presence of echogenic bowel on targeted ultrasound is not diagnostic for CF in a pregnancy, nor does the absence of echogenic bowel on ultrasound rule out CF in the pregnancy. We discussed that postnatal testing for CF can also be performed for babies identified to be at risk to inherit CF. She understands that in [place], the newborn screening test will detect CF, but that carriers may come back as false positives.  Ms. FALISA LAMORA stated that she would not likely be interested in prenatal diagnosis for CF via amniocentesis, even in the event that Mr. Ore was also identified to be a CF carrier given the associated risk of complications.   Both family histories were additionally reviewed were contributory for muscular dystrophy in the father of the pregnancy. The patient did not have information regarding the specific type of muscular dystrophy. Reportedly, he was diagnosed at age 21 years. He is currently 26 years old, does not require assistance with walking but reportedly is unable to stand for  long periods of time given that his muscles hurt. He reportedly walks with a limp, and is described to be primarily affected in his legs. No additional relatives were reported with muscular dystrophy. He has a 32 year-old son with a previous partner who is reportedly healthy. We discussed that there are many types of muscular dystrophies and muscle weakness conditions which can be acquired, sporadic, or inherited in a number of ways. Without additional information regarding the specific type of muscular dystrophy, we cannot accurately assess recurrence risk to the current pregnancy. We reviewed various forms of inheritance including X-linked, autosomal dominant, and autosomal recessive. We discussed that in the case of an X-linked condition, affected males typically have daughters who are carriers and sons who are unaffected. We reviewed autosomal recessive inheritance and discussed that all offspring of an affected individual would be carriers. In the case of an underlying autosomal recessive muscular dystrophy, Ms. JAELANI POSA would have the general population chance to be a carrier (which varies by condition) given no known family history and no known consanguinity to the father of the pregnancy. In autosomal dominant inheritance, offspring of an affected individual would have a 1 in 2 (50%) chance to inherit the condition.   We discussed that in some cases, prenatal diagnosis may be available to the pregnancy,  if the underlying genetic change has first been identified in the family. However, clinical genetic testing is not available for all forms of muscular dystrophy, and clinical testing does not detect a causative mutation in all individuals with a clinical diagnosis. Thus, genetic testing is most informative when begun with an affected patient. It was not known if genetic testing had been performed for the father of the pregnancy. The patient planned to call us back once additional information was  obtained regarding the specific diagnosis in order to further refine recurrence risk for the pregnancy and understands that recurrence risk estimate is limited in the absence of information regarding the specific type of muscular dystrophy in the family.   Additionally, Ms. 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 She reported that her father also has Chiari I malformation. A Chiari I malformation is typically isolated although it may be one feature of an underlying inherited or sporadic genetic condition.  Potential genetic contributing factors  are not well understood at this time.  Historically, isolated nonsyndromic Chiari I malformations were considered sporadic and not inherited.  However, some studies in the medical literature now report multiple relatives in some families with Chiari I malformations.  Therefore, a Chiari I malformation may possess a stronger genetic component in some (not all) families. Since close relatives of an affected individual may have a slightly increased risk for a Chiari I malformation, radiologic studies may be offered to screen asymptomatic first-degree relatives and should be offered in symptomatic relatives.  It may be helpful for her child's pediatrician to be aware of this history.  It is unlikely that an Arnold-Chiari malformation type I could be detected prenatally on a second trimester targeted ultrasound.  Without further information regarding the provided family history, an accurate genetic risk cannot be calculated. Further genetic counseling is warranted if more information is obtained.  Ms. DAMARYS SPEIR denied exposure to environmental toxins or chemical agents. She denied the use of alcohol, tobacco or street drugs. She denied significant viral illnesses during the course of her pregnancy. Her medical and surgical histories were contributory for Chiari I malformation, as previously  discussed. Additionally, Ms. [place] reported a history of bipolar and depression for which she was taking Lexapro and Abilify. She reported that this was discontinued once pregnancy was discovered, and her OB plans to start these medications for her again at [redacted] weeks gestation.  There are limited data regarding use of Abilify in pregnancy. Available animal study data identified an increased risk for diaphragmatic hernia at maternal levels 10 times the recommended human dose. There are case reports of use in human pregnancy without adverse consequences. Available animal study data and human reports do not indicate an expected increased risk for birth defects associated with prenatal use of Lexapro (citalopram). Use of serotonin reuptake inhibitors late in pregnancy can be associated with a mild transient neonatal withdrawal symptoms.   Even though a limited number of medicines are known to cause birth defects, we cannot say that it is completely safe to use any medicines during pregnancy.  It is also not possible to predict any drug-drug interactions that occur, or how they might affect a pregnancy.  The use of medications is recommended in pregnancy only if the benefit to the mother (and thus the pregnancy) outweighs potential risks to the baby.  Sometimes the maternal use of medications, dictated by a medical condition, may even be more beneficial to  a pregnancy than not taking the medication(s) at all. [name] should not alter her medication use without first consulting with her physician.   I counseled Ms. ANGELIYAH KIRKEY regarding the above risks and available options.  The approximate face-to-face time with the genetic counselor was 40 minutes.  Quinn Plowman, MS Certified Genetic Counselor 10/21/2011

## 2011-11-01 ENCOUNTER — Inpatient Hospital Stay (HOSPITAL_COMMUNITY)
Admission: AD | Admit: 2011-11-01 | Discharge: 2011-11-01 | Disposition: A | Discharge status: Home / Self Care | Payer: Medicaid Other | Source: Ambulatory Visit | Attending: Obstetrics and Gynecology | Admitting: Obstetrics and Gynecology

## 2011-11-01 ENCOUNTER — Encounter (HOSPITAL_COMMUNITY): Payer: Self-pay | Admitting: *Deleted

## 2011-11-01 DIAGNOSIS — K6289 Other specified diseases of anus and rectum: Secondary | ICD-10-CM | POA: Insufficient documentation

## 2011-11-01 DIAGNOSIS — O99891 Other specified diseases and conditions complicating pregnancy: Secondary | ICD-10-CM | POA: Insufficient documentation

## 2011-11-01 DIAGNOSIS — IMO0002 Reserved for concepts with insufficient information to code with codable children: Secondary | ICD-10-CM

## 2011-11-01 DIAGNOSIS — K5900 Constipation, unspecified: Secondary | ICD-10-CM | POA: Insufficient documentation

## 2011-11-01 HISTORY — DX: Unspecified abnormal cytological findings in specimens from cervix uteri: R87.619

## 2011-11-01 HISTORY — DX: Reserved for concepts with insufficient information to code with codable children: IMO0002

## 2011-11-01 HISTORY — DX: Urinary tract infection, site not specified: N39.0

## 2011-11-01 HISTORY — DX: Chlamydial infection, unspecified: A74.9

## 2011-11-01 LAB — URINALYSIS, ROUTINE W REFLEX MICROSCOPIC
Glucose, UA: NEGATIVE mg/dL
Ketones, ur: 15 mg/dL — AB
Leukocytes, UA: NEGATIVE
Nitrite: NEGATIVE
Protein, ur: NEGATIVE mg/dL
Urobilinogen, UA: 2 mg/dL — ABNORMAL HIGH (ref 0.0–1.0)

## 2011-11-01 MED ORDER — POLYETHYLENE GLYCOL 3350 17 GM/SCOOP PO POWD: 17.0000 g | Freq: Every day | ORAL | Status: AC

## 2011-11-01 MED ORDER — HYDROCORTISONE 2.5 % RE CREA
TOPICAL_CREAM | Freq: Once | RECTAL | Status: DC
Start: 2011-11-01 — End: 2011-11-01
  Filled 2011-11-01: qty 28.35

## 2011-11-01 NOTE — MAU Note (Signed)
Started Emema at 1530 with 750cc.  Pt tolerated it well. Received about 600cc. Before Pt stated that she could not tolerate any more clamped off and removed. Pt with family member comfortable on bedside commode.

## 2011-11-01 NOTE — MAU Note (Signed)
anusol given at d/c- no barcode or code to scan. Instructed on PRN usuage.

## 2011-11-01 NOTE — MAU Provider Note (Signed)
History     CSN: [id number]  Arrival date and time: 11/01/11 1330   None     Chief Complaint  Patient presents with  . Constipation   HPI  Pt is [redacted]w[redacted]d pregnant complaining of constipation for 2 days unrelieved with laxative.  She also things she may have a hemorrhoid with rectal pain and itching.  Pt has a history of constipation prior to pregnancy.  Pt denies spotting or bleeding or UTI symptoms  Past Medical History  Diagnosis Date  . Depression     took lexapro for 6 months stopped taking about one year ago  . 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 Chronic kidney disease   . Bipolar 1 disorder   . Chiari malformation   . Migraines     Past Surgical History  Procedure Date  . Cesarean section   . Cholecystectomy   . Myringotomy   . Tonsillectomy   . Induced abortion     vaccum on oct 2009    Family History  Problem Relation Age of Onset  . Cancer Maternal Grandmother     Breast cancer  . Cancer Paternal Grandmother     Breast cancer  . Stroke Paternal Grandmother 45  . Diabetes Cousin   . Hypertension Cousin     History  Substance Use Topics  . Smoking status: Never Smoker   . Smokeless tobacco: Not on file  . Alcohol Use: Yes     on the w/e    Allergies:  Allergies  Allergen Reactions  . Hydrocodone-Acetaminophen     REACTION: rash and itching    Prescriptions prior to admission  Medication Sig Dispense Refill  . Ascorbic Acid (VITAMIN C PO) Take 1 each by mouth daily. Vitamin c gummy      . OVER THE COUNTER MEDICATION Take 1 tablet by mouth once. Pt took an OTC laxative ([employer] brand), does not know the strength. Has only taken two doses yesterday and today. (10/31/11 and 11/01/11.      . Prenatal Vit-Fe Fumarate-FA (PRENATAL MULTIVITAMIN) TABS Take 1 tablet by mouth daily.        Review of Systems  Constitutional: Negative for fever and chills.  Gastrointestinal: Positive for abdominal pain and constipation. Negative for  nausea and vomiting.   Physical Exam   Blood pressure 136/92, pulse 105, temperature 98 F (36.7 C), temperature source Oral, resp. rate 18, height 5\' 6"  (1.676 m), weight 115.395 kg (254 lb 6.4 oz), last menstrual period 06/21/2011.  Physical Exam  Nursing note and vitals reviewed. Constitutional: She is oriented to person, place, and time. She appears well-developed and well-nourished. No distress.  HENT:  Head: Normocephalic.  Eyes: Pupils are equal, round, and reactive to light.  Neck: Normal range of motion. Neck supple.  Cardiovascular: Normal rate.   Respiratory: Effort normal.  GI: Soft.       FHR audible with doppler  Genitourinary:       No hemorrhoid or perianal erythema or fissures noted  Musculoskeletal: Normal range of motion.  Neurological: She is alert and oriented to person, place, and time.  Skin: Skin is warm and dry.  Psychiatric: She has a normal mood and affect.    MAU Course  Procedures Soap suds enema given with great relief- no more abdominal pain or cramping Anusol HC cream for rectal irritation    Assessment and Plan  Constipation in pregnancy- discussed diet and Rx for Miralax to take daily Rectal  irritation- Anusol HC cream  F/u with OB appointment scheduled  LINEBERRY,SUSAN 11/01/2011, 2:29 PM

## 2011-11-01 NOTE — MAU Note (Signed)
Pt reports being constipated for the past 2 days. Taking stool softner and myralax without relief. Pt stated she feels something "like bowel" coming out of her rectum (hemerroid?).

## 2011-11-01 NOTE — MAU Note (Signed)
Been constipated last couple days.  Has tried laxatives and suppositories, has had watery stool, but feels like something is there blocking it.

## 2011-11-01 NOTE — MAU Note (Signed)
Bladder scan completed.  "331" noted.

## 2011-12-17 ENCOUNTER — Encounter: Payer: Self-pay | Admitting: Internal Medicine

## 2011-12-17 NOTE — Progress Notes (Signed)
  This patient is a CHRONIC NO-SHOW PATIENT that has a history of HYPERTENSION.  Please make sure to address hypertension during her next clinic appointment, and intervene as appropriate.    Within the AVS, please incorporate the following smartphrase: .htntips   Pertinent Data: BP Readings from Last 3 Encounters:  11/01/11 132/79  09/03/11 127/87  08/11/11 122/82    BMI: Estimated Body mass index is 41.06 kg/(m^2) as calculated from the following:   Height as of 11/01/11: 5\' 6" (1.676 m).   Weight as of 11/01/11: 254 lb 6.4 oz(115.395 kg).  Smoking History: History  Smoking status  . Never Smoker   Smokeless tobacco  . Never Used    Last Basic Metabolic Panel:    Component Value Date/Time   NA 138 07/25/2011 1615   K 3.8 07/25/2011 1615   CL 102 07/25/2011 1615   CO2 27 07/25/2011 1615   BUN 6 07/25/2011 1615   CREATININE 0.68 07/25/2011 1615   CREATININE 0.60 07/15/2011 1528   GLUCOSE 92 07/25/2011 1615   CALCIUM 9.3 07/25/2011 1615    Allergies: Allergies  Allergen Reactions  . Hydrocodone-Acetaminophen     REACTION: rash and itching

## 2012-01-02 ENCOUNTER — Other Ambulatory Visit (HOSPITAL_COMMUNITY): Payer: Self-pay | Admitting: Obstetrics and Gynecology

## 2012-01-02 DIAGNOSIS — Z331 Pregnant state, incidental: Secondary | ICD-10-CM

## 2012-01-07 ENCOUNTER — Ambulatory Visit (HOSPITAL_COMMUNITY)
Admission: RE | Admit: 2012-01-07 | Discharge: 2012-01-07 | Disposition: A | Discharge status: Home / Self Care | Payer: Medicaid Other | Source: Ambulatory Visit | Attending: Obstetrics and Gynecology | Admitting: Obstetrics and Gynecology

## 2012-01-07 DIAGNOSIS — O34219 Maternal care for unspecified type scar from previous cesarean delivery: Secondary | ICD-10-CM | POA: Insufficient documentation

## 2012-01-07 DIAGNOSIS — Z363 Encounter for antenatal screening for malformations: Secondary | ICD-10-CM | POA: Insufficient documentation

## 2012-01-07 DIAGNOSIS — Z331 Pregnant state, incidental: Secondary | ICD-10-CM

## 2012-01-07 DIAGNOSIS — Z1389 Encounter for screening for other disorder: Secondary | ICD-10-CM | POA: Insufficient documentation

## 2012-01-07 DIAGNOSIS — O358XX0 Maternal care for other (suspected) fetal abnormality and damage, not applicable or unspecified: Secondary | ICD-10-CM | POA: Insufficient documentation

## 2012-04-14 ENCOUNTER — Encounter (HOSPITAL_COMMUNITY): Payer: Self-pay

## 2012-04-15 ENCOUNTER — Encounter (HOSPITAL_COMMUNITY): Payer: Self-pay

## 2012-04-15 MED ORDER — SCOPOLAMINE 1 MG/3DAYS TD PT72
MEDICATED_PATCH | TRANSDERMAL | Status: AC
  Filled 2012-04-15: qty 1

## 2012-04-15 MED ORDER — KETOROLAC TROMETHAMINE 60 MG/2ML IM SOLN
INTRAMUSCULAR | Status: AC
  Filled 2012-04-15: qty 2

## 2012-04-16 ENCOUNTER — Encounter (HOSPITAL_COMMUNITY): Payer: Self-pay

## 2012-04-16 ENCOUNTER — Encounter (HOSPITAL_COMMUNITY)
Admission: RE | Admit: 2012-04-16 | Discharge: 2012-04-16 | Disposition: A | Discharge status: Home / Self Care | Payer: Medicaid Other | Source: Ambulatory Visit | Attending: Obstetrics and Gynecology | Admitting: Obstetrics and Gynecology

## 2012-04-16 HISTORY — DX: Anemia, unspecified: D64.9

## 2012-04-16 HISTORY — DX: Dyslexia and alexia: R48.0

## 2012-04-16 LAB — RPR: RPR Ser Ql: NONREACTIVE

## 2012-04-16 LAB — TYPE AND SCREEN
ABO/RH(D): O POS
Antibody Screen: NEGATIVE

## 2012-04-16 LAB — CBC
HCT: 34.8 % — ABNORMAL LOW (ref 36.0–46.0)
MCHC: 31.6 g/dL (ref 30.0–36.0)
Platelets: 278 10*3/uL (ref 150–400)
RDW: 14.9 % (ref 11.5–15.5)
WBC: 11.1 10*3/uL — ABNORMAL HIGH (ref 4.0–10.5)

## 2012-04-16 NOTE — Pre-Procedure Instructions (Signed)
Per Brayton Caves, requested anesthesia paperwork from 2006 general anesthesia c/s.  Also sent fax to neurologist - Dr Levert Feinstein to see if patient is safe for spinal or would general anesthesia better for her related to her chiari malformation.  Faxed was transmitted of but have not heard back from neurologist.

## 2012-04-16 NOTE — Patient Instructions (Addendum)
   Your procedure is scheduled on: Wednesday, March 5  Enter through the [employer] [facility] at:  745 am Pick up the phone at the desk and dial [phone] and inform us of your arrival.  Please call this number if you have any problems the morning of surgery: [phone]  Remember: Do not eat or drink after midnight:  Tuesday Take these medicines the morning of surgery with a SIP OF WATER:  none  Do not wear jewelry, make-up, or FINGER nail polish No metal in your hair or on your body. Do not wear lotions, powders, perfumes. You may wear deodorant.  Please use your CHG wash as directed prior to surgery.  Do not shave anywhere for at least 12 hours prior to first CHG shower.  Do not bring valuables to the hospital. Contacts, dentures or bridgework may not be worn into surgery.  Leave suitcase in the car. After Surgery it may be brought to your room. For patients being admitted to the hospital, checkout time is 11:00am the day of discharge.   Home with mother Gloriajean Dell.

## 2012-04-19 ENCOUNTER — Inpatient Hospital Stay (HOSPITAL_COMMUNITY)
Admission: AD | Admit: 2012-04-19 | Discharge: 2012-04-19 | Disposition: A | Discharge status: Home / Self Care | Payer: Medicaid Other | Source: Ambulatory Visit | Attending: Obstetrics and Gynecology | Admitting: Obstetrics and Gynecology

## 2012-04-19 ENCOUNTER — Encounter (HOSPITAL_COMMUNITY): Payer: Self-pay | Admitting: *Deleted

## 2012-04-19 DIAGNOSIS — R42 Dizziness and giddiness: Secondary | ICD-10-CM | POA: Insufficient documentation

## 2012-04-19 DIAGNOSIS — O99891 Other specified diseases and conditions complicating pregnancy: Secondary | ICD-10-CM | POA: Insufficient documentation

## 2012-04-19 DIAGNOSIS — R030 Elevated blood-pressure reading, without diagnosis of hypertension: Secondary | ICD-10-CM | POA: Insufficient documentation

## 2012-04-19 NOTE — MAU Provider Note (Signed)
Chief Complaint:  Hypertension and Abdominal Pain   First Martha Mathews Initiated Contact with Patient 04/19/12 2042     HPI: Martha Mathews is a 27 y.o. G3P1011 at [redacted]w[redacted]d who presents to maternity admissions reporting new onset of  dizziness and not feeling well this afternoon. Also reports HA and blurry vision identical to those from Chiari malformation. Denies epigastric pain, fever, leakage of fluid or vaginal bleeding. Good fetal movement.   Past Medical History: Past Medical History  Diagnosis Date  . 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 Bipolar 1 disorder   . 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 Urinary tract infection   . Depression     not currently on meds, being treated- trying to find a med  . Abnormal Pap smear   . Chlamydia 2004  . Anemia     HISTORY W/2006 PREGNANCY  . Dyslexia   . Migraines     otc med prn w/pregnancy    Past obstetric history: OB History   Grav Para Term Preterm Abortions TAB SAB Ect Mult Living   3 1 1  0 1 1    1      # Outc Date GA Lbr Len/2nd Wgt Sex Del Anes PTL Lv   1 TRM 6/06 [redacted]w[redacted]d   M LTCS Gen  Yes   Comments: emergency c/s, had gallstones- liver enzymes were going up -UNABLE TO DO SPINAL/E[IDURAL R/T CHIARA MALIFORMATION, GALLSTONES   2 TAB 11/10 [redacted]w[redacted]d          Comments: ELECTIVE TAB   3 CUR               Past Surgical History: Past Surgical History  Procedure Laterality Date  . Cesarean section    . Myringotomy    . Tonsillectomy    . Induced abortion      vaccum on oct 2009  . Cholecystectomy  2006  . Eye surgery      left eye surgery - lazy eye    Family History: Family History  Problem Relation Age of Onset  . Cancer Maternal Grandmother     Breast cancer  . Cancer Paternal Grandmother     Breast cancer  . Stroke Paternal Grandmother 54  . Diabetes Cousin   . Hypertension Cousin   . Other Neg Hx     Social History: History  Substance Use Topics  . Smoking status: Never Smoker    . Smokeless tobacco: Never Used  . Alcohol Use: Yes     Comment: socially but none with pregnancy    Allergies:  Allergies  Allergen Reactions  . Hydrocodone-Acetaminophen     REACTION: rash and itching    Meds:  Prescriptions prior to admission  Medication Sig Dispense Refill  . diphenhydramine-acetaminophen (TYLENOL PM) 25-500 MG TABS Take 1 tablet by mouth at bedtime as needed (sleep and leg pain).        ROS: Pertinent findings in history of present illness.  Physical Exam  Blood pressure 137/76, pulse 93, temperature 99.8 F (37.7 C), temperature source Oral, resp. rate 20, height 5\' 6"  (1.676 m), weight 120.203 kg (265 lb), last menstrual period 06/21/2011. GENERAL: Well-developed, well-nourished female in no acute distress.  HEENT: normocephalic HEART: normal rate RESP: normal effort ABDOMEN: Soft, non-tender, gravid appropriate for gestational age EXTREMITIES: Nontender, no edema NEURO: alert and oriented SPECULUM EXAM: NEFG, physiologic discharge, no blood, cervix clean    FHT:  Baseline  155 , moderate variability, accelerations present, no decelerations Contractions: irreg, mild   Labs: Results for orders placed during the hospital encounter of 04/19/12 (from the past 24 hour(s))  GLUCOSE, CAPILLARY     Status: Abnormal   Collection Time    04/19/12  9:06 PM      Result Value Range   Glucose-Capillary 106 (*) 70 - 99 mg/dL    Imaging:  No results found. MAU Course: 2213: Dizziness resolved w/ PO fluids and crackers. Ready to go home.  Assessment: 1. Orthostatic lightheadedness     Plan: Discharge home Labor precautions and fetal kick counts     Follow-up Information   Follow up with [facility] OPERATING ROOM On 04/21/2012.   Contact information:   [address] [place] [address] [phone]      Follow up with [facility] [place] MATERNITY ADMISSIONS. (As needed)    Contact information:   [address] [place] [address] [phone]       Medication List    TAKE these medications       diphenhydramine-acetaminophen 25-500 MG Tabs  Commonly known as:  TYLENOL PM  Take 1 tablet by mouth at bedtime as needed (sleep and leg pain).        [place], [place] 04/19/2012 10:22 PM

## 2012-04-19 NOTE — MAU Note (Signed)
Was seen in the office today BP was elevated. R/o preE labs were normal today but was told to call if she headache or blurred vision. Has a really bad headache and was seeing spots. Called Dr. Jackelyn Knife and was told to come and have BP recheck. Scheduled for a repeat c/s on Wednesday.

## 2012-04-20 MED ORDER — DEXTROSE 5 % IV SOLN
3.0000 g | INTRAVENOUS | Status: AC
  Administered 2012-04-21: 3 g via INTRAVENOUS
  Filled 2012-04-20: qty 3000

## 2012-04-21 ENCOUNTER — Encounter (HOSPITAL_COMMUNITY): Payer: Self-pay | Admitting: Anesthesiology

## 2012-04-21 ENCOUNTER — Encounter (HOSPITAL_COMMUNITY)
Admission: RE | Disposition: A | Discharge status: Home / Self Care | Payer: Self-pay | Source: Ambulatory Visit | Attending: Obstetrics and Gynecology

## 2012-04-21 ENCOUNTER — Inpatient Hospital Stay (HOSPITAL_COMMUNITY)
Admission: RE | Admit: 2012-04-21 | Discharge: 2012-04-24 | DRG: 765 | Disposition: A | Discharge status: Home / Self Care | Payer: Medicaid Other | Source: Ambulatory Visit | Attending: Obstetrics and Gynecology | Admitting: Obstetrics and Gynecology

## 2012-04-21 ENCOUNTER — Inpatient Hospital Stay (HOSPITAL_COMMUNITY): Payer: Medicaid Other | Admitting: Anesthesiology

## 2012-04-21 DIAGNOSIS — O34219 Maternal care for unspecified type scar from previous cesarean delivery: Principal | ICD-10-CM | POA: Diagnosis present

## 2012-04-21 DIAGNOSIS — Z302 Encounter for sterilization: Secondary | ICD-10-CM

## 2012-04-21 DIAGNOSIS — O99892 Other specified diseases and conditions complicating childbirth: Secondary | ICD-10-CM | POA: Diagnosis present

## 2012-04-21 DIAGNOSIS — G935 Compression of brain: Secondary | ICD-10-CM | POA: Diagnosis present

## 2012-04-21 DIAGNOSIS — Z98891 History of uterine scar from previous surgery: Secondary | ICD-10-CM

## 2012-04-21 HISTORY — PX: BILATERAL SALPINGECTOMY: SHX5743

## 2012-04-21 LAB — TYPE AND SCREEN: Antibody Screen: NEGATIVE

## 2012-04-21 SURGERY — Surgical Case
Anesthesia: Epidural | Site: Abdomen | Wound class: Clean Contaminated

## 2012-04-21 MED ORDER — MEPERIDINE HCL 25 MG/ML IJ SOLN
INTRAMUSCULAR | Status: AC
  Administered 2012-04-21: 6.25 mg via INTRAVENOUS
  Filled 2012-04-21: qty 1

## 2012-04-21 MED ORDER — DIPHENHYDRAMINE HCL 25 MG PO CAPS: 25.0000 mg | ORAL_CAPSULE | Freq: Four times a day (QID) | ORAL | Status: DC | PRN

## 2012-04-21 MED ORDER — FENTANYL CITRATE 0.05 MG/ML IJ SOLN
INTRAMUSCULAR | Status: AC
  Filled 2012-04-21: qty 2

## 2012-04-21 MED ORDER — ONDANSETRON HCL 4 MG/2ML IJ SOLN: 4.0000 mg | INTRAMUSCULAR | Status: DC | PRN

## 2012-04-21 MED ORDER — MENTHOL 3 MG MT LOZG: 1.0000 | LOZENGE | OROMUCOSAL | Status: DC | PRN

## 2012-04-21 MED ORDER — MEASLES, MUMPS & RUBELLA VAC ~~LOC~~ INJ
0.5000 mL | INJECTION | Freq: Once | SUBCUTANEOUS | Status: DC
  Filled 2012-04-21: qty 0.5

## 2012-04-21 MED ORDER — LANOLIN HYDROUS EX OINT: 1.0000 "application " | TOPICAL_OINTMENT | CUTANEOUS | Status: DC | PRN

## 2012-04-21 MED ORDER — LACTATED RINGERS IV SOLN
INTRAVENOUS | Status: DC
  Administered 2012-04-21 (×4): via INTRAVENOUS

## 2012-04-21 MED ORDER — TETANUS-DIPHTH-ACELL PERTUSSIS 5-2.5-18.5 LF-MCG/0.5 IM SUSP: 0.5000 mL | Freq: Once | INTRAMUSCULAR | Status: DC

## 2012-04-21 MED ORDER — NALBUPHINE SYRINGE 5 MG/0.5 ML
5.0000 mg | INJECTION | INTRAMUSCULAR | Status: DC | PRN
  Administered 2012-04-21 (×2): 10 mg via INTRAVENOUS
  Filled 2012-04-21 (×3): qty 1

## 2012-04-21 MED ORDER — LACTATED RINGERS IV SOLN: INTRAVENOUS | Status: DC

## 2012-04-21 MED ORDER — SIMETHICONE 80 MG PO CHEW: 80.0000 mg | CHEWABLE_TABLET | ORAL | Status: DC | PRN

## 2012-04-21 MED ORDER — KETOROLAC TROMETHAMINE 30 MG/ML IJ SOLN: 30.0000 mg | Freq: Four times a day (QID) | INTRAMUSCULAR | Status: AC | PRN

## 2012-04-21 MED ORDER — NALOXONE HCL 0.4 MG/ML IJ SOLN: 0.4000 mg | INTRAMUSCULAR | Status: DC | PRN

## 2012-04-21 MED ORDER — DIPHENHYDRAMINE HCL 50 MG/ML IJ SOLN: 25.0000 mg | INTRAMUSCULAR | Status: DC | PRN

## 2012-04-21 MED ORDER — ONDANSETRON HCL 4 MG/2ML IJ SOLN: 4.0000 mg | Freq: Three times a day (TID) | INTRAMUSCULAR | Status: DC | PRN

## 2012-04-21 MED ORDER — MEPERIDINE HCL 25 MG/ML IJ SOLN
6.2500 mg | INTRAMUSCULAR | Status: AC | PRN
  Administered 2012-04-21: 6.25 mg via INTRAVENOUS

## 2012-04-21 MED ORDER — SENNOSIDES-DOCUSATE SODIUM 8.6-50 MG PO TABS
2.0000 | ORAL_TABLET | Freq: Every day | ORAL | Status: DC
  Administered 2012-04-21 – 2012-04-23 (×3): 2 via ORAL

## 2012-04-21 MED ORDER — IBUPROFEN 600 MG PO TABS
600.0000 mg | ORAL_TABLET | Freq: Four times a day (QID) | ORAL | Status: DC
  Administered 2012-04-21 – 2012-04-24 (×11): 600 mg via ORAL
  Filled 2012-04-21 (×2): qty 1

## 2012-04-21 MED ORDER — OXYTOCIN 10 UNIT/ML IJ SOLN
40.0000 [IU] | INTRAVENOUS | Status: DC | PRN
  Administered 2012-04-21: 10:00:00 via INTRAVENOUS

## 2012-04-21 MED ORDER — PHENYLEPHRINE HCL 10 MG/ML IJ SOLN
INTRAMUSCULAR | Status: DC | PRN
  Administered 2012-04-21 (×5): 40 ug via INTRAVENOUS

## 2012-04-21 MED ORDER — LACTATED RINGERS IV SOLN
INTRAVENOUS | Status: DC
  Administered 2012-04-21 (×2): via INTRAVENOUS

## 2012-04-21 MED ORDER — SIMETHICONE 80 MG PO CHEW
80.0000 mg | CHEWABLE_TABLET | Freq: Three times a day (TID) | ORAL | Status: DC
  Administered 2012-04-21 – 2012-04-24 (×11): 80 mg via ORAL

## 2012-04-21 MED ORDER — SCOPOLAMINE 1 MG/3DAYS TD PT72: 1.0000 | MEDICATED_PATCH | Freq: Once | TRANSDERMAL | Status: DC

## 2012-04-21 MED ORDER — ZOLPIDEM TARTRATE 5 MG PO TABS: 5.0000 mg | ORAL_TABLET | Freq: Every evening | ORAL | Status: DC | PRN

## 2012-04-21 MED ORDER — SODIUM CHLORIDE 0.9 % IJ SOLN: 3.0000 mL | INTRAMUSCULAR | Status: DC | PRN

## 2012-04-21 MED ORDER — MAGNESIUM HYDROXIDE 400 MG/5ML PO SUSP: 30.0000 mL | ORAL | Status: DC | PRN

## 2012-04-21 MED ORDER — ONDANSETRON HCL 4 MG/2ML IJ SOLN
INTRAMUSCULAR | Status: AC
  Filled 2012-04-21: qty 2

## 2012-04-21 MED ORDER — 0.9 % SODIUM CHLORIDE (POUR BTL) OPTIME
TOPICAL | Status: DC | PRN
  Administered 2012-04-21: 1000 mL

## 2012-04-21 MED ORDER — MORPHINE SULFATE (PF) 0.5 MG/ML IJ SOLN
INTRAMUSCULAR | Status: DC | PRN
  Administered 2012-04-21: 3 mg via EPIDURAL

## 2012-04-21 MED ORDER — SCOPOLAMINE 1 MG/3DAYS TD PT72
MEDICATED_PATCH | TRANSDERMAL | Status: AC
  Administered 2012-04-21: 1.5 mg via TRANSDERMAL
  Filled 2012-04-21: qty 1

## 2012-04-21 MED ORDER — KETOROLAC TROMETHAMINE 30 MG/ML IJ SOLN
INTRAMUSCULAR | Status: AC
  Administered 2012-04-21: 30 mg via INTRAVENOUS
  Filled 2012-04-21: qty 1

## 2012-04-21 MED ORDER — PHENYLEPHRINE 40 MCG/ML (10ML) SYRINGE FOR IV PUSH (FOR BLOOD PRESSURE SUPPORT)
PREFILLED_SYRINGE | INTRAVENOUS | Status: AC
  Filled 2012-04-21: qty 5

## 2012-04-21 MED ORDER — MEPERIDINE HCL 25 MG/ML IJ SOLN: 6.2500 mg | INTRAMUSCULAR | Status: DC | PRN

## 2012-04-21 MED ORDER — ONDANSETRON HCL 4 MG/2ML IJ SOLN
INTRAMUSCULAR | Status: DC | PRN
  Administered 2012-04-21: 4 mg via INTRAVENOUS

## 2012-04-21 MED ORDER — NALOXONE HCL 1 MG/ML IJ SOLN
1.0000 ug/kg/h | INTRAVENOUS | Status: DC | PRN
  Filled 2012-04-21: qty 2

## 2012-04-21 MED ORDER — HYDROMORPHONE HCL 2 MG PO TABS
4.0000 mg | ORAL_TABLET | ORAL | Status: DC | PRN
  Administered 2012-04-21 – 2012-04-24 (×7): 4 mg via ORAL
  Filled 2012-04-21: qty 2
  Filled 2012-04-21 (×3): qty 1
  Filled 2012-04-21: qty 2
  Filled 2012-04-21 (×3): qty 1
  Filled 2012-04-21 (×2): qty 2

## 2012-04-21 MED ORDER — DIPHENHYDRAMINE HCL 25 MG PO CAPS
25.0000 mg | ORAL_CAPSULE | ORAL | Status: DC | PRN
  Administered 2012-04-23 (×2): 25 mg via ORAL
  Filled 2012-04-21 (×3): qty 1

## 2012-04-21 MED ORDER — FENTANYL CITRATE 0.05 MG/ML IJ SOLN: 25.0000 ug | INTRAMUSCULAR | Status: DC | PRN

## 2012-04-21 MED ORDER — OXYTOCIN 10 UNIT/ML IJ SOLN
INTRAMUSCULAR | Status: AC
  Filled 2012-04-21: qty 4

## 2012-04-21 MED ORDER — DIPHENHYDRAMINE HCL 50 MG/ML IJ SOLN: 12.5000 mg | INTRAMUSCULAR | Status: DC | PRN

## 2012-04-21 MED ORDER — OXYTOCIN 40 UNITS IN LACTATED RINGERS INFUSION - SIMPLE MED: 62.5000 mL/h | INTRAVENOUS | Status: AC

## 2012-04-21 MED ORDER — METOCLOPRAMIDE HCL 5 MG/ML IJ SOLN: 10.0000 mg | Freq: Three times a day (TID) | INTRAMUSCULAR | Status: DC | PRN

## 2012-04-21 MED ORDER — FENTANYL CITRATE 0.05 MG/ML IJ SOLN
INTRAMUSCULAR | Status: DC | PRN
  Administered 2012-04-21: 100 ug via EPIDURAL
  Administered 2012-04-21 (×3): 50 ug via INTRAVENOUS

## 2012-04-21 MED ORDER — PRENATAL MULTIVITAMIN CH
1.0000 | ORAL_TABLET | Freq: Every day | ORAL | Status: DC
  Administered 2012-04-22 – 2012-04-24 (×3): 1 via ORAL
  Filled 2012-04-21 (×2): qty 1

## 2012-04-21 MED ORDER — NALBUPHINE SYRINGE 5 MG/0.5 ML
5.0000 mg | INJECTION | INTRAMUSCULAR | Status: DC | PRN
  Filled 2012-04-21: qty 1

## 2012-04-21 MED ORDER — WITCH HAZEL-GLYCERIN EX PADS: 1.0000 "application " | MEDICATED_PAD | CUTANEOUS | Status: DC | PRN

## 2012-04-21 MED ORDER — MORPHINE SULFATE 0.5 MG/ML IJ SOLN
INTRAMUSCULAR | Status: AC
  Filled 2012-04-21: qty 10

## 2012-04-21 MED ORDER — DIBUCAINE 1 % RE OINT: 1.0000 "application " | TOPICAL_OINTMENT | RECTAL | Status: DC | PRN

## 2012-04-21 MED ORDER — ONDANSETRON HCL 4 MG PO TABS: 4.0000 mg | ORAL_TABLET | ORAL | Status: DC | PRN

## 2012-04-21 MED ORDER — IBUPROFEN 600 MG PO TABS
600.0000 mg | ORAL_TABLET | Freq: Four times a day (QID) | ORAL | Status: DC | PRN
  Administered 2012-04-24: 600 mg via ORAL
  Filled 2012-04-21 (×9): qty 1

## 2012-04-21 MED ORDER — METOCLOPRAMIDE HCL 5 MG/ML IJ SOLN: 10.0000 mg | Freq: Once | INTRAMUSCULAR | Status: DC | PRN

## 2012-04-21 MED ORDER — SODIUM BICARBONATE 8.4 % IV SOLN
INTRAVENOUS | Status: DC | PRN
  Administered 2012-04-21: 10 mL via EPIDURAL

## 2012-04-21 SURGICAL SUPPLY — 35 items
BENZOIN TINCTURE PRP APPL 2/3 (GAUZE/BANDAGES/DRESSINGS) ×6 IMPLANT
CLOTH BEACON ORANGE TIMEOUT ST (SAFETY) ×3 IMPLANT
CONTAINER PREFILL 10% NBF 15ML (MISCELLANEOUS) IMPLANT
DRAPE LG THREE QUARTER DISP (DRAPES) ×3 IMPLANT
DRSG OPSITE POSTOP 4X10 (GAUZE/BANDAGES/DRESSINGS) ×3 IMPLANT
DURAPREP 26ML APPLICATOR (WOUND CARE) ×3 IMPLANT
ELECT REM PT RETURN 9FT ADLT (ELECTROSURGICAL) ×3
ELECTRODE REM PT RTRN 9FT ADLT (ELECTROSURGICAL) ×2 IMPLANT
EXTRACTOR VACUUM KIWI (MISCELLANEOUS) IMPLANT
EXTRACTOR VACUUM M CUP 4 TUBE (SUCTIONS) IMPLANT
GLOVE BIO SURGEON STRL SZ7.5 (GLOVE) ×3 IMPLANT
GLOVE BIO SURGEON STRL SZ8 (GLOVE) ×3 IMPLANT
GLOVE BIOGEL PI IND STRL 7.0 (GLOVE) ×2 IMPLANT
GLOVE BIOGEL PI INDICATOR 7.0 (GLOVE) ×1
GLOVE INDICATOR 7.0 STRL GRN (GLOVE) ×9 IMPLANT
GLOVE ORTHO TXT STRL SZ7.5 (GLOVE) ×3 IMPLANT
GOWN STRL REIN XL XLG (GOWN DISPOSABLE) ×6 IMPLANT
KIT ABG SYR 3ML LUER SLIP (SYRINGE) IMPLANT
NEEDLE HYPO 25X5/8 SAFETYGLIDE (NEEDLE) ×3 IMPLANT
NS IRRIG 1000ML POUR BTL (IV SOLUTION) ×3 IMPLANT
PACK C SECTION WH (CUSTOM PROCEDURE TRAY) ×3 IMPLANT
PAD OB MATERNITY 4.3X12.25 (PERSONAL CARE ITEMS) ×3 IMPLANT
RTRCTR C-SECT PINK 25CM LRG (MISCELLANEOUS) ×3 IMPLANT
SLEEVE SCD COMPRESS KNEE MED (MISCELLANEOUS) IMPLANT
STAPLER VISISTAT 35W (STAPLE) IMPLANT
STRIP CLOSURE SKIN 1/2X4 (GAUZE/BANDAGES/DRESSINGS) ×6 IMPLANT
SUT CHROMIC 1 CTX 36 (SUTURE) ×6 IMPLANT
SUT PLAIN 0 NONE (SUTURE) ×3 IMPLANT
SUT PLAIN 2 0 XLH (SUTURE) ×6 IMPLANT
SUT VIC AB 0 CT1 27 (SUTURE) ×2
SUT VIC AB 0 CT1 27XBRD ANBCTR (SUTURE) ×4 IMPLANT
SUT VIC AB 4-0 KS 27 (SUTURE) ×3 IMPLANT
TOWEL OR 17X24 6PK STRL BLUE (TOWEL DISPOSABLE) ×9 IMPLANT
TRAY FOLEY CATH 14FR (SET/KITS/TRAYS/PACK) ×3 IMPLANT
WATER STERILE IRR 1000ML POUR (IV SOLUTION) ×3 IMPLANT

## 2012-04-21 NOTE — Anesthesia Procedure Notes (Signed)
Epidural Patient location during procedure: OB Start time: 04/21/2012 9:27 AM  Staffing Anesthesiologist: FOSTER, MICHAEL A. Performed by: anesthesiologist   Preanesthetic Checklist Completed: patient identified, site marked, surgical consent, pre-op evaluation, timeout performed, IV checked, risks and benefits discussed and monitors and equipment checked  Epidural Patient position: sitting Prep: site prepped and draped and DuraPrep Patient monitoring: continuous pulse ox and blood pressure Approach: midline Injection technique: LOR air  Needle:  Needle type: Tuohy  Needle gauge: 17 G Needle length: 9 cm and 9 Needle insertion depth: 8 cm Catheter type: closed end flexible Catheter size: 19 Gauge Catheter at skin depth: 14 cm Test dose: negative and 2% lidocaine with Epi 1:200 K  Assessment Sensory level: T4 Events: blood not aspirated, injection not painful, no injection resistance, negative IV test and no paresthesia  Additional Notes Patient identified. 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  All questions were answered. Patient expressed understanding and wished to proceed.  Sterile technique was used throughout procedure. Attempt x 2. Somewhat difficult due to Morbid Obesity. Epidural site was dressed with sterile barrier dressing. No paresthesias, signs of intravascular injection Or signs of intrathecal spread were encountered. Adequate sensory level obtained.

## 2012-04-21 NOTE — H&P (Signed)
Martha Mathews is a 28 y.o. female, G3 P1011, EGA [redacted] weeks with EDC 3-12 presenting for repeat c-section.  Considered VBAC, but unable to have regional anesthesia due to Chiari malformation so has elected to proceed with repeat c-section and BTL.  Had slightly elevated BP at last visit and did not feel well.  PIH labs were normal, recheck of BP in MAU was normal.  See prenatal records for complete history.  Maternal Medical History:  Fetal activity: Perceived fetal activity is normal.      OB History   Grav Para Term Preterm Abortions TAB SAB Ect Mult Living   3 1 1  0 1 1    1     LTCS at 39 weeks, 7 lbs 5 oz  Past Medical History  Diagnosis Date  . 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 Bipolar 1 disorder   . 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 Urinary tract infection   . Depression     not currently on meds, being treated- trying to find a med  . Abnormal Pap smear   . Chlamydia 2004  . Anemia     HISTORY W/2006 PREGNANCY  . Dyslexia   . Migraines     otc med prn w/pregnancy   Past Surgical History  Procedure Laterality Date  . Cesarean section    . Myringotomy    . Tonsillectomy    . Induced abortion      vaccum on oct 2009  . Cholecystectomy  2006  . Eye surgery      left eye surgery - lazy eye   Family History: family history includes Cancer in her maternal grandmother and paternal grandmother; Diabetes in her cousin; Hypertension in her cousin; and Stroke (age of onset: 54) in her paternal grandmother.  There is no history of Other. Social History:  reports that she has never smoked. She has never used smokeless tobacco. She reports that  drinks alcohol. She reports that she does not use illicit drugs.   Prenatal Transfer Tool  Maternal Diabetes: No Genetic Screening: Normal Maternal Ultrasounds/Referrals: Normal Fetal Ultrasounds or other Referrals:  None Maternal Substance Abuse:  No Significant Maternal Medications:   None Significant Maternal Lab Results:  Lab values include: Group B Strep negative Other Comments:  repeat c-section with general anesthesia  Review of Systems  Respiratory: Negative.   Cardiovascular: Negative.       Blood pressure 137/83, temperature 97.3 F (36.3 C), temperature source Oral, resp. rate 18, last menstrual period 06/21/2011, SpO2 100.00%. Maternal Exam:  Abdomen: Patient reports no abdominal tenderness. Surgical scars: low transverse.   Estimated fetal weight is 7 1/2 lbs.   Fetal presentation: vertex  Introitus: Normal vulva. Normal vagina.  Cervix: Cervix evaluated by digital exam.     Physical Exam  Constitutional: She appears well-developed and well-nourished.  Neck: Neck supple. No thyromegaly present.  Cardiovascular: Normal rate, regular rhythm and normal heart sounds.   No murmur heard. Respiratory: Effort normal and breath sounds normal. No respiratory distress. She has no wheezes.  GI: Soft.  Gravid     Prenatal labs: ABO, Rh: --/--/O POS, O POS (02/28 1120) Antibody: NEG (02/28 1120) Rubella: Immune (08/22 0000) RPR: NON REACTIVE (02/28 1120)  HBsAg: Negative (08/22 0000)  HIV: Non-reactive (08/22 0000)  GBS:   neg GCT:  nl  Assessment/Plan: IUP at 39 weeks with previous c-section and desires surgical sterility.  Procedure, risks,  permanency, failure rate and options for sterility discussed.  Will proceed with repeat c-section and bilateral distal salpingectomy.   MEISINGER,TODD D 04/21/2012, 8:28 AM

## 2012-04-21 NOTE — Anesthesia Postprocedure Evaluation (Signed)
  Anesthesia Post-op Note  Patient: Martha Mathews  Procedure(s) Performed: Procedure(s) with comments: CESAREAN SECTION (N/A) - Repeat BILATERAL SALPINGECTOMY (Bilateral)  Patient Location: PACU  Anesthesia Type:Epidural  Level of Consciousness: awake, alert  and oriented  Airway and Oxygen Therapy: Patient Spontanous Breathing  Post-op Pain: none  Post-op Assessment: Post-op Vital signs reviewed, Patient's Cardiovascular Status Stable, Respiratory Function Stable, Patent Airway, No signs of Nausea or vomiting, Pain level controlled, No headache, No backache, No residual numbness and No residual motor weakness  Post-op Vital Signs: Reviewed and stable  Complications: No apparent anesthesia complications

## 2012-04-21 NOTE — Transfer of Care (Signed)
Immediate Anesthesia Transfer of Care Note  Patient: Martha Mathews  Procedure(s) Performed: Procedure(s) with comments: CESAREAN SECTION (N/A) - Repeat BILATERAL SALPINGECTOMY (Bilateral)  Patient Location: PACU  Anesthesia Type:Epidural  Level of Consciousness: awake, alert  and oriented  Airway & Oxygen Therapy: Patient Spontanous Breathing  Post-op Assessment: Report given to PACU RN and Post -op Vital signs reviewed and stable  Post vital signs: stable  Complications: No apparent anesthesia complications

## 2012-04-21 NOTE — Anesthesia Preprocedure Evaluation (Signed)
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    GI/Hepatic GERD-  Medicated and Controlled,Hx/o elevated LFT's    Endo/Other  Morbid obesity  Renal/GU negative Renal ROS  negative genitourinary   Musculoskeletal negative musculoskeletal ROS (+)   Abdominal (+) + obese,   Peds  Hematology negative hematology ROS (+)   Anesthesia Other Findings   Reproductive/Obstetrics (+) Pregnancy Previous C/Section- GA                           Anesthesia Physical Anesthesia Plan  ASA: III  Anesthesia Plan: Epidural   Post-op Pain Management:    Induction:   Airway Management Planned: Natural Airway  Additional Equipment:   Intra-op Plan:   Post-operative Plan:   Informed Consent: I have reviewed the patients History and Physical, chart, labs and discussed the procedure including the risks, benefits and alternatives for the proposed anesthesia with the patient or authorized representative who has indicated his/her understanding and acceptance.   Dental advisory given  Plan Discussed with: Anesthesiologist, CRNA and Surgeon  Anesthesia Plan Comments:         Anesthesia Quick Evaluation

## 2012-04-21 NOTE — Interval H&P Note (Signed)
History and Physical Interval Note:  04/21/2012 8:36 AM  Martha Mathews  has presented today for surgery, with the diagnosis of previous c/s  The various methods of treatment have been discussed with the patient and family. After consideration of risks, benefits and other options for treatment, the patient has consented to  Procedure(s) with comments: CESAREAN SECTION (N/A) - Repeat as a surgical intervention .  The patient's history has been reviewed, patient examined, no change in status, stable for surgery.  I have reviewed the patient's chart and labs.  Questions were answered to the patient's satisfaction. Will do bilateral distal salpingectomy as well.  Dr. Malen Gauze is going to do try an epidural.      [name] D

## 2012-04-21 NOTE — Op Note (Signed)
Preoperative diagnosis: Intrauterine pregnancy at 39 weeks, previous cesarean section, desires surgical sterility Postoperative diagnosis: Same Procedure: Repeat low transverse cesarean section without extensions, bilateral distal salpingectomy Surgeon: Lavina Hamman M.D. Assistant:  Tracey Harries, MD Anesthesia: Epidural Findings: Patient had normal gravid anatomy and delivered a viable female infant with Apgars of 9 and 9 weight pending Estimated blood loss: 800 cc Specimens: Placenta sent to labor and delivery Complications: None  Procedure in detail: The patient was taken to the operating room and placed in the sitting position.  Dr. Malen Gauze placed an epidural.  She was then placed in the dorsosupine position with left tilt. Abdomen was then prepped and draped in the usual sterile fashion, and a foley catheter was inserted. The level of her anesthesia was found to be adequate. Abdomen was entered via a standard Pfannenstiel incision through her previous scar. Once the peritoneal cavity was entered the Alexis disposable self-retaining retractor was placed and good visualization was achieved. A 4 cm transverse incision was then made in the lower uterine segment pushing the bladder inferior. Once the uterine cavity was entered the incision was extended digitally. The fetal vertex was grasped and delivered through the incision atraumatically. Mouth and nares were suctioned. The remainder of the infant then delivered atraumatically. Cord was doubly clamped and cut and the infant handed to the awaiting pediatric team. Cord blood was obtained. The placenta delivered spontaneously. Uterus was wiped dry with clean lap pad and all clots and debris were removed. Uterine incision was inspected and found to be free of extensions. Uterine incision was closed in 1 layer with running locking #1 Chromic. Bleeding from the right angle was controlled with 2 figure 8 sutures of #1 Chromic.  Tubes and ovaries were  inspected and found to be normal. Uterine incision was inspected and found to be hemostatic. Bleeding from serosal edges was controlled with electrocautery.   Attention was turned to distal salpingectomy.  The distal end of each tube was grasped with a babcock clamp and elevated.  The fimbria was isolated from the ovary with the bovie.  A Kelly clamp was then placed across the distal tube and it was removed sharply.  The stump was ligated with 0 plain gut.  The stumps were hemostatic.  The Alexis retractor was removed. Subfascial space was irrigated and made hemostatic with electrocautery. Fascia was closed in running fashion starting at both ends and meeting in the middle with 0 Vicryl. Subcutaneous tissue was then irrigated and made hemostatic with electrocautery, then closed with running 2-0 plain gut. Skin was closed with running subcuticular 4-0 Vicryl followed by a sterile dressing. Patient tolerated the procedure well and was taken to the recovery in stable condition. Counts were correct x2, she received Ancef 3 g IV at the beginning of the procedure and she had PAS hose on throughout the procedure.

## 2012-04-21 NOTE — Anesthesia Postprocedure Evaluation (Signed)
  Anesthesia Post-op Note  Patient: Martha Mathews  Procedure(s) Performed: Procedure(s) with comments: CESAREAN SECTION (N/A) - Repeat BILATERAL SALPINGECTOMY (Bilateral)  Patient Location: PACU and Mother/Baby  Anesthesia Type:Epidural  Level of Consciousness: awake, alert  and oriented  Airway and Oxygen Therapy: Patient Spontanous Breathing  Post-op Pain: none  Post-op Assessment: Post-op Vital signs reviewed  Post-op Vital Signs: Reviewed and stable  Complications: No apparent anesthesia complications

## 2012-04-22 ENCOUNTER — Encounter (HOSPITAL_COMMUNITY): Payer: Self-pay | Admitting: Obstetrics and Gynecology

## 2012-04-22 LAB — CBC
HCT: 30.4 % — ABNORMAL LOW (ref 36.0–46.0)
MCV: 82.6 fL (ref 78.0–100.0)
RBC: 3.68 MIL/uL — ABNORMAL LOW (ref 3.87–5.11)
WBC: 11.9 10*3/uL — ABNORMAL HIGH (ref 4.0–10.5)

## 2012-04-22 NOTE — Progress Notes (Signed)
Subjective: Postpartum Day #1: Cesarean Delivery Patient reports incisional pain and tolerating PO.    Objective: Vital signs in last 24 hours: Temp:  [98.1 F (36.7 C)-99.4 F (37.4 C)] 98.1 F (36.7 C) (03/06 0605) Pulse Rate:  [72-103] 76 (03/06 0605) Resp:  [12-20] 20 (03/06 0605) BP: (93-131)/(35-81) 106/71 mmHg (03/06 0605) SpO2:  [95 %-100 %] 98 % (03/06 0605) Weight:  [120.203 kg (265 lb)] 120.203 kg (265 lb) (03/05 1100)  Physical Exam:  General: alert Lochia: appropriate Uterine Fundus: firm Incision: healing well   Recent Labs  04/22/12 0545  HGB 9.7*  HCT 30.4*    Assessment/Plan: Status post Cesarean section. Doing well postoperatively.  Continue current care, ambulate, work on breastfeeding.  MEISINGER,TODD D 04/22/2012, 8:12 AM

## 2012-04-23 MED ORDER — HYDROCORTISONE 1 % EX CREA
TOPICAL_CREAM | CUTANEOUS | Status: DC | PRN
  Administered 2012-04-23 (×2): via TOPICAL
  Filled 2012-04-23: qty 28

## 2012-04-23 NOTE — Progress Notes (Signed)
POD #2 LTCS Doing ok, sore Afeb, VSS Abd- soft, fundus firm, incision ok Continue routine care, ambulate

## 2012-04-23 NOTE — Clinical Social Work Psychosocial (Signed)
    Clinical Social Work Department BRIEF PSYCHOSOCIAL ASSESSMENT 04/23/2012  Patient:  Martha Mathews     Account Number:  [id number]     Admit date:  04/21/2012  Clinical Social Worker:  Melene Plan  Date/Time:  04/23/2012 01:06 PM  Referred by:  Physician  Date Referred:  04/23/2012 Referred for  Behavioral Health Issues   Other Referral:   Hx of depression & Bipolar   Interview type:  Patient Other interview type:    PSYCHOSOCIAL DATA Living Status:  PARENTS Admitted from facility:   Level of care:   Primary support name:  Martha Mathews Primary support relationship to patient:  PARENT Degree of support available:   Involved    CURRENT CONCERNS Current Concerns  Behavioral Health Issues   Other Concerns:    SOCIAL WORK ASSESSMENT / PLAN CSW referral received to assess pt's history bipolar disorder & depression.  Pt acknowledges the diagnoses & told CSW that she planned to restart the Abilify during hospitalization.  Pt stated " "I stayed to myself during the because my attitude was off the charts," when asked how she coped without medication.  Pt's mother was present & agrees that pt needs to restart medication.  Pt's 27 year old son has also been diagnosed with bipolar disorder & depression.  Pt experience PP depression in 2006.  Her symptoms were treated with Zoloft.  CSW discussed signs/symptoms of PP depression & encouraged her to seek medical attention if symptoms arise.  Pt denies any SI history.  She has all the necessary supplies for the infant.  FOB, Martha Mathews is not involved, as per the pt. CSW available to assist further if needed.   Assessment/plan status:  No Further Intervention Required Other assessment/ plan:   Information/referral to community resources:   PP depression literature provided.    PATIENT'S/FAMILY'S RESPONSE TO PLAN OF CARE: Pt thanked CSW for resources.

## 2012-04-24 MED ORDER — HYDROMORPHONE HCL 2 MG PO TABS: 2.0000 mg | ORAL_TABLET | ORAL | Status: DC | PRN

## 2012-04-24 MED ORDER — IBUPROFEN 600 MG PO TABS: 600.0000 mg | ORAL_TABLET | Freq: Four times a day (QID) | ORAL | Status: DC | PRN

## 2012-04-24 MED ORDER — ALUM & MAG HYDROXIDE-SIMETH 200-200-20 MG/5ML PO SUSP
30.0000 mL | ORAL | Status: DC | PRN
  Administered 2012-04-24: 30 mL via ORAL
  Filled 2012-04-24: qty 30

## 2012-04-24 MED ORDER — ESCITALOPRAM OXALATE 20 MG PO TABS: 20.0000 mg | ORAL_TABLET | Freq: Every day | ORAL | Status: DC

## 2012-04-24 MED ORDER — ARIPIPRAZOLE 5 MG PO TABS: 5.0000 mg | ORAL_TABLET | Freq: Every day | ORAL | Status: DC

## 2012-04-24 NOTE — Progress Notes (Signed)
Patient complaining of chest discomfort, and states "It hurts whenever I take deep breaths". Patient complained of gas pain earlier in the night, and was encouraged to walk and given a simethicone tablet to help relieve gas. Ausculted hearts- no adventious sounds noted. Set of vitals signs taken : T 98.3 BP: 146 87 HR:80 O2:100% on room air. Dr. Jackelyn Knife called and notified of assessment findings. Order given for Maalox 30ml Q4H PRN for indigestion and chest discomfort. Will continue to monitor.

## 2012-04-24 NOTE — Progress Notes (Signed)
Patient feels she had a panic attack last night dealing with the father of the baby and his family, consult initiated to [employer].

## 2012-04-24 NOTE — Progress Notes (Signed)
CSW re-consulted this morning.  CSW met with MOB in her first floor room.  MGM was present and MOB said she could stay.  MOB had questions about FOB's rights.  She states he is not on the birth certificate.  CSW explained that he has no rights at this time, but could hire a Clinical research associate to request paternity testing and addition to the birth certificate.  MOB asked how to keep FOB away.  CSW explained that if he has been threatening or she feels he is a danger to her or baby, she can request to file a restraining order with the courts.  She can also petition for full custody.  CSW informed her that FOB will be added to the birth certificate if she files for Child Support and he is found to indeed be the biological father.  MOB states she does not plan to file for Child Support.  She thanked CSW and states no further questions or needs at this time.

## 2012-04-24 NOTE — Progress Notes (Signed)
POD #3 Doing ok, feels better, wants to get back on Lexapro and Abilify for bipolar Afeb, VSS Abd- soft, fundus firm, incision ok D/c home

## 2012-04-24 NOTE — Discharge Summary (Signed)
Obstetric Discharge Summary Reason for Admission: cesarean section Prenatal Procedures: none Intrapartum Procedures: cesarean: low cervical, transverse and tubal ligation Postpartum Procedures: none Complications-Operative and Postpartum: none Hemoglobin  Date Value Range Status  04/22/2012 9.7* 12.0 - 15.0 g/dL Final     HCT  Date Value Range Status  04/22/2012 30.4* 36.0 - 46.0 % Final    Physical Exam:  General: alert Lochia: appropriate Uterine Fundus: firm Incision: healing well  Discharge Diagnoses: Term Pregnancy-delivered  Discharge Information: Date: 04/24/2012 Activity: pelvic rest and no strenuous activity Diet: routine Medications: Ibuprofen and Dilaudid, Lexapro and Abilify Condition: stable Instructions: refer to practice specific booklet Discharge to: home Follow-up Information   Follow up with MEISINGER,TODD D, MD. Schedule an appointment as soon as possible for a visit in 2 weeks.   Contact information:   [address], [address] [place] [address] [phone]       Newborn Data: Live born female  Birth Weight: 7 lb 8.5 oz (3416 g) APGAR: 9, 9  Home with mother.  MEISINGER,TODD D 04/24/2012, 9:45 AM

## 2012-05-12 ENCOUNTER — Encounter (HOSPITAL_COMMUNITY): Payer: Self-pay | Admitting: *Deleted

## 2012-05-12 ENCOUNTER — Inpatient Hospital Stay (HOSPITAL_COMMUNITY)
Admission: AD | Admit: 2012-05-12 | Discharge: 2012-05-13 | Disposition: A | Discharge status: Home / Self Care | Payer: Medicaid Other | Source: Ambulatory Visit | Attending: Obstetrics and Gynecology | Admitting: Obstetrics and Gynecology

## 2012-05-12 DIAGNOSIS — O9853 Other viral diseases complicating the puerperium: Secondary | ICD-10-CM | POA: Insufficient documentation

## 2012-05-12 DIAGNOSIS — B009 Herpesviral infection, unspecified: Secondary | ICD-10-CM | POA: Insufficient documentation

## 2012-05-12 DIAGNOSIS — B001 Herpesviral vesicular dermatitis: Secondary | ICD-10-CM

## 2012-05-12 LAB — CBC
HCT: 36.3 % (ref 36.0–46.0)
Hemoglobin: 11.4 g/dL — ABNORMAL LOW (ref 12.0–15.0)
MCHC: 31.4 g/dL (ref 30.0–36.0)
MCV: 80.1 fL (ref 78.0–100.0)
RDW: 14.4 % (ref 11.5–15.5)
WBC: 11.4 10*3/uL — ABNORMAL HIGH (ref 4.0–10.5)

## 2012-05-12 NOTE — MAU Provider Note (Signed)
History     CSN: [id number]  Arrival date and time: 05/12/12 2318   None     Chief Complaint  Patient presents with  . Chills  . Neck Pain   HPI  Martha Mathews is a 27 y.o. [id number] who had her baby here about 3 weeks ago via c-section. She states that today she started to notice a cold sore on her lip, and that her neck was swollen and painful and she was feeling "small balls rolling around her in neck." She thinks that they may be lymph nodes. She denies any other pain. She states her bleeding is normal and has almost. She states that she sent the baby to be with her father. She has been using blistex on the cold sore. She has not had a cold sore in over a year, and has no RX medications available to treat the cold sore.    Past Medical History  Diagnosis Date  . 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 Bipolar 1 disorder   . 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 Urinary tract infection   . Depression     not currently on meds, being treated- trying to find a med  . Abnormal Pap smear   . Chlamydia 2004  . Anemia     HISTORY W/2006 PREGNANCY  . Dyslexia   . Migraines     otc med prn w/pregnancy    Past Surgical History  Procedure Laterality Date  . Cesarean section    . Myringotomy    . Tonsillectomy    . Induced abortion      vaccum on oct 2009  . Cholecystectomy  2006  . Eye surgery      left eye surgery - lazy eye  . Cesarean section N/A 04/21/2012    Procedure: CESAREAN SECTION;  Surgeon: Lavina Hamman, MD;  Location: [facility];  Service: Obstetrics;  Laterality: N/A;  Repeat  . Bilateral salpingectomy Bilateral 04/21/2012    Procedure: BILATERAL SALPINGECTOMY;  Surgeon: Lavina Hamman, MD;  Location: [facility];  Service: Obstetrics;  Laterality: Bilateral;    Family History  Problem Relation Age of Onset  . Cancer Maternal Grandmother     Breast cancer  . Cancer Paternal Grandmother     Breast cancer  . Stroke Paternal Grandmother  52  . Diabetes Cousin   . Hypertension Cousin   . Other Neg Hx     History  Substance Use Topics  . Smoking status: Never Smoker   . Smokeless tobacco: Never Used  . Alcohol Use: Yes     Comment: socially but none with pregnancy    Allergies:  Allergies  Allergen Reactions  . Hydrocodone-Acetaminophen     REACTION: rash and itching    Prescriptions prior to admission  Medication Sig Dispense Refill  . ARIPiprazole (ABILIFY) 5 MG tablet Take 1 tablet (5 mg total) by mouth daily.  30 tablet  2  . diphenhydramine-acetaminophen (TYLENOL PM) 25-500 MG TABS Take 1 tablet by mouth at bedtime as needed (sleep and leg pain).      [name] escitalopram (LEXAPRO) 20 MG tablet Take 1 tablet (20 mg total) by mouth daily.  30 tablet  2  . HYDROmorphone (DILAUDID) 2 MG tablet Take 1-2 tablets (2-4 mg total) by mouth every 3 (three) hours as needed.  30 tablet  0  . ibuprofen (ADVIL,MOTRIN) 600 MG tablet Take 1 tablet (600 mg total)  by mouth every 6 (six) hours as needed.  30 tablet  1    Review of Systems  Constitutional: Positive for chills. Negative for fever.  HENT: Positive for congestion. Negative for sore throat.   Respiratory: Negative for cough, shortness of breath and wheezing.   Gastrointestinal: Positive for constipation. Negative for nausea, vomiting, abdominal pain and diarrhea.  Genitourinary: Negative for dysuria, urgency and frequency.  Neurological: Positive for dizziness and headaches.   Physical Exam   Blood pressure 124/86, temperature 98.7 F (37.1 C), temperature source Oral, resp. rate 18, height 5\' 6"  (1.676 m), weight 113.399 kg (250 lb), unknown if currently breastfeeding.  Physical Exam  Nursing note and vitals reviewed. Constitutional: She is oriented to person, place, and time. She appears well-developed and well-nourished. No distress.  HENT:  Head: Normocephalic and atraumatic.  Right Ear: External ear normal.  Left Ear: External ear normal.  Mouth/Throat:  Oropharynx is clear and moist.  Eyes: Right eye exhibits no discharge. Left eye exhibits no discharge.  Cardiovascular: Normal rate.   Respiratory: Effort normal.  GI: Soft. She exhibits no distension.  Lymphadenopathy:    She has cervical adenopathy.  Neurological: She is alert and oriented to person, place, and time.  Skin: Skin is warm and dry.  Cold sore visible on upper lip.   Psychiatric: She has a normal mood and affect.    MAU Course  Procedures  Results for orders placed during the hospital encounter of 05/12/12 (from the past 24 hour(s))  URINALYSIS, ROUTINE W REFLEX MICROSCOPIC     Status: Abnormal   Collection Time    05/12/12 11:25 PM      Result Value Range   Color, Urine YELLOW  YELLOW   APPearance CLEAR  CLEAR   Specific Gravity, Urine 1.020  1.005 - 1.030   pH 6.0  5.0 - 8.0   Glucose, UA NEGATIVE  NEGATIVE mg/dL   Hgb urine dipstick NEGATIVE  NEGATIVE   Bilirubin Urine NEGATIVE  NEGATIVE   Ketones, ur NEGATIVE  NEGATIVE mg/dL   Protein, ur NEGATIVE  NEGATIVE mg/dL   Urobilinogen, UA 0.2  0.0 - 1.0 mg/dL   Nitrite NEGATIVE  NEGATIVE   Leukocytes, UA TRACE (*) NEGATIVE  URINE MICROSCOPIC-ADD ON     Status: None   Collection Time    05/12/12 11:25 PM      Result Value Range   Squamous Epithelial / LPF RARE  RARE   WBC, UA 0-2  <3 WBC/hpf   RBC / HPF 0-2  <3 RBC/hpf   Urine-Other MUCOUS PRESENT    CBC     Status: Abnormal   Collection Time    05/12/12 11:35 PM      Result Value Range   WBC 11.4 (*) 4.0 - 10.5 K/uL   RBC 4.53  3.87 - 5.11 MIL/uL   Hemoglobin 11.4 (*) 12.0 - 15.0 g/dL   HCT 45.4  09.8 - 11.9 %   MCV 80.1  78.0 - 100.0 fL   MCH 25.2 (*) 26.0 - 34.0 pg   MCHC 31.4  30.0 - 36.0 g/dL   RDW 14.7  82.9 - 56.2 %   Platelets 370  150 - 400 K/uL   0008: Spoke with Dr. Ellyn Hack, likely viral in nature. Plan to give RX for cold sore and dc home.  FU with office if sx persist.  Assessment and Plan   1. Herpes labialis    RX acyclovir 5%  ointment, apply 5x per day  FU  with Dr. Ellyn Hack as needed  Tawnya Crook 05/13/2012, 12:13 AM

## 2012-05-12 NOTE — MAU Note (Signed)
Patient complains of a swollen neck with tenderness on the right side and chills that started today.

## 2012-05-13 DIAGNOSIS — B009 Herpesviral infection, unspecified: Secondary | ICD-10-CM

## 2012-05-13 LAB — URINALYSIS, ROUTINE W REFLEX MICROSCOPIC
Bilirubin Urine: NEGATIVE
Glucose, UA: NEGATIVE mg/dL
Hgb urine dipstick: NEGATIVE
Specific Gravity, Urine: 1.02 (ref 1.005–1.030)

## 2012-05-13 LAB — URINE MICROSCOPIC-ADD ON

## 2012-05-13 MED ORDER — ACYCLOVIR 5 % EX OINT: TOPICAL_OINTMENT | CUTANEOUS | Status: DC

## 2012-06-01 ENCOUNTER — Emergency Department (HOSPITAL_COMMUNITY)
Admission: EM | Admit: 2012-06-01 | Discharge: 2012-06-01 | Disposition: A | Discharge status: Home / Self Care | Payer: Medicaid Other | Attending: Emergency Medicine | Admitting: Emergency Medicine

## 2012-06-01 ENCOUNTER — Encounter (HOSPITAL_COMMUNITY): Payer: Self-pay | Admitting: Emergency Medicine

## 2012-06-01 DIAGNOSIS — Z8619 Personal history of other infectious and parasitic diseases: Secondary | ICD-10-CM | POA: Insufficient documentation

## 2012-06-01 DIAGNOSIS — R42 Dizziness and giddiness: Secondary | ICD-10-CM | POA: Insufficient documentation

## 2012-06-01 DIAGNOSIS — J029 Acute pharyngitis, unspecified: Secondary | ICD-10-CM | POA: Insufficient documentation

## 2012-06-01 DIAGNOSIS — R040 Epistaxis: Secondary | ICD-10-CM | POA: Insufficient documentation

## 2012-06-01 DIAGNOSIS — Z79899 Other long term (current) drug therapy: Secondary | ICD-10-CM | POA: Insufficient documentation

## 2012-06-01 DIAGNOSIS — H9209 Otalgia, unspecified ear: Secondary | ICD-10-CM | POA: Insufficient documentation

## 2012-06-01 DIAGNOSIS — F319 Bipolar disorder, unspecified: Secondary | ICD-10-CM | POA: Insufficient documentation

## 2012-06-01 DIAGNOSIS — Z862 Personal history of diseases of the blood and blood-forming organs and certain disorders involving the immune mechanism: Secondary | ICD-10-CM | POA: Insufficient documentation

## 2012-06-01 DIAGNOSIS — Z8659 Personal history of other mental and behavioral disorders: Secondary | ICD-10-CM | POA: Insufficient documentation

## 2012-06-01 DIAGNOSIS — G935 Compression of brain: Secondary | ICD-10-CM | POA: Insufficient documentation

## 2012-06-01 DIAGNOSIS — Z87440 Personal history of urinary (tract) infections: Secondary | ICD-10-CM | POA: Insufficient documentation

## 2012-06-01 DIAGNOSIS — R51 Headache: Secondary | ICD-10-CM | POA: Insufficient documentation

## 2012-06-01 MED ORDER — METOCLOPRAMIDE HCL 5 MG/ML IJ SOLN
10.0000 mg | Freq: Once | INTRAMUSCULAR | Status: AC
  Administered 2012-06-01: 10 mg via INTRAVENOUS
  Filled 2012-06-01: qty 2

## 2012-06-01 MED ORDER — DIPHENHYDRAMINE HCL 50 MG/ML IJ SOLN
25.0000 mg | Freq: Once | INTRAMUSCULAR | Status: AC
  Administered 2012-06-01: 25 mg via INTRAVENOUS
  Filled 2012-06-01: qty 1

## 2012-06-01 MED ORDER — SODIUM CHLORIDE 0.9 % IV BOLUS (SEPSIS)
500.0000 mL | Freq: Once | INTRAVENOUS | Status: AC
  Administered 2012-06-01: 500 mL via INTRAVENOUS

## 2012-06-01 NOTE — ED Notes (Signed)
Josh, PA at bedside for evaluation.  

## 2012-06-01 NOTE — ED Notes (Signed)
Patient states started having sore throat and L ear pain yesterday.  Patient states she started getting a "migraine headache" soon thereafter.  Patient advised that she took tylenol PM last night with no relief.

## 2012-06-01 NOTE — ED Provider Notes (Signed)
History     CSN: [id number]  Arrival date & time 06/01/12  0744   First MD Initiated Contact with Patient 06/01/12 [phone]      Chief Complaint  Patient presents with  . Sore Throat  . Otalgia    L ear  . Migraine    (Consider location/radiation/quality/duration/timing/severity/associated sxs/prior treatment) HPI Comments: Patient with history of migraines, stable Chiari malformation -- presents with complaint of headache that began approximately 2 AM this morning. Patient states that she awoke with a nosebleed and had gradual onset of headache. Headache is left parietal and occipital. This headache is typical for her previous headaches however is more severe. She also c/o being dizzy. Dizziness described as spinning. She has come to the emergency department and received a "migraine cocktail" when she has had headaches this bad in the past. This treatment has been effective. Patient also complains of 2 days of sore throat and left ear pain. No fever or neck pain. No cough or shortness of breath. Patient had been on Topamax in the past but discontinued this approximately one year ago due to pregnancy and has not restarted. She denies photophobia or phonophobia. Onset of symptoms acute. Course is gradually. Nothing makes symptoms better or worse.  The history is provided by the patient.    Past Medical History  Diagnosis Date  . 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 Bipolar 1 disorder   . 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 Urinary tract infection   . Depression     not currently on meds, being treated- trying to find a med  . Abnormal Pap smear   . Chlamydia 2004  . Anemia     HISTORY W/2006 PREGNANCY  . Dyslexia   . Migraines     otc med prn w/pregnancy    Past Surgical History  Procedure Laterality Date  . Cesarean section    . Myringotomy    . Tonsillectomy    . Induced abortion      vaccum on oct 2009  . Cholecystectomy  2006  . Eye  surgery      left eye surgery - lazy eye  . Cesarean section N/A 04/21/2012    Procedure: CESAREAN SECTION;  Surgeon: Lavina Hamman, MD;  Location: [facility];  Service: Obstetrics;  Laterality: N/A;  Repeat  . Bilateral salpingectomy Bilateral 04/21/2012    Procedure: BILATERAL SALPINGECTOMY;  Surgeon: Lavina Hamman, MD;  Location: [facility];  Service: Obstetrics;  Laterality: Bilateral;    Family History  Problem Relation Age of Onset  . Cancer Maternal Grandmother     Breast cancer  . Cancer Paternal Grandmother     Breast cancer  . Stroke Paternal Grandmother 44  . Diabetes Cousin   . Hypertension Cousin   . Other Neg Hx     History  Substance Use Topics  . Smoking status: Never Smoker   . Smokeless tobacco: Never Used  . Alcohol Use: No     Comment: socially but none with pregnancy    OB History   Grav Para Term Preterm Abortions TAB SAB Ect Mult Living   3 2 2  0 1 1    2       Review of Systems  Constitutional: Negative for fever.  HENT: Positive for ear pain and sore throat. Negative for congestion, rhinorrhea, neck pain, neck stiffness, dental problem and sinus pressure.   Eyes: Negative for photophobia, discharge, redness  and visual disturbance.  Respiratory: Negative for shortness of breath.   Cardiovascular: Negative for chest pain.  Gastrointestinal: Negative for nausea and vomiting.  Musculoskeletal: Negative for gait problem.  Skin: Negative for rash.  Neurological: Positive for dizziness and headaches. Negative for syncope, speech difficulty, weakness, light-headedness and numbness.  Psychiatric/Behavioral: Negative for confusion.    Allergies  Adhesive and Hydrocodone-acetaminophen  Home Medications   Current Outpatient Rx  Name  Route  Sig  Dispense  Refill  . ARIPiprazole (ABILIFY) 5 MG tablet   Oral   Take 1 tablet (5 mg total) by mouth daily.   30 tablet   2   . escitalopram (LEXAPRO) 20 MG tablet   Oral   Take 1 tablet (20 mg total) by mouth  daily.   30 tablet   2   . ibuprofen (ADVIL,MOTRIN) 600 MG tablet   Oral   Take 600 mg by mouth every 6 (six) hours as needed for pain.           BP 124/72  Pulse 94  Temp(Src) 98.5 F (36.9 C) (Oral)  Resp 18  Ht 5\' 6"  (1.676 m)  Wt 245 lb (111.131 kg)  BMI 39.56 kg/m2  SpO2 97%  LMP 05/31/2012  Breastfeeding? No  Physical Exam  Nursing note and vitals reviewed. Constitutional: She is oriented to person, place, and time. She appears well-developed and well-nourished.  HENT:  Head: Normocephalic and atraumatic.  Right Ear: Tympanic membrane, external ear and ear canal normal.  Left Ear: Tympanic membrane, external ear and ear canal normal.  Nose: Nose normal.  Mouth/Throat: Uvula is midline, oropharynx is clear and moist and mucous membranes are normal.  Eyes: Conjunctivae, EOM and lids are normal. Pupils are equal, round, and reactive to light. Right eye exhibits no nystagmus. Left eye exhibits no nystagmus.  Neck: Normal range of motion. Neck supple.  Cardiovascular: Normal rate and regular rhythm.   Pulmonary/Chest: Effort normal and breath sounds normal.  Abdominal: Soft. There is no tenderness.  Musculoskeletal:       Cervical back: She exhibits normal range of motion, no tenderness and no bony tenderness.  Neurological: She is alert and oriented to person, place, and time. She has normal strength and normal reflexes. No cranial nerve deficit or sensory deficit. She displays a negative Romberg sign. Coordination and gait normal. GCS eye subscore is 4. GCS verbal subscore is 5. GCS motor subscore is 6.  Skin: Skin is warm and dry.  Psychiatric: She has a normal mood and affect.    ED Course  Procedures (including critical care time)  Labs Reviewed - No data to display No results found.   1. Headache     8:33 AM Patient seen and examined. Work-up initiated. Medications ordered.   Vital signs reviewed and are as follows: Filed Vitals:   06/01/12 0758  BP:  124/72  Pulse: 94  Temp: 98.5 F (36.9 C)  Resp: 18   10:55 AM patient improved. Pain is currently 2/10. Patient has ambulated in hallway without difficulty. Patient states that she is ready for discharge.    MDM  Headache, consistent with patient's typical headache but severe this morning. Patient has normal neurological exam. I do not suspect meningitis or infectious cause. No thunderclap onset. Do not suspect SAH. She is improved in emergency department.       Renne Crigler, PA-C 06/01/12 1057

## 2012-06-02 NOTE — ED Provider Notes (Signed)
Medical screening examination/treatment/procedure(s) were performed by non-physician practitioner and as supervising physician I was immediately available for consultation/collaboration.  Tobin Chad, MD 06/02/12 [phone]

## 2012-08-28 ENCOUNTER — Emergency Department (HOSPITAL_COMMUNITY)
Admission: EM | Admit: 2012-08-28 | Discharge: 2012-08-28 | Disposition: A | Discharge status: Home / Self Care | Payer: Medicaid Other | Attending: Emergency Medicine | Admitting: Emergency Medicine

## 2012-08-28 ENCOUNTER — Emergency Department (HOSPITAL_COMMUNITY): Payer: Medicaid Other

## 2012-08-28 ENCOUNTER — Encounter (HOSPITAL_COMMUNITY): Payer: Self-pay | Admitting: Emergency Medicine

## 2012-08-28 DIAGNOSIS — Y939 Activity, unspecified: Secondary | ICD-10-CM | POA: Insufficient documentation

## 2012-08-28 DIAGNOSIS — Z8659 Personal history of other mental and behavioral disorders: Secondary | ICD-10-CM | POA: Insufficient documentation

## 2012-08-28 DIAGNOSIS — W19XXXA Unspecified fall, initial encounter: Secondary | ICD-10-CM

## 2012-08-28 DIAGNOSIS — Z8619 Personal history of other infectious and parasitic diseases: Secondary | ICD-10-CM | POA: Insufficient documentation

## 2012-08-28 DIAGNOSIS — M549 Dorsalgia, unspecified: Secondary | ICD-10-CM

## 2012-08-28 DIAGNOSIS — Z87440 Personal history of urinary (tract) infections: Secondary | ICD-10-CM | POA: Insufficient documentation

## 2012-08-28 DIAGNOSIS — IMO0002 Reserved for concepts with insufficient information to code with codable children: Secondary | ICD-10-CM | POA: Insufficient documentation

## 2012-08-28 DIAGNOSIS — Y929 Unspecified place or not applicable: Secondary | ICD-10-CM | POA: Insufficient documentation

## 2012-08-28 DIAGNOSIS — X503XXA Overexertion from repetitive movements, initial encounter: Secondary | ICD-10-CM | POA: Insufficient documentation

## 2012-08-28 DIAGNOSIS — Q054 Unspecified spina bifida with hydrocephalus: Secondary | ICD-10-CM | POA: Insufficient documentation

## 2012-08-28 DIAGNOSIS — S93402A Sprain of unspecified ligament of left ankle, initial encounter: Secondary | ICD-10-CM

## 2012-08-28 DIAGNOSIS — S93409A Sprain of unspecified ligament of unspecified ankle, initial encounter: Secondary | ICD-10-CM | POA: Insufficient documentation

## 2012-08-28 DIAGNOSIS — Z8679 Personal history of other diseases of the circulatory system: Secondary | ICD-10-CM | POA: Insufficient documentation

## 2012-08-28 DIAGNOSIS — Z862 Personal history of diseases of the blood and blood-forming organs and certain disorders involving the immune mechanism: Secondary | ICD-10-CM | POA: Insufficient documentation

## 2012-08-28 MED ORDER — TRAMADOL HCL 50 MG PO TABS: 50.0000 mg | ORAL_TABLET | Freq: Four times a day (QID) | ORAL | Status: DC | PRN

## 2012-08-28 MED ORDER — TRAMADOL HCL 50 MG PO TABS
50.0000 mg | ORAL_TABLET | Freq: Once | ORAL | Status: AC
  Administered 2012-08-28: 50 mg via ORAL
  Filled 2012-08-28: qty 1

## 2012-08-28 NOTE — ED Notes (Signed)
Patient stepped into a hole today and twisted left ankle, swelling present but no deformity.  Also c/o left lower back pain.

## 2012-08-28 NOTE — ED Provider Notes (Signed)
Medical screening examination/treatment/procedure(s) were performed by non-physician practitioner and as supervising physician I was immediately available for consultation/collaboration.   Benny Lennert, MD 08/28/12 [phone]

## 2012-08-28 NOTE — ED Provider Notes (Signed)
History    This chart was scribed for non-physician practitioner Magnus Sinning, PA working with Benny Lennert, MD by [name], ED Scribe. This patient was seen in room WTR5/WTR5 and the patient's care was started at 4:39 PM .   CSN: [id number]  Arrival date & time 08/28/12  1612    No chief complaint on file.   The history is provided by the patient. No language interpreter was used.     HPI Comments: Martha Mathews is a 27 y.o. female who presents to the Emergency Department complaining of a fall that occurred several hours ago, with subsequent pain to the lower back and the left ankle.  Pt reports that she she stepped into a hole and twisted her ankle and fell onto her left side.  She denies head impact or LOC.   Back pain is localized to the general lower back and left sacrum and is constant, moderate and exacerbated by touching the area.  Ankle pain is localized over the left lateral malleolus and is exacerbated by bearing weight and moving the foot.  Pt has not been walking secondary to pain.  She has not attempted to treat pain pta.  She admits to tingling in the big toe of her left foot but denies weakness, numbness or tingling in any other area.  She denies urinary or bowel incontinence.  Pt notes that she has h/o multiple sprains to both ankles and states she has been told she has "weak ankles."  She also admits to h/o lumbar fracture as a child.      Past Medical History  Diagnosis Date  . 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 Bipolar 1 disorder   . 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 Urinary tract infection   . Depression     not currently on meds, being treated- trying to find a med  . Abnormal Pap smear   . Chlamydia 2004  . Anemia     HISTORY W/2006 PREGNANCY  . Dyslexia   . Migraines     otc med prn w/pregnancy    Past Surgical History  Procedure Laterality Date  . Cesarean section    . Myringotomy    .  Tonsillectomy    . Induced abortion      vaccum on oct 2009  . Cholecystectomy  2006  . Eye surgery      left eye surgery - lazy eye  . Cesarean section N/A 04/21/2012    Procedure: CESAREAN SECTION;  Surgeon: Lavina Hamman, MD;  Location: [facility];  Service: Obstetrics;  Laterality: N/A;  Repeat  . Bilateral salpingectomy Bilateral 04/21/2012    Procedure: BILATERAL SALPINGECTOMY;  Surgeon: Lavina Hamman, MD;  Location: [facility];  Service: Obstetrics;  Laterality: Bilateral;    Family History  Problem Relation Age of Onset  . Cancer Maternal Grandmother     Breast cancer  . Cancer Paternal Grandmother     Breast cancer  . Stroke Paternal Grandmother 53  . Diabetes Cousin   . Hypertension Cousin   . Other Neg Hx     History  Substance Use Topics  . Smoking status: Never Smoker   . Smokeless tobacco: Never Used  . Alcohol Use: No     Comment: socially but none with pregnancy    OB History   Grav Para Term Preterm Abortions TAB SAB Ect Mult Living   3 2 2  0 1 1    2        Review of Systems  Musculoskeletal: Positive for back pain.       Left ankle pain.  Neurological: Negative for weakness and numbness.       Tingling in left big toe  All other systems reviewed and are negative.      Allergies  Hydrocodone-acetaminophen and Adhesive  Home Medications   Current Outpatient Rx  Name  Route  Sig  Dispense  Refill  . docusate sodium (COLACE) 100 MG capsule   Oral   Take 200 mg by mouth daily as needed for constipation.          BP 117/78  Pulse 84  Temp(Src) 98.4 F (36.9 C)  Resp 20  Wt 246 lb (111.585 kg)  BMI 39.72 kg/m2  SpO2 98%  LMP 08/21/2012  Breastfeeding? No  Physical Exam  Nursing note and vitals reviewed. Constitutional: She appears well-developed and well-nourished. No distress.  HENT:  Head: Normocephalic and atraumatic.  Eyes: Conjunctivae are normal.  Neck: Normal range of motion. Neck supple.  Cardiovascular: Normal rate, regular  rhythm, normal heart sounds and intact distal pulses.   No murmur heard. 2+ dorsalis pedis pulse  Pulmonary/Chest: Effort normal and breath sounds normal. No respiratory distress. She has no wheezes. She has no rales.  Musculoskeletal: She exhibits tenderness.       Left ankle: She exhibits decreased range of motion and swelling. She exhibits no deformity. Tenderness. Lateral malleolus tenderness found.       Cervical back: She exhibits normal range of motion, no tenderness, no bony tenderness, no swelling, no edema and no deformity.       Thoracic back: She exhibits normal range of motion, no tenderness, no bony tenderness, no swelling, no edema and no deformity.       Lumbar back: She exhibits bony tenderness. She exhibits normal range of motion, no swelling, no edema and no deformity.  Bony tenderness over lumbar spine. Tenderness to palpation over left sacrum. Mild tenderness to palpation and swelling over lateral malleolus. Decreased ROM of left ankle secondary to pain.  Neurological: She is alert. No sensory deficit.  Skin: Skin is warm and dry.  Psychiatric: She has a normal mood and affect. Her behavior is normal.    ED Course  Procedures (including critical care time)  DIAGNOSTIC STUDIES: Oxygen Saturation is 98% on room air, normal by my interpretation.    COORDINATION OF CARE: 4:45 PM-Discussed treatment plan which includes pain medication and imaging with pt at bedside and pt agreed to plan.    5:57 PM: Informed pt that imaging ruled out fracture.  Discussed treatment plan including ASO ankle brace, crutches and pain medication with pt at bedside and pt agreed to plan.    Labs Reviewed - No data to display  Dg Lumbar Spine Complete  08/28/2012   *RADIOLOGY REPORT*  Clinical Data: Status post fall.  Twisting injury.  Low back and coccygeal pain.  LUMBAR SPINE - COMPLETE 4+ VIEW  Comparison: MRI lumbar spine 07/06/2008.  Findings: Vertebral body height and alignment are  normal. Intervertebral disc space height is maintained.  No pars interarticularis defect is identified.  Paraspinous structures demonstrate cholecystectomy clips.  IMPRESSION: Negative exam.  Please note the coccyx is not imaged on this study.   Original Report Authenticated By: Holley Dexter, M.D.   Dg Sacrum/coccyx  08/28/2012   *RADIOLOGY REPORT*  Clinical Data: Fall.  Sacrococcygeal pain.  SACRUM AND COCCYX -  2+ VIEW  Comparison:  None.  Findings:  There is no evidence of fracture or other focal bone lesions.  IMPRESSION: Negative.   Original Report Authenticated By: Myles Rosenthal, M.D.   Dg Ankle Complete Left  08/28/2012   *RADIOLOGY REPORT*  Clinical Data: Fall with twisting injury.  Lateral pain.  LEFT ANKLE COMPLETE - 3+ VIEW  Comparison: 06/25/2011.  Findings: .  Soft tissue swelling without underlying fracture or dislocation.  Well corticated ossific structure lateral malleolar region unchanged.  IMPRESSION: No fracture or dislocation.  Please see above.   Original Report Authenticated By: Lacy Duverney, M.D.    No diagnosis found.   MDM  Patient presents with lower back pain, pain over the coccyx, and pain of the left ankle that has been present since falling earlier today.  Xrays negative.  Patient is neurovascularly intact.  Patient denies hitting her head or LOC.  Patient given Ankle ASO and crutches.  Patient stable for discharge.  Return precautions given.  I personally performed the services described in this documentation, which was scribed in my presence. The recorded information has been reviewed and is accurate.    Pascal Lux [place]ob[place]o, PA-C 08/28/12 1851

## 2012-09-14 ENCOUNTER — Encounter: Payer: Self-pay | Admitting: Internal Medicine

## 2012-09-14 ENCOUNTER — Telehealth: Payer: Self-pay | Admitting: *Deleted

## 2012-09-14 ENCOUNTER — Ambulatory Visit (INDEPENDENT_AMBULATORY_CARE_PROVIDER_SITE_OTHER): Payer: Medicaid Other | Admitting: Internal Medicine

## 2012-09-14 VITALS — BP 126/80 | HR 85 | Temp 97.6°F | Ht 65.0 in | Wt 260.4 lb

## 2012-09-14 DIAGNOSIS — Z3490 Encounter for supervision of normal pregnancy, unspecified, unspecified trimester: Secondary | ICD-10-CM

## 2012-09-14 DIAGNOSIS — Z331 Pregnant state, incidental: Secondary | ICD-10-CM

## 2012-09-14 DIAGNOSIS — Z0000 Encounter for general adult medical examination without abnormal findings: Secondary | ICD-10-CM

## 2012-09-14 DIAGNOSIS — R42 Dizziness and giddiness: Secondary | ICD-10-CM

## 2012-09-14 DIAGNOSIS — R51 Headache: Secondary | ICD-10-CM

## 2012-09-14 DIAGNOSIS — F330 Major depressive disorder, recurrent, mild: Secondary | ICD-10-CM

## 2012-09-14 LAB — BASIC METABOLIC PANEL
CO2: 29 mEq/L (ref 19–32)
Chloride: 105 mEq/L (ref 96–112)
Potassium: 4.2 mEq/L (ref 3.5–5.3)
Sodium: 137 mEq/L (ref 135–145)

## 2012-09-14 LAB — CBC
Platelets: 369 10*3/uL (ref 150–400)
RBC: 4.73 MIL/uL (ref 3.87–5.11)
RDW: 14.6 % (ref 11.5–15.5)
WBC: 10.5 10*3/uL (ref 4.0–10.5)

## 2012-09-14 MED ORDER — MECLIZINE HCL 12.5 MG PO TABS: 12.5000 mg | ORAL_TABLET | Freq: Two times a day (BID) | ORAL | Status: DC | PRN

## 2012-09-14 MED ORDER — ESCITALOPRAM OXALATE 20 MG PO TABS: 10.0000 mg | ORAL_TABLET | Freq: Every day | ORAL | Status: DC

## 2012-09-14 NOTE — Progress Notes (Signed)
Subjective:   Patient ID: Martha Mathews female   DOB: 1986/01/04 27 y.o.   MRN: [id number]  HPI: Martha Mathews is a 27 y.o. obese white female G2P2 with PMH of depression, chiari 1 malformation, chronic migraines, and bipolar 1 disorder who presents to clinic today for an acute visit along side her father.  She is complaining of intermittent severe dizziness since Sunday along with continued headaches.  She is s/p recent c-section for her daughter on 04/21/12.  Since her daughter's birth she has been at home and all her medications were stopped and have not been restarted.  She says this is her first full doctors appointment since her daughter was born.  Previously, she has seen Dr. Terrace Arabia from neurology for her headaches and was noted to be on topamax and Maxalt for headaches and was also on abilify and lexapro for depression.  She says her lexapro was started post birth of her daughter but then she has not been back to the obgyn and has run out of her medications since May so she has not been taking anything.  She was given tramadol in the ED recently for an ankle sprain which she says helped with her headaches a little bit.    The dizziness is not getting any better or worse.  It is mainly positional and noticed most when she lays down or stands up and at times when she is playing with her children and makes a sudden movement.  She feels like the room is spinning and the vertigo lasts anywhere from 20 seconds to 2 minutes.  She denies any vomiting but does have some nausea at times and occasionally palpitations but denies any chest pain, shortness of breath, fever, chills, or abdominal pain.  She has no urinary complaints at this time either.  The dizziness is not always associated with her headaches.  She has not had trouble with frequent dizziness like this before.  She denies tinnitus, vision change, syncope or falls but did feel like she was going to fall yesterday.    We discussed the need for  her to return to Dr. Terrace Arabia for headache follow up and she said she will call his office and they will determine the most appopriate medications for her hedaches as she says topamax was not working very well.    In regards to her child, she is NOT breastfeeding and says she wasn't because she needed to be back on her medication, mainly Lexapro.  She says she is no longer lactating and her menstrual cycle started in April.  She has had tubal ligation after this birth and is not currently sexually active.  She would like to resume Lexapro at this time as she says she can tell her mood is different and she cries often. She denies any suicidal or homicidal ideation.  She was also apparently on abilify in the past as well.  She has been advised not to take lexapro and other medications if she is breast feeding and she repeated again that is NOT breastfeeding and DOES NOT intend to at this time.   She has gained significant amount of weight since her daughter was born and reports not drinking a lot of water and instead drinks lots of soda's and kool-aid and eats high fat food and large portions because she says she does not feel full and keeps eating. She is also not exercising much. Her weight today is 260lbs and this is up from 246 on  08/28/12.  I recommended weight loss and stopping all high sugar soft drinks and kool-aid and to drink water.  Her father and she says she barely drinks any water at all.  I also advised exercise as much as tolerated, at least 30 minutes a day, and decreasing portion size and less high carb food.  She said she did not realize she had gained that much weight and will try to work on it.   Past Medical History  Diagnosis Date  . 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 Bipolar 1 disorder   . 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 Urinary tract infection   . Depression     not currently on meds, being treated- trying to find a med  . Abnormal Pap smear   .  Chlamydia 2004  . Anemia     HISTORY W/2006 PREGNANCY  . Dyslexia   . Migraines     otc med prn w/pregnancy   Current Outpatient Prescriptions  Medication Sig Dispense Refill  . docusate sodium (COLACE) 100 MG capsule Take 200 mg by mouth daily as needed for constipation.      [name] escitalopram (LEXAPRO) 20 MG tablet Take 0.5 tablets (10 mg total) by mouth daily. Then increase to 20mg  daily after 1 week  30 tablet  2  . meclizine (ANTIVERT) 12.5 MG tablet Take 1 tablet (12.5 mg total) by mouth 2 (two) times daily as needed for dizziness or nausea.  30 tablet  0   No current facility-administered medications for this visit.   Family History  Problem Relation Age of Onset  . Cancer Maternal Grandmother     Breast cancer  . Cancer Paternal Grandmother     Breast cancer  . Stroke Paternal Grandmother 30  . Diabetes Cousin   . Hypertension Cousin   . Other Neg Hx    History   Social History  . Marital Status: Single    Spouse Name: N/A    Number of Children: N/A  . Years of Education: N/A   Social History Main Topics  . Smoking status: Never Smoker   . Smokeless tobacco: Never Used  . Alcohol Use: No     Comment: socially but none with pregnancy  . Drug Use: No  . Sexually Active: No     Comment: pregnant   Other Topics Concern  . None   Social History Narrative   Regular exercise- yes   Review of Systems:  Constitutional:  Denies fever, chills, diaphoresis, appetite change and fatigue.   HEENT:  Denies congestion, sore throat, rhinorrhea, sneezing  Respiratory:  Denies SOB, DOE, cough, and wheezing.   Cardiovascular:  Palpitations.  Denies chest pain and leg swelling.   Gastrointestinal:  Nausea and obesity.  Denies vomiting, abdominal pain, diarrhea, constipation, blood in stool and abdominal distention.   Genitourinary:  Denies dysuria, urgency, frequency, hematuria, flank pain and difficulty urinating.   Musculoskeletal:  Denies myalgias, back pain, joint swelling,  arthralgias and gait problem.   Skin:  Denies pallor, rash and wound.   Neurological:  Dizziness, light-headedness, headaches.  Denies seizures, syncope, and numbness.     Objective:  Physical Exam: Filed Vitals:   09/14/12 1557  BP: 126/80  Pulse: 85  Temp: 97.6 F (36.4 C)  TempSrc: Oral  Height: 5\' 5"  (1.651 m)  Weight: 260 lb 6.4 oz (118.117 kg)  SpO2: 98%   Vitals reviewed. General: sitting in  chair, NAD HEENT: PERRL, EOMI, no scleral icterus Cardiac: RRR, no rubs, murmurs or gallops Pulm: clear to auscultation bilaterally, no wheezes, rales, or rhonchi Abd: soft, obese, nontender, nondistended, BS present Ext: warm and well perfused, no pedal edema, +2DP B/L Neuro: alert and oriented X3, cranial nerves II-XII grossly intact, strength and sensation to light touch equal in bilateral upper and lower extremities, +positional vertigo, negative romberg and no drift, steady gait, unsteadiness noted on heel to toe walk, no dysdiadochokinesia, no deficits noted on finger to nose testing.  Assessment & Plan:  Discussed with Dr. Dalphine Handing Vertigo--positional: vestibular rehab, meclizine prn, follow up neurology Depressions--restart lexapro with caution of breast feeding which she denies

## 2012-09-14 NOTE — Assessment & Plan Note (Signed)
S/p birth of daughter 04/21/12 via c-section and tubal ligation

## 2012-09-14 NOTE — Assessment & Plan Note (Signed)
papsmear on next visit with pcp

## 2012-09-14 NOTE — Telephone Encounter (Signed)
Pt called with c/o 2 days of feeling that the  room is spinning and headaches.  Pain to lower back of head, always in same spot. Constant. It does help to lay  Down, but room still spins. Denies nausea/vomiting,denies sensitivity to light.   She has tried IBU 800 mg without relief.   She had baby 5 months ago and was taken off all meds. Has not restarted any.   Will see today

## 2012-09-14 NOTE — Assessment & Plan Note (Signed)
Acute onset, since Sunday she claims.  Noted to be positional, worse when standing up form seated position or lying down. Episodes last anywhere from 20 seconds to 2 minutes.  Associated with nausea and occasionally with headaches that are constant.  Denies syncope, fever, chills, AMS, seizures, or falls or vision disturbances.  Did feel off balance and like she was going to fall yesterday.  Notable positional dizziness complaints on physical exam especially with head movements.  Denies tinnitus.  Likely BPPV vs. Migrainous vertigo or from chiari malformation.  Malformation could be worsening since recent pregnancy.  Loss to follow up with Dr. Terrace Arabia at [facility] neurology.  -f/u neurology, Dr. Terrace Arabia as soon as possible, she needs to call and make an appointment. Our office also put the order in the work que -meclizine prn.  She says she is NOT breast-feeding.   -vestibular rehab -caution to stand slowly and take next steps slowly along with sitting down slowly and taking her time with movements  -walk with support and supervision -advised if symptoms worsen, associated with syncope, chest pain, sob, abdominal pain, fever, or chills, or worsening headache to let us know or go to ED if severe

## 2012-09-14 NOTE — Patient Instructions (Signed)
Please follow up with Dr. Terrace Arabia as soon as possible and also follow up with vestibular rehab  Take your time standing up and lying down and taking time before taking your next steps to walk  Walk with support and supervision  You may try meclizine to see if it helps with your vertigo as prescribed but NOT TO BE TAKEN IF BREASTFEEDING  We will restart your lexapro today, start with 10mg  daily and then increase to 20mg  daily after 1 week, again, DO NOT TAKE IF BREAST FEEDING  If your symptoms get worse or do not improve, call your pcp and let her know, [phone].  If you notice shortness of breath, chest pain, nausea, vomiting, worsening dizziness, chest pain, abdominal pain, sweating, feeling like you will faint or you do faint then call us and if severe go to emergency room  Vertigo Vertigo means you feel like you are moving when you are not. Vertigo can make you feel like things around you are moving when they are not. This problem often goes away on its own.  HOME CARE   Follow your doctor's instructions.  Avoid driving.  Avoid using heavy machinery.  Avoid doing any activity that could be dangerous if you have a vertigo attack.  Tell your doctor if a medicine seems to cause your vertigo. GET HELP RIGHT AWAY IF:   Your medicines do not help or make you feel worse.  You have trouble talking or walking.  You feel weak or have trouble using your arms, hands, or legs.  You have bad headaches.  You keep feeling sick to your stomach (nauseous) or throwing up (vomiting).  Your vision changes.  A family member notices changes in your behavior.  Your problems get worse. MAKE SURE YOU:  Understand these instructions.  Will watch your condition.  Will get help right away if you are not doing well or get worse. Document Released: 11/13/2007 Document Revised: 04/28/2011 Document Reviewed: 08/22/2010 [facility] Patient Information 2014 [place], [place].

## 2012-09-14 NOTE — Assessment & Plan Note (Addendum)
Was on lexapro and abilify prior to pregnancy then stopped.  Apparently lexapro was restarted after pregnancy by obgyn but again lost to follow up since may 2014 and has not taken any since then but would like to restart.  IS NOT BREASTFEEDING.  She says she was already advised by her obgyn not to take the medicine if she was breast feeding so she decided not to breast feed her daughter and is no longer lactating either.  Denies suicidal or homicidal ideation but is having mood swings and cries at times.    -will restart lexapro, 10mg  first week then increase to 20mg  following week, old dose per epic seems to be 20mg  -will defer to pcp and or psychiatry (unclear who she follows with) to restart abilify and advised to follow up with them if depression and symptoms get worse or do not improve -again, cautioned against use with breast feeding, she says she understands and does not plan to and is not at this time

## 2012-09-14 NOTE — Assessment & Plan Note (Signed)
Possible worsening of chiari malformation.  Loss to follow up with Dr. Terrace Arabia from neurology.  Used to be on topamax and maxalt but stopped with pregnancy and not restarted. Claims topamax did not help much and does not recall ever taking maxalt.   -f/u Dr. Terrace Arabia as soon as possible--will defer to him for medication restart

## 2012-09-15 NOTE — Addendum Note (Signed)
Addended by: Baltazar Apo on: 09/15/2012 11:33 AM   Modules accepted: Orders

## 2012-09-15 NOTE — Addendum Note (Signed)
Addended by: Baltazar Apo on: 09/15/2012 03:44 PM   Modules accepted: Orders

## 2012-09-21 NOTE — Progress Notes (Signed)
Case discussed with Dr. Qureshi at the time of the visit.  We reviewed the resident's history and exam and pertinent patient test results.  I agree with the assessment, diagnosis, and plan of care documented in the resident's note. 

## 2012-09-24 ENCOUNTER — Encounter: Payer: Self-pay | Admitting: Internal Medicine

## 2012-09-24 ENCOUNTER — Encounter: Payer: Self-pay | Admitting: *Deleted

## 2012-10-01 ENCOUNTER — Encounter: Payer: Self-pay | Admitting: Internal Medicine

## 2012-10-04 ENCOUNTER — Ambulatory Visit: Payer: Medicaid Other | Attending: Internal Medicine | Admitting: Physical Therapy

## 2012-10-04 DIAGNOSIS — IMO0001 Reserved for inherently not codable concepts without codable children: Secondary | ICD-10-CM | POA: Insufficient documentation

## 2012-10-04 DIAGNOSIS — R42 Dizziness and giddiness: Secondary | ICD-10-CM | POA: Insufficient documentation

## 2012-10-08 ENCOUNTER — Ambulatory Visit: Payer: Self-pay | Admitting: Nurse Practitioner

## 2012-10-15 ENCOUNTER — Encounter: Payer: Self-pay | Admitting: Internal Medicine

## 2012-10-25 NOTE — Addendum Note (Signed)
Addended by: Neomia Dear on: 10/25/2012 08:38 PM   Modules accepted: Orders

## 2012-12-12 ENCOUNTER — Emergency Department (HOSPITAL_COMMUNITY): Payer: Medicaid Other

## 2012-12-12 ENCOUNTER — Encounter (HOSPITAL_COMMUNITY): Payer: Self-pay | Admitting: Emergency Medicine

## 2012-12-12 ENCOUNTER — Emergency Department (HOSPITAL_COMMUNITY)
Admission: EM | Admit: 2012-12-12 | Discharge: 2012-12-13 | Disposition: A | Discharge status: Home / Self Care | Payer: Medicaid Other | Attending: Emergency Medicine | Admitting: Emergency Medicine

## 2012-12-12 DIAGNOSIS — R42 Dizziness and giddiness: Secondary | ICD-10-CM | POA: Insufficient documentation

## 2012-12-12 DIAGNOSIS — F3289 Other specified depressive episodes: Secondary | ICD-10-CM | POA: Insufficient documentation

## 2012-12-12 DIAGNOSIS — R51 Headache: Secondary | ICD-10-CM | POA: Insufficient documentation

## 2012-12-12 DIAGNOSIS — Q054 Unspecified spina bifida with hydrocephalus: Secondary | ICD-10-CM | POA: Insufficient documentation

## 2012-12-12 DIAGNOSIS — F319 Bipolar disorder, unspecified: Secondary | ICD-10-CM | POA: Insufficient documentation

## 2012-12-12 DIAGNOSIS — J3489 Other specified disorders of nose and nasal sinuses: Secondary | ICD-10-CM | POA: Insufficient documentation

## 2012-12-12 DIAGNOSIS — R071 Chest pain on breathing: Secondary | ICD-10-CM | POA: Insufficient documentation

## 2012-12-12 DIAGNOSIS — R5381 Other malaise: Secondary | ICD-10-CM | POA: Insufficient documentation

## 2012-12-12 DIAGNOSIS — Z79899 Other long term (current) drug therapy: Secondary | ICD-10-CM | POA: Insufficient documentation

## 2012-12-12 DIAGNOSIS — F329 Major depressive disorder, single episode, unspecified: Secondary | ICD-10-CM | POA: Insufficient documentation

## 2012-12-12 DIAGNOSIS — Z3202 Encounter for pregnancy test, result negative: Secondary | ICD-10-CM | POA: Insufficient documentation

## 2012-12-12 DIAGNOSIS — R0789 Other chest pain: Secondary | ICD-10-CM

## 2012-12-12 LAB — URINE MICROSCOPIC-ADD ON

## 2012-12-12 LAB — BASIC METABOLIC PANEL
BUN: 10 mg/dL (ref 6–23)
CO2: 26 mEq/L (ref 19–32)
Calcium: 8.9 mg/dL (ref 8.4–10.5)
Glucose, Bld: 97 mg/dL (ref 70–99)
Sodium: 135 mEq/L (ref 135–145)

## 2012-12-12 LAB — CBC WITH DIFFERENTIAL/PLATELET
Eosinophils Relative: 1 % (ref 0–5)
HCT: 36.2 % (ref 36.0–46.0)
Hemoglobin: 11.7 g/dL — ABNORMAL LOW (ref 12.0–15.0)
Lymphocytes Relative: 35 % (ref 12–46)
Lymphs Abs: 3.3 10*3/uL (ref 0.7–4.0)
MCV: 79 fL (ref 78.0–100.0)
Monocytes Absolute: 0.6 10*3/uL (ref 0.1–1.0)
Monocytes Relative: 6 % (ref 3–12)
Platelets: 318 10*3/uL (ref 150–400)
RBC: 4.58 MIL/uL (ref 3.87–5.11)
WBC: 9.4 10*3/uL (ref 4.0–10.5)

## 2012-12-12 LAB — URINALYSIS, ROUTINE W REFLEX MICROSCOPIC
Glucose, UA: NEGATIVE mg/dL
Protein, ur: NEGATIVE mg/dL
Specific Gravity, Urine: 1.028 (ref 1.005–1.030)

## 2012-12-12 LAB — POCT PREGNANCY, URINE: Preg Test, Ur: NEGATIVE

## 2012-12-12 MED ORDER — LORAZEPAM 2 MG/ML IJ SOLN
1.0000 mg | Freq: Once | INTRAMUSCULAR | Status: AC
  Administered 2012-12-12: 1 mg via INTRAVENOUS
  Filled 2012-12-12: qty 1

## 2012-12-12 MED ORDER — ONDANSETRON HCL 4 MG/2ML IJ SOLN
4.0000 mg | Freq: Once | INTRAMUSCULAR | Status: AC
  Administered 2012-12-12: 4 mg via INTRAVENOUS
  Filled 2012-12-12: qty 2

## 2012-12-12 MED ORDER — MECLIZINE HCL 25 MG PO TABS
50.0000 mg | ORAL_TABLET | Freq: Once | ORAL | Status: AC
  Administered 2012-12-12: 50 mg via ORAL
  Filled 2012-12-12: qty 2

## 2012-12-12 MED ORDER — SODIUM CHLORIDE 0.9 % IV BOLUS (SEPSIS)
1000.0000 mL | Freq: Once | INTRAVENOUS | Status: AC
  Administered 2012-12-12: 1000 mL via INTRAVENOUS

## 2012-12-12 MED ORDER — MORPHINE SULFATE 4 MG/ML IJ SOLN
4.0000 mg | Freq: Once | INTRAMUSCULAR | Status: AC
  Administered 2012-12-12: 4 mg via INTRAVENOUS
  Filled 2012-12-12: qty 1

## 2012-12-12 NOTE — ED Notes (Signed)
Pt states unable to urinate at this time. 

## 2012-12-12 NOTE — ED Provider Notes (Signed)
CSN: [id number]     Arrival date & time 12/12/12  1843 History   First MD Initiated Contact with Patient 12/12/12 2037     Chief Complaint  Patient presents with  . Headache   (Consider location/radiation/quality/duration/timing/severity/associated sxs/prior Treatment) HPI Patient with hx chiari malformation and vertigo p/w headache and dizziness that have been intermittent for the past several days.  Pain is in the left occiput and is sharp, dizziness is defined as feeling like she is falling forward and the room spinning when she lies flat.  The symptoms only last a few minutes at a time.  Headache is currently 7/10 intensity.  The pain comes and goes, resolves completely when it is gone. She has taken ibuprofen at 4pm today without improvement.  Today she got up from the couch and was off balance, fell backwards.  Denies LOC, head injury. Pt has a hx of chiari malformation which she has had followed until last year when she lost her medicaid.   Past Medical History  Diagnosis Date  . 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 Bipolar 1 disorder   . 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 Urinary tract infection   . Depression     not currently on meds, being treated- trying to find a med  . Abnormal Pap smear   . Chlamydia 2004  . Anemia     HISTORY W/2006 PREGNANCY  . Dyslexia   . Migraines     otc med prn w/pregnancy   Past Surgical History  Procedure Laterality Date  . Cesarean section    . Myringotomy    . Tonsillectomy    . Induced abortion      vaccum on oct 2009  . Cholecystectomy  2006  . Eye surgery      left eye surgery - lazy eye  . Cesarean section N/A 04/21/2012    Procedure: CESAREAN SECTION;  Surgeon: Lavina Hamman, MD;  Location: [facility];  Service: Obstetrics;  Laterality: N/A;  Repeat  . Bilateral salpingectomy Bilateral 04/21/2012    Procedure: BILATERAL SALPINGECTOMY;  Surgeon: Lavina Hamman, MD;  Location: [facility];  Service:  Obstetrics;  Laterality: Bilateral;   Family History  Problem Relation Age of Onset  . Cancer Maternal Grandmother     Breast cancer  . Cancer Paternal Grandmother     Breast cancer  . Stroke Paternal Grandmother 59  . Diabetes Cousin   . Hypertension Cousin   . Other Neg Hx    History  Substance Use Topics  . Smoking status: Never Smoker   . Smokeless tobacco: Never Used  . Alcohol Use: No     Comment: socially but none with pregnancy   OB History   Grav Para Term Preterm Abortions TAB SAB Ect Mult Living   3 2 2  0 1 1    2      Review of Systems  Constitutional: Negative for fever and chills.  HENT: Positive for congestion. Negative for ear pain, rhinorrhea, sinus pressure and sore throat.   Eyes: Negative for visual disturbance.  Respiratory: Negative for cough and shortness of breath.   Cardiovascular: Negative for chest pain.  Gastrointestinal: Negative for nausea, vomiting, abdominal pain and diarrhea.  Genitourinary: Negative for dysuria, urgency, frequency, vaginal bleeding and vaginal discharge.  Musculoskeletal: Negative for myalgias.  Neurological: Positive for dizziness and weakness (generalized). Negative for numbness.    Allergies  Hydrocodone-acetaminophen and Adhesive  Home  Medications   Current Outpatient Rx  Name  Route  Sig  Dispense  Refill  . ARIPiprazole (ABILIFY) 15 MG tablet   Oral   Take 15 mg by mouth daily.         [name] escitalopram (LEXAPRO) 20 MG tablet   Oral   Take 0.5 tablets (10 mg total) by mouth daily. Then increase to 20mg  daily after 1 week   30 tablet   2    BP 132/85  Pulse 75  Temp(Src) 98.5 F (36.9 C) (Oral)  Resp 16  SpO2 100%  LMP 12/11/2012  Breastfeeding? No Physical Exam  Nursing note and vitals reviewed. Constitutional: She appears well-developed and well-nourished. No distress.  HENT:  Head: Normocephalic and atraumatic.  Neck: Neck supple.  Cardiovascular: Normal rate and regular rhythm.    Pulmonary/Chest: Effort normal and breath sounds normal.  Neurological: She is alert.  CN II-XII intact, EOMs intact, no pronator drift, grip strengths equal bilaterally; strength 5/5 in all extremities, sensation intact in all extremities; finger to nose, heel to shin, rapid alternating movements normal     Skin: She is not diaphoretic.    ED Course  Procedures (including critical care time) Labs Review Labs Reviewed  CBC WITH DIFFERENTIAL - Abnormal; Notable for the following:    Hemoglobin 11.7 (*)    MCH 25.5 (*)    All other components within normal limits  URINALYSIS, ROUTINE W REFLEX MICROSCOPIC - Abnormal; Notable for the following:    APPearance CLOUDY (*)    Hgb urine dipstick LARGE (*)    All other components within normal limits  URINE MICROSCOPIC-ADD ON - Abnormal; Notable for the following:    Squamous Epithelial / LPF MANY (*)    Bacteria, UA FEW (*)    All other components within normal limits  URINE CULTURE  BASIC METABOLIC PANEL  POCT PREGNANCY, URINE   Imaging Review Ct Head Wo Contrast  12/12/2012   CLINICAL DATA:  Headache, nausea, dizziness.  EXAM: CT HEAD WITHOUT CONTRAST  TECHNIQUE: Contiguous axial images were obtained from the base of the skull through the vertex without intravenous contrast.  COMPARISON:  08/23/2007  FINDINGS: Cerebellar tonsils protrude below the foramina magnum. No hydrocephalus.  There is no evidence of acute intracranial hemorrhage, brain edema, mass lesion, acute infarction, mass effect, or midline shift. Acute infarct may be in apparent on noncontrast CT. No other intra-axial abnormalities are seen, and the ventricles and sulci are within normal limits in size and symmetry. No abnormal extra-axial fluid collections or masses are identified. No significant calvarial abnormality.  IMPRESSION: 1. Negative for bleed or other acute intracranial process. 2. Chiari malformation without hydrocephalus.   Electronically Signed   By: Oley Balm M.D.   On: 12/12/2012 21:58    EKG Interpretation   None      10:55 PM Pt reports her dizziness is better with meclizine.  She has been able to ambulate to and from the bathroom without symptoms and without difficulty.   11:28 PM I was just going to discuss patient's urinalysis when patient began complaining of chest pain.  States central chest pain, worse with breathing.  This began about 10 minutes after morphine was given.  Denies SOB. EKG ordered.   11:43 PM Pt assessed for her chest pain.  Pt denies any itching or swelling in her mouth or throat, and difficulty swallowing or breathing.  Notes pain in anterior central chest, worse with palpation and movement.  Reproducible with palpation.  Cardiac/pulm exam unremarkable except for chest wall tenderness.   12:50 AM Pt is reassessed again.  Pain is reproducible with palpation and movement.  She is not SOB.  NO risk factors for PE.  PERC negative.    Date: 12/13/2012  Rate: 83  Rhythm: normal sinus rhythm  QRS Axis: normal  Intervals: normal  ST/T Wave abnormalities: normal  Conduction Disutrbances: none  Narrative Interpretation:   Old EKG Reviewed: No significant changes noted      MDM   1. Headache   2. Dizziness   3. Chest wall pain     Pt with intermittent headache and dizziness x several days.  Pt has hx chiari malformation, also hx vertigo.  Dizziness improved with meclizine.  CT head negative with exception of known chiari malformation.  No red flags for headache. Gait is normal per pt and RN.  Headache resolved with medication.  Upon discharge, pt developed chest pain which appears to be chest wall pain, reproduced with palpation.  No SOB.  This was developed at rest.  Very atypical.  Doubt ACS. Low risk for PE, PERC negative.  Discussed result, findings, treatment, and follow up  with patient.  Pt given return precautions.  Pt verbalizes understanding and agrees with plan.          Trixie Dredge,  PA-C 12/13/12 0115  Trixie Dredge, PA-C 12/13/12 0121

## 2012-12-12 NOTE — ED Notes (Signed)
Pt states dx with vertigo one month ago. 2 days ago started with HA on left sided and increase in dizziness/ room spinning. C/o nausea and floaters. Pt states today lost balance and fell striking table denies injuries from fall. Pt alert x4 MAE

## 2012-12-12 NOTE — ED Notes (Signed)
Pt had sudden onset pain mid-chest. Pt put on cardiac monitor, in sinus rhythm, O2 100%. Pt reports difficulty breathing.

## 2012-12-13 MED ORDER — KETOROLAC TROMETHAMINE 30 MG/ML IJ SOLN
30.0000 mg | Freq: Once | INTRAMUSCULAR | Status: AC
  Administered 2012-12-13: 30 mg via INTRAVENOUS
  Filled 2012-12-13: qty 1

## 2012-12-13 MED ORDER — BUTALBITAL-APAP-CAFFEINE 50-325-40 MG PO TABS: 1.0000 | ORAL_TABLET | Freq: Four times a day (QID) | ORAL | Status: AC | PRN

## 2012-12-13 MED ORDER — MECLIZINE HCL 50 MG PO TABS
25.0000 mg | ORAL_TABLET | Freq: Three times a day (TID) | ORAL | Status: DC | PRN
Start: 2012-12-13 — End: 2014-06-23

## 2012-12-14 LAB — URINE CULTURE: Colony Count: 65000

## 2012-12-15 ENCOUNTER — Encounter: Payer: Self-pay | Admitting: Internal Medicine

## 2012-12-15 ENCOUNTER — Ambulatory Visit: Payer: Self-pay | Admitting: Internal Medicine

## 2012-12-15 NOTE — ED Provider Notes (Signed)
Medical screening examination/treatment/procedure(s) were performed by non-physician practitioner and as supervising physician I was immediately available for consultation/collaboration.   Emryn Flanery T Tyrese Ficek, MD 12/15/12 1332 

## 2013-12-18 ENCOUNTER — Emergency Department (HOSPITAL_COMMUNITY)
Admission: EM | Admit: 2013-12-18 | Discharge: 2013-12-18 | Disposition: A | Discharge status: Home / Self Care | Payer: Medicaid Other | Attending: Emergency Medicine | Admitting: Emergency Medicine

## 2013-12-18 ENCOUNTER — Encounter (HOSPITAL_COMMUNITY): Payer: Self-pay | Admitting: Family Medicine

## 2013-12-18 DIAGNOSIS — Z8742 Personal history of other diseases of the female genital tract: Secondary | ICD-10-CM | POA: Insufficient documentation

## 2013-12-18 DIAGNOSIS — Z79899 Other long term (current) drug therapy: Secondary | ICD-10-CM | POA: Diagnosis not present

## 2013-12-18 DIAGNOSIS — G501 Atypical facial pain: Secondary | ICD-10-CM | POA: Insufficient documentation

## 2013-12-18 DIAGNOSIS — F319 Bipolar disorder, unspecified: Secondary | ICD-10-CM | POA: Diagnosis not present

## 2013-12-18 DIAGNOSIS — Z862 Personal history of diseases of the blood and blood-forming organs and certain disorders involving the immune mechanism: Secondary | ICD-10-CM | POA: Insufficient documentation

## 2013-12-18 DIAGNOSIS — R1110 Vomiting, unspecified: Secondary | ICD-10-CM | POA: Insufficient documentation

## 2013-12-18 DIAGNOSIS — R197 Diarrhea, unspecified: Secondary | ICD-10-CM | POA: Diagnosis present

## 2013-12-18 DIAGNOSIS — Z8669 Personal history of other diseases of the nervous system and sense organs: Secondary | ICD-10-CM | POA: Insufficient documentation

## 2013-12-18 LAB — CBC WITH DIFFERENTIAL/PLATELET
BASOS ABS: 0 10*3/uL (ref 0.0–0.1)
BASOS PCT: 0 % (ref 0–1)
Eosinophils Absolute: 0.1 10*3/uL (ref 0.0–0.7)
Eosinophils Relative: 1 % (ref 0–5)
HCT: 37.8 % (ref 36.0–46.0)
HEMOGLOBIN: 12.2 g/dL (ref 12.0–15.0)
Lymphocytes Relative: 27 % (ref 12–46)
Lymphs Abs: 2 10*3/uL (ref 0.7–4.0)
MCH: 26.1 pg (ref 26.0–34.0)
MCHC: 32.3 g/dL (ref 30.0–36.0)
MCV: 80.9 fL (ref 78.0–100.0)
MONOS PCT: 6 % (ref 3–12)
Monocytes Absolute: 0.4 10*3/uL (ref 0.1–1.0)
NEUTROS ABS: 4.8 10*3/uL (ref 1.7–7.7)
Neutrophils Relative %: 66 % (ref 43–77)
PLATELETS: 274 10*3/uL (ref 150–400)
RBC: 4.67 MIL/uL (ref 3.87–5.11)
RDW: 14.1 % (ref 11.5–15.5)
WBC: 7.3 10*3/uL (ref 4.0–10.5)

## 2013-12-18 LAB — COMPREHENSIVE METABOLIC PANEL
ALBUMIN: 3.1 g/dL — AB (ref 3.5–5.2)
ALK PHOS: 56 U/L (ref 39–117)
ALT: 15 U/L (ref 0–35)
AST: 14 U/L (ref 0–37)
Anion gap: 9 (ref 5–15)
BUN: 9 mg/dL (ref 6–23)
CO2: 25 mEq/L (ref 19–32)
Calcium: 8.7 mg/dL (ref 8.4–10.5)
Chloride: 106 mEq/L (ref 96–112)
Creatinine, Ser: 0.63 mg/dL (ref 0.50–1.10)
GFR calc Af Amer: >90 mL/min (ref >90)
GFR calc non Af Amer: >90 mL/min (ref >90)
Glucose, Bld: 103 mg/dL — ABNORMAL HIGH (ref 70–99)
POTASSIUM: 4.3 meq/L (ref 3.7–5.3)
SODIUM: 140 meq/L (ref 137–147)
Total Bilirubin: <0.2 mg/dL — ABNORMAL LOW (ref 0.3–1.2)
Total Protein: 6.1 g/dL (ref 6.0–8.3)

## 2013-12-18 LAB — URINALYSIS, ROUTINE W REFLEX MICROSCOPIC
BILIRUBIN URINE: NEGATIVE
Glucose, UA: NEGATIVE mg/dL
HGB URINE DIPSTICK: NEGATIVE
Ketones, ur: NEGATIVE mg/dL
Leukocytes, UA: NEGATIVE
Nitrite: NEGATIVE
Protein, ur: NEGATIVE mg/dL
SPECIFIC GRAVITY, URINE: 1.021 (ref 1.005–1.030)
Urobilinogen, UA: 0.2 mg/dL (ref 0.0–1.0)
pH: 6 (ref 5.0–8.0)

## 2013-12-18 LAB — PREGNANCY, URINE: Preg Test, Ur: NEGATIVE

## 2013-12-18 MED ORDER — ONDANSETRON HCL 4 MG PO TABS: 4.0000 mg | ORAL_TABLET | Freq: Four times a day (QID) | ORAL | Status: DC

## 2013-12-18 MED ORDER — ONDANSETRON HCL 4 MG/2ML IJ SOLN
4.0000 mg | Freq: Once | INTRAMUSCULAR | Status: AC
  Administered 2013-12-18: 4 mg via INTRAVENOUS
  Filled 2013-12-18: qty 2

## 2013-12-18 MED ORDER — SODIUM CHLORIDE 0.9 % IV BOLUS (SEPSIS)
1000.0000 mL | Freq: Once | INTRAVENOUS | Status: AC
  Administered 2013-12-18: 1000 mL via INTRAVENOUS

## 2013-12-18 MED ORDER — ONDANSETRON 4 MG PO TBDP
8.0000 mg | ORAL_TABLET | Freq: Once | ORAL | Status: AC
  Administered 2013-12-18: 8 mg via ORAL
  Filled 2013-12-18: qty 2

## 2013-12-18 NOTE — ED Notes (Signed)
Per pt sts N,VD since yesterday. sts also some right face pain and neck pain.

## 2013-12-18 NOTE — ED Notes (Signed)
Pt drinking ginger ale with no problems

## 2013-12-18 NOTE — ED Provider Notes (Signed)
CSN: [id number]     Arrival date & time 12/18/13  2423 History   First MD Initiated Contact with Patient 12/18/13 1115     Chief Complaint  Patient presents with  . Diarrhea  . Facial Pain     (Consider location/radiation/quality/duration/timing/severity/associated sxs/prior Treatment) HPI  Pt presenting with c/o nausea, vomiting, diarrhea- she states the symptoms began yesterday.  She states that she has had approx 5 episodes of emesis- nonbloody and nonbilious.  No fever/chills.  She has had multiple episodes of watery diarrhea.  No blood or mucous in stool.  No abdominal pain.  Today she noted right sided sore throat and ear pain as well.  No cough or difficulty breathing.  No difficulty swallowing.  No recent sick contacts.  No recent travel.  She has been able to keep down some water and gatorade in small amounts.  There are no other associated systemic symptoms, there are no other alleviating or modifying factors.   Past Medical History  Diagnosis Date  . 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 Bipolar 1 disorder   . 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 Urinary tract infection   . Depression     not currently on meds, being treated- trying to find a med  . Abnormal Pap smear   . Chlamydia 2004  . Anemia     HISTORY W/2006 PREGNANCY  . Dyslexia   . Migraines     otc med prn w/pregnancy   Past Surgical History  Procedure Laterality Date  . Cesarean section    . Myringotomy    . Tonsillectomy    . Induced abortion      vaccum on oct 2009  . Cholecystectomy  2006  . Eye surgery      left eye surgery - lazy eye  . Cesarean section N/A 04/21/2012    Procedure: CESAREAN SECTION;  Surgeon: Cheri Fowler, MD;  Location: [facility];  Service: Obstetrics;  Laterality: N/A;  Repeat  . Bilateral salpingectomy Bilateral 04/21/2012    Procedure: BILATERAL SALPINGECTOMY;  Surgeon: Cheri Fowler, MD;  Location: [facility];  Service: Obstetrics;  Laterality:  Bilateral;   Family History  Problem Relation Age of Onset  . Cancer Maternal Grandmother     Breast cancer  . Cancer Paternal Grandmother     Breast cancer  . Stroke Paternal Grandmother 9  . Diabetes Cousin   . Hypertension Cousin   . Other Neg Hx    History  Substance Use Topics  . Smoking status: Never Smoker   . Smokeless tobacco: Never Used  . Alcohol Use: No     Comment: socially but none with pregnancy   OB History    Gravida Para Term Preterm AB TAB SAB Ectopic Multiple Living   3 2 2  0 1 1    2      Review of Systems  ROS reviewed and all otherwise negative except for mentioned in HPI    Allergies  Hydrocodone-acetaminophen and Adhesive  Home Medications   Prior to Admission medications   Medication Sig Start Date End Date Taking? Authorizing Provider  ARIPiprazole (ABILIFY) 15 MG tablet Take 15 mg by mouth daily.   Yes Historical Provider, MD  escitalopram (LEXAPRO) 20 MG tablet Take 0.5 tablets (10 mg total) by mouth daily. Then increase to 20mg  daily after 1 week 09/14/12  Yes Wilber Oliphant, MD  meclizine (ANTIVERT) 50 MG tablet Take 0.5-1  tablets (25-50 mg total) by mouth 3 (three) times daily as needed for dizziness. 12/13/12   Clayton Bibles, PA-C  ondansetron (ZOFRAN) 4 MG tablet Take 1 tablet (4 mg total) by mouth every 6 (six) hours. 12/18/13   Threasa Beards, MD   BP 104/67 mmHg  Pulse 63  Temp(Src) 98.6 F (37 C) (Oral)  Resp 10  SpO2 100%  LMP 11/21/2013  Vitals reviewed Physical Exam  Physical Examination: General appearance - alert, well appearing, and in no distress Mental status - alert, oriented to person, place, and time Eyes - no conjunctival injection, no scleral icterus Ears - bilateral TM's and external ear canals normal Mouth - mucous membranes moist, pharynx normal without lesions  Neck- no sig cervical LAD Chest - clear to auscultation, no wheezes, rales or rhonchi, symmetric air entry Heart - normal rate, regular rhythm,  normal S1, S2, no murmurs, rubs, clicks or gallops Abdomen - soft, nontender, nondistended, no masses or organomegaly, nabs Extremities - peripheral pulses normal, no pedal edema, no clubbing or cyanosis Skin - normal coloration and turgor, no rashes  ED Course  Procedures (including critical care time)  3:48 PM pt initially failed po challenge with vomiting after ODT zofran.  onrecheck after IV fluids and IV zofran she is feeling much improved.  Will attempt another po challenge now.  Labs Review Labs Reviewed  COMPREHENSIVE METABOLIC PANEL - Abnormal; Notable for the following:    Glucose, Bld 103 (*)    Albumin 3.1 (*)    Total Bilirubin <0.2 (*)    All other components within normal limits  URINALYSIS, ROUTINE W REFLEX MICROSCOPIC - Abnormal; Notable for the following:    APPearance CLOUDY (*)    All other components within normal limits  CBC WITH DIFFERENTIAL  PREGNANCY, URINE    Imaging Review No results found.   EKG Interpretation None      MDM   Final diagnoses:  Vomiting and diarrhea    Pt presenting with c/o nausea/vomiting/diarrhea with acute onset yesterday, she has benign abdominal exam, she failed po trial after ODT zofran- feels much improved after IV fluids and IV zofran and has been able to tolerate po fluids.  Suspect viral gastroenteritis, doubt appendicitis, biliary pathology or other acute emergent process at this time.  Discharged with strict return precautions.  Pt agreeable with plan.    Threasa Beards, MD 12/18/13 [phone]

## 2013-12-18 NOTE — ED Notes (Signed)
PO fluids given

## 2013-12-18 NOTE — ED Notes (Signed)
Pt started vomiting up gingerale.

## 2013-12-18 NOTE — Discharge Instructions (Signed)
Return to the ED with any concerns including vomiting and not able to keep down liquids, abdominal pain especially if it localizes to the right lower abdomen, decreased level of alertness/lethargy, or any other alarming symptoms °

## 2013-12-19 ENCOUNTER — Encounter (HOSPITAL_COMMUNITY): Payer: Self-pay | Admitting: Family Medicine

## 2014-02-07 ENCOUNTER — Encounter (HOSPITAL_COMMUNITY): Payer: Self-pay | Admitting: *Deleted

## 2014-02-07 ENCOUNTER — Emergency Department (HOSPITAL_COMMUNITY): Payer: Medicaid Other

## 2014-02-07 ENCOUNTER — Emergency Department (HOSPITAL_COMMUNITY)
Admission: EM | Admit: 2014-02-07 | Discharge: 2014-02-07 | Disposition: A | Discharge status: Home / Self Care | Payer: Medicaid Other | Attending: Emergency Medicine | Admitting: Emergency Medicine

## 2014-02-07 DIAGNOSIS — Z8619 Personal history of other infectious and parasitic diseases: Secondary | ICD-10-CM | POA: Insufficient documentation

## 2014-02-07 DIAGNOSIS — Z862 Personal history of diseases of the blood and blood-forming organs and certain disorders involving the immune mechanism: Secondary | ICD-10-CM | POA: Diagnosis not present

## 2014-02-07 DIAGNOSIS — W2201XA Walked into wall, initial encounter: Secondary | ICD-10-CM | POA: Diagnosis not present

## 2014-02-07 DIAGNOSIS — S60221A Contusion of right hand, initial encounter: Secondary | ICD-10-CM

## 2014-02-07 DIAGNOSIS — Z87440 Personal history of urinary (tract) infections: Secondary | ICD-10-CM | POA: Insufficient documentation

## 2014-02-07 DIAGNOSIS — F319 Bipolar disorder, unspecified: Secondary | ICD-10-CM | POA: Diagnosis not present

## 2014-02-07 DIAGNOSIS — Z79899 Other long term (current) drug therapy: Secondary | ICD-10-CM | POA: Insufficient documentation

## 2014-02-07 DIAGNOSIS — Z8679 Personal history of other diseases of the circulatory system: Secondary | ICD-10-CM | POA: Insufficient documentation

## 2014-02-07 DIAGNOSIS — Y9389 Activity, other specified: Secondary | ICD-10-CM | POA: Insufficient documentation

## 2014-02-07 DIAGNOSIS — Q0700 Arnold-Chiari syndrome without spina bifida or hydrocephalus: Secondary | ICD-10-CM | POA: Insufficient documentation

## 2014-02-07 DIAGNOSIS — S6991XA Unspecified injury of right wrist, hand and finger(s), initial encounter: Secondary | ICD-10-CM | POA: Diagnosis present

## 2014-02-07 DIAGNOSIS — Y9289 Other specified places as the place of occurrence of the external cause: Secondary | ICD-10-CM | POA: Diagnosis not present

## 2014-02-07 DIAGNOSIS — Y998 Other external cause status: Secondary | ICD-10-CM | POA: Insufficient documentation

## 2014-02-07 MED ORDER — IBUPROFEN 600 MG PO TABS: 600.0000 mg | ORAL_TABLET | Freq: Four times a day (QID) | ORAL | Status: DC | PRN

## 2014-02-07 MED ORDER — IBUPROFEN 200 MG PO TABS
600.0000 mg | ORAL_TABLET | Freq: Once | ORAL | Status: AC
  Administered 2014-02-07: 600 mg via ORAL
  Filled 2014-02-07: qty 3
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
## 2014-02-07 NOTE — Discharge Instructions (Signed)
Cryotherapy Cryotherapy is when you put ice on your injury. Ice helps lessen pain and puffiness (swelling) after an injury. Ice works the best when you start using it in the first 24 to 48 hours after an injury. HOME CARE  Put a dry or damp towel between the ice pack and your skin.  You may press gently on the ice pack.  Leave the ice on for no more than 10 to 20 minutes at a time.  Check your skin after 5 minutes to make sure your skin is okay.  Rest at least 20 minutes between ice pack uses.  Stop using ice when your skin loses feeling (numbness).  Do not use ice on someone who cannot tell you when it hurts. This includes small children and people with memory problems (dementia). GET HELP RIGHT AWAY IF:  You have white spots on your skin.  Your skin turns blue or pale.  Your skin feels waxy or hard.  Your puffiness gets worse. MAKE SURE YOU:   Understand these instructions.  Will watch your condition.  Will get help right away if you are not doing well or get worse. Document Released: 07/23/2007 Document Revised: 04/28/2011 Document Reviewed: 09/26/2010 [employer] Patient Information 2015 [employer]. This information is not intended to replace advice given to you by your health care provider. Make sure you discuss any questions you have with your health care provider.  

## 2014-04-24 ENCOUNTER — Encounter: Payer: Self-pay | Admitting: *Deleted

## 2014-05-15 ENCOUNTER — Ambulatory Visit: Payer: Self-pay | Admitting: Family Medicine

## 2014-06-23 ENCOUNTER — Encounter (HOSPITAL_COMMUNITY): Payer: Self-pay

## 2014-06-23 ENCOUNTER — Emergency Department (HOSPITAL_COMMUNITY)
Admission: EM | Admit: 2014-06-23 | Discharge: 2014-06-23 | Disposition: A | Discharge status: Home / Self Care | Payer: Medicaid Other | Attending: Emergency Medicine | Admitting: Emergency Medicine

## 2014-06-23 DIAGNOSIS — H8110 Benign paroxysmal vertigo, unspecified ear: Secondary | ICD-10-CM | POA: Diagnosis not present

## 2014-06-23 DIAGNOSIS — J029 Acute pharyngitis, unspecified: Secondary | ICD-10-CM | POA: Diagnosis present

## 2014-06-23 DIAGNOSIS — Z3202 Encounter for pregnancy test, result negative: Secondary | ICD-10-CM | POA: Diagnosis not present

## 2014-06-23 DIAGNOSIS — Z862 Personal history of diseases of the blood and blood-forming organs and certain disorders involving the immune mechanism: Secondary | ICD-10-CM | POA: Diagnosis not present

## 2014-06-23 DIAGNOSIS — J020 Streptococcal pharyngitis: Secondary | ICD-10-CM | POA: Diagnosis not present

## 2014-06-23 DIAGNOSIS — Z8619 Personal history of other infectious and parasitic diseases: Secondary | ICD-10-CM | POA: Insufficient documentation

## 2014-06-23 DIAGNOSIS — Z87440 Personal history of urinary (tract) infections: Secondary | ICD-10-CM | POA: Insufficient documentation

## 2014-06-23 DIAGNOSIS — G43909 Migraine, unspecified, not intractable, without status migrainosus: Secondary | ICD-10-CM | POA: Diagnosis not present

## 2014-06-23 DIAGNOSIS — Z79899 Other long term (current) drug therapy: Secondary | ICD-10-CM | POA: Insufficient documentation

## 2014-06-23 DIAGNOSIS — F319 Bipolar disorder, unspecified: Secondary | ICD-10-CM | POA: Diagnosis not present

## 2014-06-23 DIAGNOSIS — G43009 Migraine without aura, not intractable, without status migrainosus: Secondary | ICD-10-CM

## 2014-06-23 LAB — RAPID STREP SCREEN (MED CTR MEBANE ONLY): Streptococcus, Group A Screen (Direct): POSITIVE — AB

## 2014-06-23 MED ORDER — KETOROLAC TROMETHAMINE 60 MG/2ML IM SOLN
60.0000 mg | Freq: Once | INTRAMUSCULAR | Status: AC
  Administered 2014-06-23: 60 mg via INTRAMUSCULAR
  Filled 2014-06-23: qty 2

## 2014-06-23 MED ORDER — AMOXICILLIN 500 MG PO CAPS: 500.0000 mg | ORAL_CAPSULE | Freq: Three times a day (TID) | ORAL | Status: DC

## 2014-06-23 MED ORDER — MECLIZINE HCL 50 MG PO TABS: 25.0000 mg | ORAL_TABLET | Freq: Every day | ORAL | Status: DC | PRN

## 2014-06-23 MED ORDER — MECLIZINE HCL 50 MG PO TABS: 25.0000 mg | ORAL_TABLET | Freq: Three times a day (TID) | ORAL | Status: DC | PRN

## 2014-06-23 MED ORDER — DEXAMETHASONE SODIUM PHOSPHATE 10 MG/ML IJ SOLN
10.0000 mg | Freq: Once | INTRAMUSCULAR | Status: AC
  Administered 2014-06-23: 10 mg via INTRAMUSCULAR
  Filled 2014-06-23: qty 1

## 2014-06-23 MED ORDER — AMOXICILLIN 500 MG PO CAPS: 500.0000 mg | ORAL_CAPSULE | Freq: Two times a day (BID) | ORAL | Status: DC

## 2014-06-23 NOTE — Discharge Instructions (Signed)
Migraine Headache A migraine headache is an intense, throbbing pain on one or both sides of your head. A migraine can last for 30 minutes to several hours. CAUSES  The exact cause of a migraine headache is not always known. However, a migraine may be caused when nerves in the brain become irritated and release chemicals that cause inflammation. This causes pain. Certain things may also trigger migraines, such as:  Alcohol.  Smoking.  Stress.  Menstruation.  Aged cheeses.  Foods or drinks that contain nitrates, glutamate, aspartame, or tyramine.  Lack of sleep.  Chocolate.  Caffeine.  Hunger.  Physical exertion.  Fatigue.  Medicines used to treat chest pain (nitroglycerine), birth control pills, estrogen, and some blood pressure medicines. SIGNS AND SYMPTOMS  Pain on one or both sides of your head.  Pulsating or throbbing pain.  Severe pain that prevents daily activities.  Pain that is aggravated by any physical activity.  Nausea, vomiting, or both.  Dizziness.  Pain with exposure to bright lights, loud noises, or activity.  General sensitivity to bright lights, loud noises, or smells. Before you get a migraine, you may get warning signs that a migraine is coming (aura). An aura may include:  Seeing flashing lights.  Seeing bright spots, halos, or zigzag lines.  Having tunnel vision or blurred vision.  Having feelings of numbness or tingling.  Having trouble talking.  Having muscle weakness. DIAGNOSIS  A migraine headache is often diagnosed based on:  Symptoms.  Physical exam.  A CT scan or MRI of your head. These imaging tests cannot diagnose migraines, but they can help rule out other causes of headaches. TREATMENT Medicines may be given for pain and nausea. Medicines can also be given to help prevent recurrent migraines.  HOME CARE INSTRUCTIONS  Only take over-the-counter or prescription medicines for pain or discomfort as directed by your  health care provider. The use of long-term narcotics is not recommended.  Lie down in a dark, quiet room when you have a migraine.  Keep a journal to find out what may trigger your migraine headaches. For example, write down:  What you eat and drink.  How much sleep you get.  Any change to your diet or medicines.  Limit alcohol consumption.  Quit smoking if you smoke.  Get 7-9 hours of sleep, or as recommended by your health care provider.  Limit stress.  Keep lights dim if bright lights bother you and make your migraines worse. SEEK IMMEDIATE MEDICAL CARE IF:   Your migraine becomes severe.  You have a fever.  You have a stiff neck.  You have vision loss.  You have muscular weakness or loss of muscle control.  You start losing your balance or have trouble walking.  You feel faint or pass out.  You have severe symptoms that are different from your first symptoms. MAKE SURE YOU:   Understand these instructions.  Will watch your condition.  Will get help right away if you are not doing well or get worse. Document Released: 02/03/2005 Document Revised: 06/20/2013 Document Reviewed: 10/11/2012 [facility] Patient Information 2015 [place], [place]. This information is not intended to replace advice given to you by your health care provider. Make sure you discuss any questions you have with your health care provider.  Strep Throat Strep throat is an infection of the throat caused by a bacteria named Streptococcus pyogenes. Your health care provider may call the infection streptococcal "tonsillitis" or "pharyngitis" depending on whether there are signs of inflammation in the  tonsils or back of the throat. Strep throat is most common in children aged 5-15 years during the cold months of the year, but it can occur in people of any age during any season. This infection is spread from person to person (contagious) through coughing, sneezing, or other close contact. SIGNS AND  SYMPTOMS  Fever or chills. Painful, swollen, red tonsils or throat. Pain or difficulty when swallowing. White or yellow spots on the tonsils or throat. Swollen, tender lymph nodes or "glands" of the neck or under the jaw. Red rash all over the body (rare). DIAGNOSIS  Many different infections can cause the same symptoms. A test must be done to confirm the diagnosis so the right treatment can be given. A "rapid strep test" can help your health care provider make the diagnosis in a few minutes. If this test is not available, a light swab of the infected area can be used for a throat culture test. If a throat culture test is done, results are usually available in a day or two. TREATMENT  Strep throat is treated with antibiotic medicine. HOME CARE INSTRUCTIONS  Gargle with 1 tsp of salt in 1 cup of warm water, 3-4 times per day or as needed for comfort. Family members who also have a sore throat or fever should be tested for strep throat and treated with antibiotics if they have the strep infection. Make sure everyone in your household washes their hands well. Do not share food, drinking cups, or personal items that could cause the infection to spread to others. You may need to eat a soft food diet until your sore throat gets better. Drink enough water and fluids to keep your urine clear or pale yellow. This will help prevent dehydration. Get plenty of rest. Stay home from school, day care, or work until you have been on antibiotics for 24 hours. Take medicines only as directed by your health care provider. Take your antibiotic medicine as directed by your health care provider. Finish it even if you start to feel better. SEEK MEDICAL CARE IF:  The glands in your neck continue to enlarge. You develop a rash, cough, or earache. You cough up green, yellow-brown, or bloody sputum. You have pain or discomfort not controlled by medicines. Your problems seem to be getting worse rather than  better. You have a fever. SEEK IMMEDIATE MEDICAL CARE IF:  You develop any new symptoms such as vomiting, severe headache, stiff or painful neck, chest pain, shortness of breath, or trouble swallowing. You develop severe throat pain, drooling, or changes in your voice. You develop swelling of the neck, or the skin on the neck becomes red and tender. You develop signs of dehydration, such as fatigue, dry mouth, and decreased urination. You become increasingly sleepy, or you cannot wake up completely. MAKE SURE YOU: Understand these instructions. Will watch your condition. Will get help right away if you are not doing well or get worse. Document Released: 02/01/2000 Document Revised: 06/20/2013 Document Reviewed: 04/04/2010 [facility] Patient Information 2015 [place], [place]. This information is not intended to replace advice given to you by your health care provider. Make sure you discuss any questions you have with your health care provider.   Benign Positional Vertigo Vertigo means you feel like you or your surroundings are moving when they are not. Benign positional vertigo is the most common form of vertigo. Benign means that the cause of your condition is not serious. Benign positional vertigo is more common in older adults. CAUSES  Benign positional vertigo is the result of an upset in the labyrinth system. This is an area in the middle ear that helps control your balance. This may be caused by a viral infection, head injury, or repetitive motion. However, often no specific cause is found. SYMPTOMS  Symptoms of benign positional vertigo occur when you move your head or eyes in different directions. Some of the symptoms may include:  Loss of balance and falls.  Vomiting.  Blurred vision.  Dizziness.  Nausea.  Involuntary eye movements (nystagmus). DIAGNOSIS  Benign positional vertigo is usually diagnosed by physical exam. If the specific cause of your benign positional vertigo is  unknown, your caregiver may perform imaging tests, such as magnetic resonance imaging (MRI) or computed tomography (CT). TREATMENT  Your caregiver may recommend movements or procedures to correct the benign positional vertigo. Medicines such as meclizine, benzodiazepines, and medicines for nausea may be used to treat your symptoms. In rare cases, if your symptoms are caused by certain conditions that affect the inner ear, you may need surgery. HOME CARE INSTRUCTIONS   Follow your caregiver's instructions.  Move slowly. Do not make sudden body or head movements.  Avoid driving.  Avoid operating heavy machinery.  Avoid performing any tasks that would be dangerous to you or others during a vertigo episode.  Drink enough fluids to keep your urine clear or pale yellow. SEEK IMMEDIATE MEDICAL CARE IF:   You develop problems with walking, weakness, numbness, or using your arms, hands, or legs.  You have difficulty speaking.  You develop severe headaches.  Your nausea or vomiting continues or gets worse.  You develop visual changes.  Your family or friends notice any behavioral changes.  Your condition gets worse.  You have a fever.  You develop a stiff neck or sensitivity to light. MAKE SURE YOU:   Understand these instructions.  Will watch your condition.  Will get help right away if you are not doing well or get worse. Document Released: 11/11/2005 Document Revised: 04/28/2011 Document Reviewed: 10/24/2010 [facility] Patient Information 2015 [place], [place]. This information is not intended to replace advice given to you by your health care provider. Make sure you discuss any questions you have with your health care provider.   Emergency Department Resource Guide 1) Find a Doctor and Pay Out of Pocket Although you won't have to find out who is covered by your insurance plan, it is a good idea to ask around and get recommendations. You will then need to call the office and see  if the doctor you have chosen will accept you as a new patient and what types of options they offer for patients who are self-pay. Some doctors offer discounts or will set up payment plans for their patients who do not have insurance, but you will need to ask so you aren't surprised when you get to your appointment.  2) Contact Your Local Health Department Not all health departments have doctors that can see patients for sick visits, but many do, so it is worth a call to see if yours does. If you don't know where your local health department is, you can check in your phone book. The CDC also has a tool to help you locate your state's health department, and many state websites also have listings of all of their local health departments.  3) Find a [facility] If your illness is not likely to be very severe or complicated, you may want to try a walk in clinic. These  are popping up all over the country in pharmacies, drugstores, and shopping centers. They're usually staffed by nurse practitioners or physician assistants that have been trained to treat common illnesses and complaints. They're usually fairly quick and inexpensive. However, if you have serious medical issues or chronic medical problems, these are probably not your best option.  No Primary Care Doctor: - Call Health Connect at  [phone] - they can help you locate a primary care doctor that  accepts your insurance, provides certain services, etc. - Physician Referral Service- [phone]  Chronic Pain Problems: Organization         Address  Phone   Notes  [facility]  [phone] Patients need to be referred by their primary care doctor.   Medication Assistance: Organization         Address  Phone   Notes  [facility] Medication [facility] [address] [place], [place] [address] [phone] --Must be a resident of [facility] -- Must have NO insurance coverage whatsoever (no  Medicaid/ Medicare, etc.) -- The pt. MUST have a primary care doctor that directs their care regularly and follows them in the community   Med[facility]ist  [phone]   [employer]  [phone]    Agencies that provide inexpensive medical care: Organization         Address  Phone   Notes  [place]  [phone]   [facility]    [phone]   [facility] [place], [place] [address] [phone]   [place] [address] [place] [phone]   Planned Parenthood    [phone]   [facility]    [phone]   [place] and [place] [address], [place] Phone:  [phone], Fax:  [phone] Hours of Operation:  9 am - 6 pm, M-F.  Also accepts Medicaid/Medicare and self-pay.  [facility] for [address], [address], [place] Phone: [phone], Fax: [phone]. Hours of Operation:  8:30 am - 5:30 pm, M-F.  Also accepts Medicaid and self-pay.  [facility] [place] [address] [place] Phone: [phone]   [place], [place], [place] [phone], Ext. [phone] Mondays & Thursdays: 7-9 AM.  First 15 patients are seen on a first come, first serve basis.    [place] Providers:  Organization         Address  Phone   Notes  [facility] [address], [place] [phone] Also accepts self-pay patients.  [facility] [address], [place]  [phone]   [place], [address], [place] [phone]   [facility] [address], [place] [phone]   Lucianne Lei [address], [place]   [phone] Only accepts [place] Access [place] patients after they have their name applied to their card.    Self-Pay (no insurance) in [facility]:  Organization         Address  Phone   Notes  Sickle Cell Patients, [facility] [place] [phone]   [facility] [place] [phone]   [facility] Urgent [place]  [address], [address], [place] [phone]   [facility]/Dr. Osei-Bonsu  [address], [place] or [address], [place] [phone] Phone number for both [place] and [place] locations is the same.  Urgent Medical and [facility] [address] [place] [phone]   [facility] [address], [place] or [address] [phone]   [facility] [address], [place] [phone], phone; [phone], fax Sees patients 1st and 3rd Saturday of every month.  Must not qualify for public or private insurance (i.e. Medicaid, Medicare, [place] Health Choice, Veterans' Benefits)  Household income should be no more than 200% of the poverty level The clinic cannot treat you if you are pregnant or think you are pregnant  Sexually transmitted diseases are not treated at the clinic.    Dental Care: Organization         Address  Phone  Notes  [facility] Department of [facility] [place] [phone] Accepts children up to age 28 who are enrolled in [place] or [place]; pregnant women with a Medicaid card; and children who have applied for Medicaid or [place] Health Choice, but were declined, whose parents can pay a reduced fee at time of service.  [facility] Department of [facility]  [address], [place] [phone] Accepts children up to age 71 who are enrolled in [place] or [place]; pregnant women with a Medicaid card; and children who have applied for Medicaid or [place] Health Choice,  but were declined, whose parents can pay a reduced fee at time of service.  [facility] Access PROGRAM  [place] [phone] Patients are seen by appointment only. Walk-ins are not accepted. [place] will see patients 54 years of age and older. Monday - Tuesday (8am-5pm) Most Wednesdays (8:30-5pm) $30 per visit, cash only  [facility] Adult [facility] Access PROGRAM  [address], [facility] [phone] Patients are seen by appointment only. Walk-ins are not accepted. [place] will see patients 56 years of age and older. One Wednesday Evening (Monthly: Volunteer Based).  $30 per visit, cash only  [place]  [phone] for adults; Children under age 11, call Graduate Pediatric Dentistry at [phone]. Children aged 37-14, please call [phone] to request a pediatric application.  Dental services are provided in all areas of dental care including fillings, crowns and bridges, complete and partial dentures, implants, gum treatment, root canals, and extractions. Preventive care is also provided. Treatment is provided to both adults and children. Patients are selected via a lottery and there is often a waiting list.   [facility] [address], [place]  [phone] [email or web address]   [facility] [address] [place], [place] [phone], Ext. [phone] Second and Fourth Thursday of each month, opens at 6:30 AM; Clinic ends at 9 AM.  Patients are seen on a first-come first-served basis, and a limited number are seen during each clinic.   [facility]  [address] [place]ard [place]er [place], [place] [phone]   Eligibility Requirements You must have lived in [place], [place], or [place] counties for at least the last three months.   You cannot be eligible for state or federal sponsored [employer], including [employer], [place], or  Medicare.   You generally  cannot be eligible for healthcare insurance through your employer.    How to apply: Eligibility screenings are held every Tuesday and Wednesday afternoon from 1:00 pm until 4:00 pm. You do not need an appointment for the interview!  [facility] [address] [place], [place]   [place]  [place] Department  [place]  [phone]    Behavioral Health Resources in the Community: Intensive Outpatient Programs Organization         Address  Phone  Notes  [place]. [address], [place], [place] [phone]   [facility]patient [address] [place], [place]   ADS: [facility] Drug [facility] [address] [place], [place]   [place] [address]  [place], [place] or [phone]   Substance Abuse Resources Organization         Address  Phone  Notes  Alcohol and Drug Services  [phone]   [place]  [phone]   [facility] [place]   Chinita Pester  [phone]   Residential & Outpatient Substance Abuse Program  [phone]   Psychological Services Organization         Address  Phone  Notes  [facility] [place]  [place]  [phone]   [place] [address], [place] or [phone]    Mobile Crisis Teams Organization         Address  Phone  Notes  [facility], Mobile Crisis Care Unit  [phone]   [facility]  [address] [place], [place]   [place] [address] [place] [phone]    Self-Help/Support Groups Organization         Address  Phone             Notes  [place]. of [place] - variety of support groups  [place] Call for more  information  Narcotics Anonymous (NA), [facility] [address], [employer] [place]  2 meetings at this location   Special educational needs teacher         Address  Phone  Notes  [facility] [place],    [place]  [phone]   [facility]  [address] [place] [address] [place] [address]37628, [place], [place]   [place], [place] [phone] Admissions: 8am-3pm M-F  Incentives Substance [place] [address],    [place], [place] [phone]   [facility] [address] [place], [place], [place]   [facility] [address]  [place], [place]   [facility] Programs - Intensive Outpatient [address], [place], [place]   [facility] [place]  [place], [place] [phone] or [phone]   [facility] (RTS) [address], [place], [place] Accepts Medicaid  [facility] [place] [address]  [place] [phone] Substance Abuse/Addiction Treatment   [facility] Organization         Address  Phone  Notes  [facility]  [phone]   Domenic Schwab, PhD [address] [place], [place]   [phone] or [phone]   [facility]arias Pontes  Behavioral   [place], [place] [phone]   [facility] [address], [place], [place] [phone] Insurance/Medicaid/sponsorship through [facility] and Families [address] [place], [place] [phone] Angie [place], [place] [phone]    Dr. Adele Schilder  [phone]   [facility] Dept. 1) [address] [place] 2) [place] 3)  [place] [address], [place] [phone]  [phone]   [place] [phone] or [phone] (After Hours)

## 2014-06-23 NOTE — ED Provider Notes (Signed)
CSN: [id number]     Arrival date & time 06/23/14  1221 History  This chart was scribed for non-physician practitioner Brent General, PA-C working with No att. providers found by Lora Havens, ED Scribe. This patient was seen in [facility] and the patient's care was started at 1:30 PM.   Chief Complaint  Patient presents with  . Headache  . Sore Throat   Patient is a 29 y.o. female presenting with headaches and pharyngitis. The history is provided by the patient. No language interpreter was used.  Headache Associated symptoms: dizziness and sore throat   Associated symptoms: no abdominal pain, no fever, no nausea, no neck pain, no numbness, no vomiting and no weakness   Sore Throat Associated symptoms include headaches. Pertinent negatives include no chest pain, no abdominal pain and no shortness of breath.    HPI Comments: Martha Mathews is a 29 y.o. female who presents to the Emergency Department complaining of headache onset last night. Sore throat began this morning. Pt has a history of migraines and this current headache is consistent with past headaches. Headache is gradually worsening and she has associated blurry vision and dizziness. Blurry vision is a new symptom associated with her migraine, however patient states her visual acuity has not changed, and she currently is not wearing glasses she is described for. Patient describes her dizziness as being a spinning sensation only when she turns her head in certain directions quickly, and only when standing up quickly. She states this dizziness is consistent with an identical to vertigo she has experienced in the past. Patient states she takes meclizine for this which typically helps at home. Patient states she typically does not take any medicine for relief of her migraines. Son was also sick but his  strep test was negative. She denies nausea and vomiting, fever, chills, chest pain, shortness of breath, numbness, weakness.   Past Medical  History  Diagnosis Date  . 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 Bipolar 1 disorder   . 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 Urinary tract infection   . Depression     not currently on meds, being treated- trying to find a med  . Abnormal Pap smear   . Chlamydia 2004  . Anemia     HISTORY W/2006 PREGNANCY  . Dyslexia   . Migraines     otc med prn w/pregnancy   Past Surgical History  Procedure Laterality Date  . Cesarean section    . Myringotomy    . Tonsillectomy    . Induced abortion      vaccum on oct 2009  . Cholecystectomy  2006  . Eye surgery      left eye surgery - lazy eye  . Cesarean section N/A 04/21/2012    Procedure: CESAREAN SECTION;  Surgeon: Cheri Fowler, MD;  Location: [facility];  Service: Obstetrics;  Laterality: N/A;  Repeat  . Bilateral salpingectomy Bilateral 04/21/2012    Procedure: BILATERAL SALPINGECTOMY;  Surgeon: Cheri Fowler, MD;  Location: [facility];  Service: Obstetrics;  Laterality: Bilateral;   Family History  Problem Relation Age of Onset  . Cancer Maternal Grandmother     Breast cancer  . Cancer Paternal Grandmother     Breast cancer  . Stroke Paternal Grandmother 79  . Diabetes Cousin   . Hypertension Cousin   . Other Neg Hx    History  Substance Use Topics  . Smoking  status: Never Smoker   . Smokeless tobacco: Never Used  . Alcohol Use: No     Comment: socially but none with pregnancy   OB History    Gravida Para Term Preterm AB TAB SAB Ectopic Multiple Living   3 2 2  0 1 1    2      Review of Systems  Constitutional: Negative for fever.  HENT: Positive for sore throat. Negative for trouble swallowing.   Eyes: Positive for visual disturbance.  Respiratory: Negative for shortness of breath.   Cardiovascular: Negative for chest pain.  Gastrointestinal: Negative for nausea, vomiting and abdominal pain.  Genitourinary: Negative for dysuria.  Musculoskeletal: Negative for neck pain.  Skin: Negative for  rash.  Neurological: Positive for dizziness and headaches. Negative for weakness and numbness.  Psychiatric/Behavioral: Negative.    Allergies  Hydrocodone-acetaminophen and Adhesive  Home Medications   Prior to Admission medications   Medication Sig Start Date End Date Taking? Authorizing Provider  amoxicillin (AMOXIL) 500 MG capsule Take 1 capsule (500 mg total) by mouth 2 (two) times daily. 06/23/14   Dahlia Bailiff, PA-C  ARIPiprazole (ABILIFY) 15 MG tablet Take 15 mg by mouth daily.    Historical Provider, MD  escitalopram (LEXAPRO) 20 MG tablet Take 0.5 tablets (10 mg total) by mouth daily. Then increase to 20mg  daily after 1 week 09/14/12   Wilber Oliphant, MD  ibuprofen (ADVIL,MOTRIN) 600 MG tablet Take 1 tablet (600 mg total) by mouth every 6 (six) hours as needed. 02/07/14   Junius Creamer, NP  meclizine (ANTIVERT) 50 MG tablet Take 0.5 tablets (25 mg total) by mouth daily as needed. Take 0.5-1 tablet (25-50 mg total) by mouth daily as needed 06/23/14   Dahlia Bailiff, PA-C  ondansetron (ZOFRAN) 4 MG tablet Take 1 tablet (4 mg total) by mouth every 6 (six) hours. 12/18/13   Alfonzo Beers, MD   BP 131/81 mmHg  Pulse 80  Temp(Src) 97.7 F (36.5 C) (Oral)  Resp 16  SpO2 99% Physical Exam  Constitutional: She is oriented to person, place, and time. She appears well-developed and well-nourished. No distress.  HENT:  Head: Normocephalic and atraumatic.  Right Ear: Tympanic membrane normal.  Left Ear: Tympanic membrane normal.  Mouth/Throat: Uvula is midline. No oral lesions. No trismus in the jaw. No dental abscesses or uvula swelling. Oropharyngeal exudate, posterior oropharyngeal edema and posterior oropharyngeal erythema present. No tonsillar abscesses.  Status post tonsillectomy bilaterally. Mild posterior oropharyngeal erythema with mild exudate. No trismus.  Eyes: EOM are normal. Pupils are equal, round, and reactive to light. Right eye exhibits no discharge. Left eye exhibits no discharge.  No scleral icterus.  Neck: Normal range of motion and full passive range of motion without pain. Neck supple. No spinous process tenderness and no muscular tenderness present. No rigidity. No edema, no erythema and normal range of motion present. No Brudzinski's sign and no Kernig's sign noted.  Cardiovascular: Normal rate, regular rhythm and normal heart sounds.   No murmur heard. Pulmonary/Chest: Effort normal and breath sounds normal. No respiratory distress.  Abdominal: Soft. There is no tenderness.  Musculoskeletal: Normal range of motion. She exhibits no edema or tenderness.  Neurological: She is alert and oriented to person, place, and time. She has normal strength. No cranial nerve deficit or sensory deficit. She displays a negative Romberg sign. Coordination normal. GCS eye subscore is 4. GCS verbal subscore is 5. GCS motor subscore is 6.  Patient fully alert, answering questions appropriately in full, clear sentences.  Cranial nerves II through XII grossly intact. Motor strength 5 out of 5 in all major muscle groups of upper and lower extremities. Distal sensation intact.   Skin: Skin is warm and dry. No rash noted. She is not diaphoretic.  Psychiatric: She has a normal mood and affect. Her behavior is normal.  Nursing note and vitals reviewed.   ED Course  Procedures  DIAGNOSTIC STUDIES: Oxygen Saturation is 99% on room air, normal by my interpretation.    COORDINATION OF CARE: 1:40 PM Discussed treatment plan with pt at bedside and pt agreed to plan.  Labs Review Labs Reviewed  RAPID STREP SCREEN - Abnormal; Notable for the following:    Streptococcus, Group A Screen (Direct) POSITIVE (*)    All other components within normal limits  POC URINE PREG, ED    Imaging Review No results found.   EKG Interpretation None      MDM   Final diagnoses:  Atypical migraine  Strep pharyngitis  Benign positional vertigo, unspecified laterality   Patient here with several  complaints  1. Headache Pt HA treated and improved while in ED.  Presentation is like pts typical HA and non concerning for [facility], ICH, Meningitis, or temporal arteritis. Pt is afebrile with no focal neuro deficits, nuchal rigidity. Patient reports mild change in vision initially, however states she is not wearing her prescribed corrective lenses. Patient states her vision changes have improved after therapy here, despite abnormal visual acuity test, patient stating her visual acuity feels it is back at baseline. Pt is to follow up with PCP to discuss prophylactic medication. Pt verbalizes understanding and is agreeable with plan to dc.   2. Dizziness Patient describing this dizziness as being consistent with vertigo she has experienced in the past. She states there is nothing different about it today, however does not have any meclizine at home. We'll prescribe patient with short course of meclizine to help.  3. Sore throat Signs and symptoms consistent with a bacterial pharyngitis. Positive strep test. Pt afebrile with tonsillar exudate, cervical lymphadenopathy, & dysphagia; diagnosis of strep. Treated in the Ed with steroids, NSAIDs, Pain medication.  Pt appears mildly dehydrated, discussed importance of water rehydration. Presentation non concerning for PTA based on tonsillectomy or retropharyngeal abscess based on exam or infxn spread to soft tissue. No trismus or uvula deviation. Specific return precautions discussed. Pt able to drink water in ED without difficulty with intact air way. Recommended PCP follow up.   Patient afebrile, hemodynamically stable, well-appearing and in no acute distress. Patient reporting symptoms overall have improved after therapy here, stating she feels much better. We'll discharge patient at this time, discussed return precautions and encourage follow up with PCP. Patient verbalizes understanding and agreement of this plan.  I personally performed the services described  in this documentation, which was scribed in my presence. The recorded information has been reviewed and is accurate.  BP 131/81 mmHg  Pulse 80  Temp(Src) 97.7 F (36.5 C) (Oral)  Resp 16  SpO2 99%  Signed,  Dahlia Bailiff, PA-C 9:35 PM  Patient discussed with Dr. Carmin Muskrat, M.D.    Dahlia Bailiff, PA-C 06/23/14 2135  Dahlia Bailiff, PA-C 06/23/14 2135  Carmin Muskrat, MD 06/25/14 1055

## 2014-06-23 NOTE — ED Notes (Signed)
Patient is suppose to wear glasses but does not have them due to insurance.

## 2014-06-23 NOTE — ED Notes (Signed)
Pt states headache and sore throat since last night.  Unknown for fever.   Hurts to swallow.  Hx of strep throat.  Had tonsils removed when she was 54.

## 2014-06-28 ENCOUNTER — Encounter: Payer: Self-pay | Admitting: *Deleted

## 2014-07-14 ENCOUNTER — Emergency Department (HOSPITAL_COMMUNITY)
Admission: EM | Admit: 2014-07-14 | Discharge: 2014-07-14 | Disposition: A | Discharge status: Home / Self Care | Payer: Medicaid Other | Attending: Emergency Medicine | Admitting: Emergency Medicine

## 2014-07-14 ENCOUNTER — Encounter (HOSPITAL_COMMUNITY): Payer: Self-pay | Admitting: Emergency Medicine

## 2014-07-14 ENCOUNTER — Emergency Department (HOSPITAL_COMMUNITY): Payer: Medicaid Other

## 2014-07-14 DIAGNOSIS — Y92091 Bathroom in other non-institutional residence as the place of occurrence of the external cause: Secondary | ICD-10-CM | POA: Diagnosis not present

## 2014-07-14 DIAGNOSIS — S4991XA Unspecified injury of right shoulder and upper arm, initial encounter: Secondary | ICD-10-CM | POA: Diagnosis not present

## 2014-07-14 DIAGNOSIS — Y998 Other external cause status: Secondary | ICD-10-CM | POA: Diagnosis not present

## 2014-07-14 DIAGNOSIS — F319 Bipolar disorder, unspecified: Secondary | ICD-10-CM | POA: Diagnosis not present

## 2014-07-14 DIAGNOSIS — W19XXXA Unspecified fall, initial encounter: Secondary | ICD-10-CM

## 2014-07-14 DIAGNOSIS — W182XXA Fall in (into) shower or empty bathtub, initial encounter: Secondary | ICD-10-CM | POA: Diagnosis not present

## 2014-07-14 DIAGNOSIS — Z79899 Other long term (current) drug therapy: Secondary | ICD-10-CM | POA: Insufficient documentation

## 2014-07-14 DIAGNOSIS — Z87440 Personal history of urinary (tract) infections: Secondary | ICD-10-CM | POA: Insufficient documentation

## 2014-07-14 DIAGNOSIS — Z792 Long term (current) use of antibiotics: Secondary | ICD-10-CM | POA: Diagnosis not present

## 2014-07-14 DIAGNOSIS — Q0700 Arnold-Chiari syndrome without spina bifida or hydrocephalus: Secondary | ICD-10-CM | POA: Insufficient documentation

## 2014-07-14 DIAGNOSIS — Z8619 Personal history of other infectious and parasitic diseases: Secondary | ICD-10-CM | POA: Insufficient documentation

## 2014-07-14 DIAGNOSIS — M25511 Pain in right shoulder: Secondary | ICD-10-CM

## 2014-07-14 DIAGNOSIS — Y93E1 Activity, personal bathing and showering: Secondary | ICD-10-CM | POA: Diagnosis not present

## 2014-07-14 LAB — I-STAT CHEM 8, ED
BUN: 7 mg/dL (ref 6–20)
Calcium, Ion: 1.2 mmol/L (ref 1.12–1.23)
Chloride: 105 mmol/L (ref 101–111)
Creatinine, Ser: 0.7 mg/dL (ref 0.44–1.00)
Glucose, Bld: 106 mg/dL — ABNORMAL HIGH (ref 65–99)
HEMATOCRIT: 40 % (ref 36.0–46.0)
HEMOGLOBIN: 13.6 g/dL (ref 12.0–15.0)
Potassium: 4.4 mmol/L (ref 3.5–5.1)
SODIUM: 139 mmol/L (ref 135–145)
TCO2: 23 mmol/L (ref 0–100)

## 2014-07-14 MED ORDER — OXYCODONE-ACETAMINOPHEN 5-325 MG PO TABS
1.0000 | ORAL_TABLET | Freq: Once | ORAL | Status: AC
  Administered 2014-07-14: 1 via ORAL
  Filled 2014-07-14: qty 1

## 2014-07-14 MED ORDER — MECLIZINE HCL 25 MG PO TABS
25.0000 mg | ORAL_TABLET | Freq: Once | ORAL | Status: AC
  Administered 2014-07-14: 25 mg via ORAL
  Filled 2014-07-14: qty 1

## 2014-07-14 MED ORDER — OXYCODONE-ACETAMINOPHEN 5-325 MG PO TABS: 1.0000 | ORAL_TABLET | ORAL | Status: DC | PRN

## 2014-07-14 MED ORDER — MECLIZINE HCL 50 MG PO TABS: 25.0000 mg | ORAL_TABLET | Freq: Three times a day (TID) | ORAL | Status: DC | PRN

## 2014-07-14 NOTE — ED Provider Notes (Signed)
CSN: [id number]     Arrival date & time 07/14/14  1048 History   First MD Initiated Contact with Patient 07/14/14 1135     Chief Complaint  Patient presents with  . Fall  . Shoulder Pain     (Consider location/radiation/quality/duration/timing/severity/associated sxs/prior Treatment) Patient is a 29 y.o. female presenting with fall and shoulder pain. The history is provided by the patient and medical records.  Fall Associated symptoms include arthralgias.  Shoulder Pain   This is a 29 y.o. F with PMH significant for bipolar disorder, depression, anemia, migraine headaches, vertigo, presenting to the ED following a fall.  Patient states she got in the shower this morning and began to feel somewhat lightheaded and that she room was spinning and fell onto her left shoulder.  She denies direct head injury or LOC.  States she does have hx of vertigo, did not take her meclizine this morning because she has run out.  She denies current headache, dizziness, lightheadedness, visual disturbance, changes in speech, confusion, numbness, weakness, or ataxia.  Has been ambulatory without difficulty.  No chest pain or SOB. Patient is right hand dominant.  No prior right shoulder injuries/surgeries.  No intervention tried PTA.  LMP around 06/18/14 Denies possibility of pregnancy.  Past Medical History  Diagnosis Date  . 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 Bipolar 1 disorder   . 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 Urinary tract infection   . Depression     not currently on meds, being treated- trying to find a med  . Abnormal Pap smear   . Chlamydia 2004  . Anemia     HISTORY W/2006 PREGNANCY  . Dyslexia   . Migraines     otc med prn w/pregnancy   Past Surgical History  Procedure Laterality Date  . Cesarean section    . Myringotomy    . Tonsillectomy    . Induced abortion      vaccum on oct 2009  . Cholecystectomy  2006  . Eye surgery      left eye surgery -  lazy eye  . Cesarean section N/A 04/21/2012    Procedure: CESAREAN SECTION;  Surgeon: Cheri Fowler, MD;  Location: [facility];  Service: Obstetrics;  Laterality: N/A;  Repeat  . Bilateral salpingectomy Bilateral 04/21/2012    Procedure: BILATERAL SALPINGECTOMY;  Surgeon: Cheri Fowler, MD;  Location: [facility];  Service: Obstetrics;  Laterality: Bilateral;   Family History  Problem Relation Age of Onset  . Cancer Maternal Grandmother     Breast cancer  . Cancer Paternal Grandmother     Breast cancer  . Stroke Paternal Grandmother 6  . Diabetes Cousin   . Hypertension Cousin   . Other Neg Hx    History  Substance Use Topics  . Smoking status: Never Smoker   . Smokeless tobacco: Never Used  . Alcohol Use: No     Comment: socially but none with pregnancy   OB History    Gravida Para Term Preterm AB TAB SAB Ectopic Multiple Living   3 2 2  0 1 1    2      Review of Systems  Musculoskeletal: Positive for arthralgias.  All other systems reviewed and are negative.     Allergies  Hydrocodone-acetaminophen and Adhesive  Home Medications   Prior to Admission medications   Medication Sig Start Date End Date Taking? Authorizing Provider  amoxicillin (AMOXIL) 500 MG  capsule Take 1 capsule (500 mg total) by mouth 2 (two) times daily. 06/23/14   Dahlia Bailiff, PA-C  ARIPiprazole (ABILIFY) 15 MG tablet Take 15 mg by mouth daily.    Historical Provider, MD  escitalopram (LEXAPRO) 20 MG tablet Take 0.5 tablets (10 mg total) by mouth daily. Then increase to 20mg  daily after 1 week 09/14/12   Wilber Oliphant, MD  ibuprofen (ADVIL,MOTRIN) 600 MG tablet Take 1 tablet (600 mg total) by mouth every 6 (six) hours as needed. 02/07/14   Junius Creamer, NP  meclizine (ANTIVERT) 50 MG tablet Take 0.5 tablets (25 mg total) by mouth daily as needed. Take 0.5-1 tablet (25-50 mg total) by mouth daily as needed 06/23/14   Dahlia Bailiff, PA-C  ondansetron (ZOFRAN) 4 MG tablet Take 1 tablet (4 mg total) by mouth every 6 (six)  hours. 12/18/13   Alfonzo Beers, MD   BP 150/87 mmHg  Pulse 68  Temp(Src) 97.9 F (36.6 C) (Oral)  Resp 16  Ht 5\' 6"  (1.676 m)  Wt 285 lb (129.275 kg)  BMI 46.02 kg/m2  SpO2 100%  LMP 06/18/2014   Physical Exam  Constitutional: She is oriented to person, place, and time. She appears well-developed and well-nourished.  HENT:  Head: Normocephalic and atraumatic.  Mouth/Throat: Oropharynx is clear and moist.  No visible signs of head trauma  Eyes: Conjunctivae and EOM are normal. Pupils are equal, round, and reactive to light.  Neck: Normal range of motion.  Cardiovascular: Normal rate, regular rhythm and normal heart sounds.   Pulmonary/Chest: Effort normal and breath sounds normal.  Abdominal: Soft. Bowel sounds are normal.  Musculoskeletal: Normal range of motion.  Right shoulder with TTP along posterior and lateral aspect; no swelling or bony deformities noted; no bruising or signs of trauma; limited ROM due to pain; normal grip strength; normal sensation throughout arm; strong radial pulse and cap refill  Neurological: She is alert and oriented to person, place, and time.  AAOx3, answering questions appropriately; equal strength UE and LE bilaterally; CN grossly intact; moves all extremities appropriately without ataxia; no focal neuro deficits or facial asymmetry appreciated  Skin: Skin is warm and dry.  Psychiatric: She has a normal mood and affect.  Nursing note and vitals reviewed.   ED Course  Procedures (including critical care time) Labs Review Labs Reviewed  I-STAT CHEM 8, ED - Abnormal; Notable for the following:    Glucose, Bld 106 (*)    All other components within normal limits    Imaging Review Dg Shoulder Right  07/14/2014   CLINICAL DATA:  [place] in shower, hitting shoulder along edge of bathtub  EXAM: RIGHT SHOULDER - 2+ VIEW  COMPARISON:  None.  FINDINGS: Frontal, Y scapular, and axillary images obtained. No fracture or dislocation. Joint spaces appear  intact. No erosive change.  IMPRESSION: No fracture or dislocation.  No appreciable arthropathy.   Electronically Signed   By: Lowella Grip III M.D.   On: 07/14/2014 11:58     EKG Interpretation None      MDM   Final diagnoses:  Right shoulder pain  Fall, initial encounter   29 year old female with episode of dizziness and fall in the shower. She landed on right shoulder. No head injury or loss of consciousness. Patient has long-standing history of vertigo did not take her meclizine this morning. She is neurologically intact here. She has tenderness of her right posterior and lateral shoulder without bony deformity or signs of dislocation. X-ray was obtained which  is reassuring. Patient was given pain medication and dose of her home meclizine, pain improved and dizziness resolved. She remains neurologically intact. Doubt acute neurologic or cardiac etiology of her dizziness-- more likely from not taking meclizine this morning.  Patient will be discharged home with pain meds, will refill her meclizine.  FU with orthopedics given.  Discussed plan with patient, he/she acknowledged understanding and agreed with plan of care.  Return precautions given for new or worsening symptoms.  Larene Pickett, PA-C 07/14/14 [place], MD 07/14/14 [id number]

## 2014-07-14 NOTE — Discharge Instructions (Signed)
Take the prescribed medication as directed.  Use caution when taking percocet-- it can make you somewhat drowsy. Follow-up with orthopedics if shoulder pain not improving or if you feel it is worsening. Return to the ED for new or worsening symptoms.

## 2014-07-14 NOTE — ED Notes (Signed)
Pt reports was getting into shower when she fell landing on right shoulder causing pain and injury. Pt has history of vertigo and has not taken her medication today.

## 2014-10-25 ENCOUNTER — Encounter (HOSPITAL_COMMUNITY): Payer: Self-pay | Admitting: *Deleted

## 2014-10-25 ENCOUNTER — Emergency Department (HOSPITAL_COMMUNITY)
Admission: EM | Admit: 2014-10-25 | Discharge: 2014-10-25 | Disposition: A | Discharge status: Home / Self Care | Payer: Medicaid Other | Attending: Emergency Medicine | Admitting: Emergency Medicine

## 2014-10-25 DIAGNOSIS — Z8659 Personal history of other mental and behavioral disorders: Secondary | ICD-10-CM | POA: Insufficient documentation

## 2014-10-25 DIAGNOSIS — Z3202 Encounter for pregnancy test, result negative: Secondary | ICD-10-CM | POA: Diagnosis not present

## 2014-10-25 DIAGNOSIS — Z8679 Personal history of other diseases of the circulatory system: Secondary | ICD-10-CM | POA: Diagnosis not present

## 2014-10-25 DIAGNOSIS — R6883 Chills (without fever): Secondary | ICD-10-CM | POA: Insufficient documentation

## 2014-10-25 DIAGNOSIS — Z87440 Personal history of urinary (tract) infections: Secondary | ICD-10-CM | POA: Insufficient documentation

## 2014-10-25 DIAGNOSIS — Z862 Personal history of diseases of the blood and blood-forming organs and certain disorders involving the immune mechanism: Secondary | ICD-10-CM | POA: Diagnosis not present

## 2014-10-25 DIAGNOSIS — R197 Diarrhea, unspecified: Secondary | ICD-10-CM | POA: Insufficient documentation

## 2014-10-25 DIAGNOSIS — R112 Nausea with vomiting, unspecified: Secondary | ICD-10-CM | POA: Insufficient documentation

## 2014-10-25 DIAGNOSIS — Z8619 Personal history of other infectious and parasitic diseases: Secondary | ICD-10-CM | POA: Diagnosis not present

## 2014-10-25 LAB — COMPREHENSIVE METABOLIC PANEL
ALK PHOS: 61 U/L (ref 38–126)
ALT: 26 U/L (ref 14–54)
AST: 23 U/L (ref 15–41)
Albumin: 3.3 g/dL — ABNORMAL LOW (ref 3.5–5.0)
Anion gap: 8 (ref 5–15)
BUN: 7 mg/dL (ref 6–20)
CALCIUM: 8.4 mg/dL — AB (ref 8.9–10.3)
CO2: 25 mmol/L (ref 22–32)
CREATININE: 0.91 mg/dL (ref 0.44–1.00)
Chloride: 104 mmol/L (ref 101–111)
GFR calc non Af Amer: >60 mL/min (ref >60)
GLUCOSE: 121 mg/dL — AB (ref 65–99)
Potassium: 3.8 mmol/L (ref 3.5–5.1)
Sodium: 137 mmol/L (ref 135–145)
Total Bilirubin: 0.5 mg/dL (ref 0.3–1.2)
Total Protein: 6.4 g/dL — ABNORMAL LOW (ref 6.5–8.1)

## 2014-10-25 LAB — URINALYSIS, ROUTINE W REFLEX MICROSCOPIC
Bilirubin Urine: NEGATIVE
GLUCOSE, UA: NEGATIVE mg/dL
Hgb urine dipstick: NEGATIVE
KETONES UR: NEGATIVE mg/dL
Leukocytes, UA: NEGATIVE
Nitrite: NEGATIVE
Protein, ur: NEGATIVE mg/dL
Specific Gravity, Urine: 1.025 (ref 1.005–1.030)
Urobilinogen, UA: 0.2 mg/dL (ref 0.0–1.0)
pH: 5 (ref 5.0–8.0)

## 2014-10-25 LAB — I-STAT BETA HCG BLOOD, ED (MC, WL, AP ONLY): I-stat hCG, quantitative: <5 m[IU]/mL (ref <5)

## 2014-10-25 LAB — CBC
HCT: 44.3 % (ref 36.0–46.0)
Hemoglobin: 14.5 g/dL (ref 12.0–15.0)
MCH: 27.2 pg (ref 26.0–34.0)
MCHC: 32.7 g/dL (ref 30.0–36.0)
MCV: 83.1 fL (ref 78.0–100.0)
PLATELETS: 255 10*3/uL (ref 150–400)
RBC: 5.33 MIL/uL — AB (ref 3.87–5.11)
RDW: 13.7 % (ref 11.5–15.5)
WBC: 13.2 10*3/uL — ABNORMAL HIGH (ref 4.0–10.5)

## 2014-10-25 LAB — LIPASE, BLOOD: Lipase: 28 U/L (ref 22–51)

## 2014-10-25 MED ORDER — ONDANSETRON 4 MG PO TBDP
4.0000 mg | ORAL_TABLET | Freq: Once | ORAL | Status: AC | PRN
  Administered 2014-10-25: 4 mg via ORAL

## 2014-10-25 MED ORDER — ONDANSETRON HCL 4 MG PO TABS: 4.0000 mg | ORAL_TABLET | Freq: Four times a day (QID) | ORAL | Status: DC

## 2014-10-25 MED ORDER — SODIUM CHLORIDE 0.9 % IV SOLN
INTRAVENOUS | Status: DC
  Administered 2014-10-25: 23:00:00 via INTRAVENOUS

## 2014-10-25 MED ORDER — ONDANSETRON 4 MG PO TBDP
ORAL_TABLET | ORAL | Status: AC
  Filled 2014-10-25: qty 1

## 2014-10-25 MED ORDER — SODIUM CHLORIDE 0.9 % IV BOLUS (SEPSIS)
1000.0000 mL | Freq: Once | INTRAVENOUS | Status: DC
Start: 2014-10-25 — End: 2014-10-26

## 2014-10-25 MED ORDER — METOCLOPRAMIDE HCL 5 MG/ML IJ SOLN
10.0000 mg | Freq: Once | INTRAMUSCULAR | Status: AC
  Administered 2014-10-25: 10 mg via INTRAVENOUS
  Filled 2014-10-25: qty 2

## 2014-10-25 NOTE — Discharge Instructions (Signed)

## 2014-10-25 NOTE — ED Provider Notes (Signed)
CSN: [id number]     Arrival date & time 10/25/14  1933 History  This chart was scribed for Martha Baller, NP working with Martha Reichert, MD by Martha Mathews, ED Scribe. This patient was seen in room TR08C/TR08C and the patient's care was started at 10:27 PM.     Chief Complaint  Patient presents with  . Nausea  . Emesis  . Chills   Patient is a 29 y.o. female presenting with vomiting. The history is provided by the patient. No language interpreter was used.  Emesis Severity:  Mild Duration:  6 hours Timing:  Intermittent Number of daily episodes:  5-6 Quality:  Bilious material Progression:  Unchanged Chronicity:  New Relieved by:  None tried Worsened by:  Nothing tried Ineffective treatments:  None tried Associated symptoms: diarrhea   Associated symptoms: no abdominal pain and no headaches   Risk factors: no sick contacts and no suspect food intake    HPI Comments: Martha Mathews is a 29 y.o. female who presents to the Emergency Department complaining of sudden nausea onset today at 4 PM. Pt reports associated vomiting x5-6 and diarrhea x5-6. Pt denies recent sick contacts. Pt denies suspect food intake. Pt doesn't report any medications PTA.  Pt denies abdominal pain, HA or other related symptoms.    Past Medical History  Diagnosis Date  . 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 Bipolar 1 disorder   . 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 Urinary tract infection   . Depression     not currently on meds, being treated- trying to find a med  . Abnormal Pap smear   . Chlamydia 2004  . Anemia     HISTORY W/2006 PREGNANCY  . Dyslexia   . Migraines     otc med prn w/pregnancy   Past Surgical History  Procedure Laterality Date  . Cesarean section    . Myringotomy    . Tonsillectomy    . Induced abortion      vaccum on oct 2009  . Cholecystectomy  2006  . Eye surgery      left eye surgery - lazy eye  . Cesarean section N/A 04/21/2012   Procedure: CESAREAN SECTION;  Surgeon: Cheri Fowler, MD;  Location: [facility];  Service: Obstetrics;  Laterality: N/A;  Repeat  . Bilateral salpingectomy Bilateral 04/21/2012    Procedure: BILATERAL SALPINGECTOMY;  Surgeon: Cheri Fowler, MD;  Location: [facility];  Service: Obstetrics;  Laterality: Bilateral;   Family History  Problem Relation Age of Onset  . Cancer Maternal Grandmother     Breast cancer  . Cancer Paternal Grandmother     Breast cancer  . Stroke Paternal Grandmother 35  . Diabetes Cousin   . Hypertension Cousin   . Other Neg Hx    Social History  Substance Use Topics  . Smoking status: Never Smoker   . Smokeless tobacco: Never Used  . Alcohol Use: No     Comment: socially but none with pregnancy   OB History    Gravida Para Term Preterm AB TAB SAB Ectopic Multiple Living   3 2 2  0 1 1    2      Review of Systems  Gastrointestinal: Positive for nausea, vomiting and diarrhea. Negative for abdominal pain.  Neurological: Negative for headaches.  All other systems reviewed and are negative.    Allergies  Hydrocodone-acetaminophen and Adhesive  Home Medications   Prior to  Admission medications   Not on File   BP 133/79 mmHg  Pulse 109  Temp(Src) 99.8 F (37.7 C) (Oral)  Resp 18  Ht 5\' 6"  (1.676 m)  Wt 283 lb (128.368 kg)  BMI 45.70 kg/m2  SpO2 97%  LMP 10/20/2014 (Exact Date)   Physical Exam  Constitutional: She is oriented to person, place, and time. No distress.  obese  HENT:  Head: Normocephalic and atraumatic.  Mouth/Throat: Uvula is midline. No posterior oropharyngeal edema or posterior oropharyngeal erythema.  Eyes: Conjunctivae and EOM are normal. Pupils are equal, round, and reactive to light. No scleral icterus.  Neck: Normal range of motion. Neck supple. No tracheal deviation present.  Cardiovascular: Normal rate and regular rhythm.   Pulmonary/Chest: Effort normal and breath sounds normal. No respiratory distress.  Abdominal: Soft. Bowel  sounds are normal. There is no tenderness. There is no CVA tenderness.  Musculoskeletal: Normal range of motion.  Neurological: She is alert and oriented to person, place, and time.  Skin: Skin is warm and dry.  Psychiatric: She has a normal mood and affect. Her behavior is normal.  Nursing note and vitals reviewed.   ED Course  Procedures (including critical care time) DIAGNOSTIC STUDIES: Oxygen Saturation is 97% on RA, normal by my interpretation.    COORDINATION OF CARE: 10:36 PM-Discussed treatment plan with pt at bedside and pt agreed to plan.     Labs Review Results for orders placed or performed during the hospital encounter of 10/25/14 (from the past 24 hour(s))  Lipase, blood     Status: None   Collection Time: 10/25/14  8:38 PM  Result Value Ref Range   Lipase 28 22 - 51 U/L  Comprehensive metabolic panel     Status: Abnormal   Collection Time: 10/25/14  8:38 PM  Result Value Ref Range   Sodium 137 135 - 145 mmol/L   Potassium 3.8 3.5 - 5.1 mmol/L   Chloride 104 101 - 111 mmol/L   CO2 25 22 - 32 mmol/L   Glucose, Bld 121 (H) 65 - 99 mg/dL   BUN 7 6 - 20 mg/dL   Creatinine, Ser 0.91 0.44 - 1.00 mg/dL   Calcium 8.4 (L) 8.9 - 10.3 mg/dL   Total Protein 6.4 (L) 6.5 - 8.1 g/dL   Albumin 3.3 (L) 3.5 - 5.0 g/dL   AST 23 15 - 41 U/L   ALT 26 14 - 54 U/L   Alkaline Phosphatase 61 38 - 126 U/L   Total Bilirubin 0.5 0.3 - 1.2 mg/dL   GFR calc non Af Amer >60 >60 mL/min   GFR calc Af Amer >60 >60 mL/min   Anion gap 8 5 - 15  CBC     Status: Abnormal   Collection Time: 10/25/14  8:38 PM  Result Value Ref Range   WBC 13.2 (H) 4.0 - 10.5 K/uL   RBC 5.33 (H) 3.87 - 5.11 MIL/uL   Hemoglobin 14.5 12.0 - 15.0 g/dL   HCT 44.3 36.0 - 46.0 %   MCV 83.1 78.0 - 100.0 fL   MCH 27.2 26.0 - 34.0 pg   MCHC 32.7 30.0 - 36.0 g/dL   RDW 13.7 11.5 - 15.5 %   Platelets 255 150 - 400 K/uL  Urinalysis, Routine w reflex microscopic (not at [facility])     Status: Abnormal   Collection Time:  10/25/14  9:00 PM  Result Value Ref Range   Color, Urine YELLOW YELLOW   APPearance CLOUDY (A) CLEAR  Specific Gravity, Urine 1.025 1.005 - 1.030   pH 5.0 5.0 - 8.0   Glucose, UA NEGATIVE NEGATIVE mg/dL   Hgb urine dipstick NEGATIVE NEGATIVE   Bilirubin Urine NEGATIVE NEGATIVE   Ketones, ur NEGATIVE NEGATIVE mg/dL   Protein, ur NEGATIVE NEGATIVE mg/dL   Urobilinogen, UA 0.2 0.0 - 1.0 mg/dL   Nitrite NEGATIVE NEGATIVE   Leukocytes, UA NEGATIVE NEGATIVE    IV fluids, Zofran and observation.   MDM  29 y.o. female with n/v/d that started earlier today. Care turned over to T. Green, PAC, IV fluids infusing and patient resting comfortably.    I personally performed the services described in this documentation, which was scribed in my presence. The recorded information has been reviewed and is accurate.      [place], NP 10/25/14 [place], MD 10/26/14 [phone]

## 2014-10-25 NOTE — ED Notes (Signed)
Patient left at this time with all belongings. 

## 2014-10-25 NOTE — ED Provider Notes (Signed)
Patient hand off by [facility], NP.  Results for orders placed or performed during the hospital encounter of 10/25/14  Lipase, blood  Result Value Ref Range   Lipase 28 22 - 51 U/L  Comprehensive metabolic panel  Result Value Ref Range   Sodium 137 135 - 145 mmol/L   Potassium 3.8 3.5 - 5.1 mmol/L   Chloride 104 101 - 111 mmol/L   CO2 25 22 - 32 mmol/L   Glucose, Bld 121 (H) 65 - 99 mg/dL   BUN 7 6 - 20 mg/dL   Creatinine, Ser 0.91 0.44 - 1.00 mg/dL   Calcium 8.4 (L) 8.9 - 10.3 mg/dL   Total Protein 6.4 (L) 6.5 - 8.1 g/dL   Albumin 3.3 (L) 3.5 - 5.0 g/dL   AST 23 15 - 41 U/L   ALT 26 14 - 54 U/L   Alkaline Phosphatase 61 38 - 126 U/L   Total Bilirubin 0.5 0.3 - 1.2 mg/dL   GFR calc non Af Amer >60 >60 mL/min   GFR calc Af Amer >60 >60 mL/min   Anion gap 8 5 - 15  CBC  Result Value Ref Range   WBC 13.2 (H) 4.0 - 10.5 K/uL   RBC 5.33 (H) 3.87 - 5.11 MIL/uL   Hemoglobin 14.5 12.0 - 15.0 g/dL   HCT 44.3 36.0 - 46.0 %   MCV 83.1 78.0 - 100.0 fL   MCH 27.2 26.0 - 34.0 pg   MCHC 32.7 30.0 - 36.0 g/dL   RDW 13.7 11.5 - 15.5 %   Platelets 255 150 - 400 K/uL  Urinalysis, Routine w reflex microscopic (not at [facility])  Result Value Ref Range   Color, Urine YELLOW YELLOW   APPearance CLOUDY (A) CLEAR   Specific Gravity, Urine 1.025 1.005 - 1.030   pH 5.0 5.0 - 8.0   Glucose, UA NEGATIVE NEGATIVE mg/dL   Hgb urine dipstick NEGATIVE NEGATIVE   Bilirubin Urine NEGATIVE NEGATIVE   Ketones, ur NEGATIVE NEGATIVE mg/dL   Protein, ur NEGATIVE NEGATIVE mg/dL   Urobilinogen, UA 0.2 0.0 - 1.0 mg/dL   Nitrite NEGATIVE NEGATIVE   Leukocytes, UA NEGATIVE NEGATIVE  I-Stat Beta hCG blood, ED ([facility], [facility], AP only)  Result Value Ref Range   I-stat hCG, quantitative <5.0 <5 mIU/mL   Comment 3           No results found.   Patient feeling much better and fluids. Discussed hydration and Zofran. Referral to GI. Patient has a soft abdomen and has not experienced any flank pain or abdominal  pain.  Medications  sodium chloride 0.9 % bolus 1,000 mL (not administered)  ondansetron (ZOFRAN-ODT) disintegrating tablet 4 mg (4 mg Oral Given 10/25/14 2027)  metoCLOPramide (REGLAN) injection 10 mg (10 mg Intravenous Given 10/25/14 2245)    29 y.o.Martha Mathews's evaluation in the Emergency Department is complete. It has been determined that no acute conditions requiring further emergency intervention are present at this time. The patient/guardian have been advised of the diagnosis and plan. We have discussed signs and symptoms that warrant return to the ED, such as changes or worsening in symptoms.  Vital signs are stable at discharge. Filed Vitals:   10/25/14 2346  BP: 118/80  Pulse: 99  Temp:   Resp: 16    Patient/guardian has voiced understanding and agreed to follow-up with the PCP or specialist.   Delos Haring, PA-C 10/25/14 2343  Delos Haring, PA-C 10/25/14 2348  Merrily Pew, MD 10/26/14 9191

## 2014-10-25 NOTE — ED Notes (Signed)
Pt reports n/v/d and chills for the past four hours. Pt denies any pain.

## 2014-11-14 ENCOUNTER — Emergency Department (HOSPITAL_COMMUNITY): Payer: Medicaid Other

## 2014-11-14 ENCOUNTER — Encounter (HOSPITAL_COMMUNITY): Payer: Self-pay | Admitting: *Deleted

## 2014-11-14 ENCOUNTER — Emergency Department (HOSPITAL_COMMUNITY)
Admission: EM | Admit: 2014-11-14 | Discharge: 2014-11-14 | Disposition: A | Discharge status: Home / Self Care | Payer: Medicaid Other | Attending: Emergency Medicine | Admitting: Emergency Medicine

## 2014-11-14 DIAGNOSIS — J0100 Acute maxillary sinusitis, unspecified: Secondary | ICD-10-CM | POA: Insufficient documentation

## 2014-11-14 DIAGNOSIS — Z87440 Personal history of urinary (tract) infections: Secondary | ICD-10-CM | POA: Diagnosis not present

## 2014-11-14 DIAGNOSIS — R059 Cough, unspecified: Secondary | ICD-10-CM

## 2014-11-14 DIAGNOSIS — Z8659 Personal history of other mental and behavioral disorders: Secondary | ICD-10-CM | POA: Diagnosis not present

## 2014-11-14 DIAGNOSIS — Q0700 Arnold-Chiari syndrome without spina bifida or hydrocephalus: Secondary | ICD-10-CM | POA: Insufficient documentation

## 2014-11-14 DIAGNOSIS — R05 Cough: Secondary | ICD-10-CM | POA: Diagnosis present

## 2014-11-14 DIAGNOSIS — Z8679 Personal history of other diseases of the circulatory system: Secondary | ICD-10-CM | POA: Diagnosis not present

## 2014-11-14 DIAGNOSIS — Z8619 Personal history of other infectious and parasitic diseases: Secondary | ICD-10-CM | POA: Insufficient documentation

## 2014-11-14 DIAGNOSIS — Z862 Personal history of diseases of the blood and blood-forming organs and certain disorders involving the immune mechanism: Secondary | ICD-10-CM | POA: Insufficient documentation

## 2014-11-14 MED ORDER — HYDROCODONE-HOMATROPINE 5-1.5 MG/5ML PO SYRP: 5.0000 mL | ORAL_SOLUTION | Freq: Four times a day (QID) | ORAL | Status: DC | PRN

## 2014-11-14 MED ORDER — HYDROCODONE-HOMATROPINE 5-1.5 MG/5ML PO SYRP
5.0000 mL | ORAL_SOLUTION | Freq: Once | ORAL | Status: AC
  Administered 2014-11-14: 5 mL via ORAL
  Filled 2014-11-14: qty 5

## 2014-11-14 MED ORDER — ALBUTEROL SULFATE HFA 108 (90 BASE) MCG/ACT IN AERS
2.0000 | INHALATION_SPRAY | RESPIRATORY_TRACT | Status: DC
  Administered 2014-11-14: 2 via RESPIRATORY_TRACT
  Filled 2014-11-14: qty 6.7

## 2014-11-14 MED ORDER — AMOXICILLIN-POT CLAVULANATE 875-125 MG PO TABS
1.0000 | ORAL_TABLET | Freq: Once | ORAL | Status: AC
  Administered 2014-11-14: 1 via ORAL
  Filled 2014-11-14: qty 1

## 2014-11-14 MED ORDER — AMOXICILLIN-POT CLAVULANATE 875-125 MG PO TABS: 1.0000 | ORAL_TABLET | Freq: Two times a day (BID) | ORAL | Status: DC

## 2014-11-14 NOTE — Discharge Instructions (Signed)
Sinusitis Follow up with a primary care provider using the resource guide below. Take Augmentin and Hycodan as prescribed. Return for difficulty breathing. Sinusitis is redness, soreness, and puffiness (inflammation) of the air pockets in the bones of your face (sinuses). The redness, soreness, and puffiness can cause air and mucus to get trapped in your sinuses. This can allow germs to grow and cause an infection.  HOME CARE   Drink enough fluids to keep your pee (urine) clear or pale yellow.  Use a humidifier in your home.  Run a hot shower to create steam in the bathroom. Sit in the bathroom with the door closed. Breathe in the steam 3-4 times a day.  Put a warm, moist washcloth on your face 3-4 times a day, or as told by your doctor.  Use salt water sprays (saline sprays) to wet the thick fluid in your nose. This can help the sinuses drain.  Only take medicine as told by your doctor. GET HELP RIGHT AWAY IF:   Your pain gets worse.  You have very bad headaches.  You are sick to your stomach (nauseous).  You throw up (vomit).  You are very sleepy (drowsy) all the time.  Your face is puffy (swollen).  Your vision changes.  You have a stiff neck.  You have trouble breathing. MAKE SURE YOU:   Understand these instructions.  Will watch your condition.  Will get help right away if you are not doing well or get worse. Document Released: 07/23/2007 Document Revised: 10/29/2011 Document Reviewed: 09/09/2011 [facility] Patient Information 2015 [place], [place]. This information is not intended to replace advice given to you by your health care provider. Make sure you discuss any questions you have with your health care provider.  Emergency Department Resource Guide 1) Find a Doctor and Pay Out of Pocket Although you won't have to find out who is covered by your insurance plan, it is a good idea to ask around and get recommendations. You will then need to call the office and see  if the doctor you have chosen will accept you as a new patient and what types of options they offer for patients who are self-pay. Some doctors offer discounts or will set up payment plans for their patients who do not have insurance, but you will need to ask so you aren't surprised when you get to your appointment.  2) Contact Your Local Health Department Not all health departments have doctors that can see patients for sick visits, but many do, so it is worth a call to see if yours does. If you don't know where your local health department is, you can check in your phone book. The CDC also has a tool to help you locate your state's health department, and many state websites also have listings of all of their local health departments.  3) Find a [facility] If your illness is not likely to be very severe or complicated, you may want to try a walk in clinic. These are popping up all over the country in pharmacies, drugstores, and shopping centers. They're usually staffed by nurse practitioners or physician assistants that have been trained to treat common illnesses and complaints. They're usually fairly quick and inexpensive. However, if you have serious medical issues or chronic medical problems, these are probably not your best option.  No Primary Care Doctor: - Call Health Connect at  [phone] - they can help you locate a primary care doctor that  accepts your insurance, provides certain  services, etc. - Physician Referral Service- [phone]  Chronic Pain Problems: Organization         Address  Phone   Notes  [facility]  [phone] Patients need to be referred by their primary care doctor.   Medication Assistance: Organization         Address  Phone   Notes  [facility] Medication [facility] [address] [place], [place] [address] [phone] --Must be a resident of [facility] -- Must have NO insurance coverage whatsoever (no  Medicaid/ Medicare, etc.) -- The pt. MUST have a primary care doctor that directs their care regularly and follows them in the community   MedAssist  [phone]   [employer]  [phone]    Agencies that provide inexpensive medical care: Organization         Address  Phone   Notes  [place]  [phone]   [facility]    [phone]   [facility] [place], [place] [address] [phone]   [place] [address], [place] [phone]   Planned Parenthood    [phone]   [facility]    [phone]   [place] and [place] [address], [place] Phone:  [phone], Fax:  [phone] Hours of Operation:  9 am - 6 pm, M-F.  Also accepts Medicaid/Medicare and self-pay.  [facility] for [place] [address], [address], [place] Phone: [phone], Fax: [phone]. Hours of Operation:  8:30 am - 5:30 pm, M-F.  Also accepts Medicaid and self-pay.  [facility] [place] [address] [place] Phone: [phone]   [place], [place], [place] [phone], Ext. [phone] Mondays & Thursdays: 7-9 AM.  First 15 patients are seen on a first come, first serve basis.    [place] Providers:  Organization         Address  Phone   Notes  [facility] [address], [place] [phone] Also accepts self-pay patients.  [facility] [address], [place]  [phone]   [place], [address], [place] [phone]   [facility] [address], [place] [phone]   Lucianne Lei [address], [place]   [phone] Only accepts [place] Access [place] patients after they have their name applied to their card.    Self-Pay (no insurance) in [facility]:  Organization         Address  Phone   Notes  Sickle Cell Patients, [facility] [place] [phone]   [facility] [place] [phone]   [facility] [place]  [place], [place], [place] [phone]   [facility]/Dr. Osei-Bonsu  [address] [place] or [address], [place] [phone] Phone number for both [place] and [place] locations is the same.  Urgent Medical and [facility] [address] [place] [phone]   [facility] [address] [place] or [address] [phone]   [facility] [address]  [place] [phone], phone; [phone], fax Sees patients 1st and 3rd Saturday of every month.  Must not qualify for public or private insurance (i.e. Medicaid, Medicare, [place] Health Choice, Veterans' Benefits)  Household income should be no more than 200% of the poverty level The clinic cannot treat you if you are pregnant or think you are pregnant  Sexually transmitted diseases are not treated at the clinic.    Dental Care: Organization         Address  Phone  Notes  [facility] Department of [facility] [place] [phone] Accepts children up to age 27 who are enrolled in [place] or [place]; pregnant women with a Medicaid card; and children who have applied for Medicaid or [place] Health Choice, but were declined, whose parents can pay a reduced fee at time of service.  [facility] Department of [facility]  [address], [place] [phone] Accepts children up to age 96 who are enrolled in [place] or [place]; pregnant women with a Medicaid card; and children who have applied for Medicaid or [place] Health Choice,  but were declined, whose parents can pay a reduced fee at time of service.  [place] Adult Dental Access PROGRAM  [place] [phone] Patients are seen by appointment only. Walk-ins are not accepted. [place] will see patients 55 years of age and older. Monday - Tuesday (8am-5pm) Most Wednesdays (8:30-5pm) $30 per visit, cash only  [facility] Adult Dental Access PROGRAM  [address], [place] [facility] [phone] Patients are seen by appointment only. Walk-ins are not accepted. [place] will see patients 13 years of age and older. One Wednesday Evening (Monthly: Volunteer Based).  $30 per visit, cash only  [place]  [phone] for adults; Children under age 46, call Graduate Pediatric Dentistry at [phone]. Children aged 71-14, please call [phone] to request a pediatric application.  Dental services are provided in all areas of dental care including fillings, crowns and bridges, complete and partial dentures, implants, gum treatment, root canals, and extractions. Preventive care is also provided. Treatment is provided to both adults and children. Patients are selected via a lottery and there is often a waiting list.   [facility] [address] [place]  [phone] [email or web address]   [facility] [address] [place], [place] [phone], Ext. [phone] Second and Fourth Thursday of each month, opens at 6:30 AM; Clinic ends at 9 AM.  Patients are seen on a first-come first-served basis, and a limited number are seen during each clinic.   [facility]  [address] Hillard Danker [place], [place] [phone]   Eligibility Requirements You must have lived in [place], [place], or [place] counties for at least the last three months.   You cannot be eligible for state or federal sponsored [employer], including [employer], [place], or [employer].   You generally  cannot be eligible for healthcare insurance through your employer.    How to apply: Eligibility screenings are held every Tuesday and Wednesday afternoon from 1:00 pm until 4:00 pm. You do not need an appointment for the interview!  [facility] [address], [place], [place]   [place]  [place]  [place]  [phone]    Behavioral  Health Resources in the Community: Intensive Outpatient Programs Organization         Address  Phone  Notes  [place]. [address] [place], [place] [phone]   [facility] Outpatient [address], [place], [place]   ADS: Alcohol & Drug Svcs [address], [place], [place]   [place] [address]  [place], [place] or [phone]   Substance Abuse Resources Organization         Address  Phone  Notes  Alcohol and Drug Services  [phone]   [place]  [phone]   [facility] [place]   Chinita Pester  [phone]   Residential & Outpatient Substance Abuse Program  [phone]   Psychological Services Organization         Address  Phone  Notes  [facility]  [place]  [phone]   [place] [address] [place] or [phone]    Mobile Crisis Teams Organization         Address  Phone  Notes  [facility], Mobile Crisis Care Unit  [phone]   Assertive Psychotherapeutic Services  [address] [place], [place]   [place] [address], [place] [phone]    Self-Help/Support Groups Organization         Address  Phone             Notes  [place]. of [place] - variety of support groups  [place] Call for more  information  Narcotics Anonymous (NA), [facility] [address], [employer] [place]  2 meetings at this location   Special educational needs teacher         Address  Phone  Notes  ASAP Residential Treatment [place],    [place]  [phone]   [facility]  [address] [place] [address], [place], [place]   [place], [place] [phone] Admissions: 8am-3pm M-F  Incentives Substance [place] [address]    [place], [place] [phone]   [facility] [address] [place], [place], [place]   [facility] [address]  [place], [place]   [facility]ight Programs - Intensive Outpatient [address]een [address], [place], [place]   [facility] ([place] [address]  [place], [place] [phone] or [phone]   [facility]idential Treatment Services (RTS) [address] [place], [place] Accepts Medicaid  [facility] [place] [address],  [place] [phone] Substance Abuse/Addiction Treatment   [facility] - Closed Organization         Address  Phone  Notes  [facility]  [phone]   Domenic Schwab, PhD [address] Arlis [place], [place]   [phone] or [phone]   [place], [place] [phone]   [place] [address] [place], [place] [phone] Insurance/Medicaid/sponsorship through [employer] and Families [address], [place]                                    [place], [place] [phone] [place] [address]   [place], [place] [phone]    Dr. Adele Schilder  [phone]   [facility] [place] Dept. 1) [address] [place] 2) [place] 3)  [place], [place] [phone]  [phone]   [place] [phone] or [phone] (After Hours)

## 2014-11-14 NOTE — ED Provider Notes (Signed)
CSN: [id number]     Arrival date & time 11/14/14  1056 History   First MD Initiated Contact with Patient 11/14/14 1145     Chief Complaint  Patient presents with  . Cough  . URI     (Consider location/radiation/quality/duration/timing/severity/associated sxs/prior Treatment) Patient is a 29 y.o. female presenting with cough and URI. The history is provided by the patient. No language interpreter was used.  Cough Associated symptoms: no chills and no fever   URI Presenting symptoms: cough   Presenting symptoms: no fever    Ms. Martha Mathews is a 29 year old female who presents with gradual onset productive green and yellow sputum cough for the past 5 days with sinus pressure. She states she has taken TheraFlu and Delsym with minimal relief. She denies any smoking history. She denies any fever, chills, sick contacts, ear pain, sore throat, chest pain, shortness of breath, nausea, vomiting.  Past Medical History  Diagnosis Date  . 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 Bipolar 1 disorder   . 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 Urinary tract infection   . Depression     not currently on meds, being treated- trying to find a med  . Abnormal Pap smear   . Chlamydia 2004  . Anemia     HISTORY W/2006 PREGNANCY  . Dyslexia   . Migraines     otc med prn w/pregnancy   Past Surgical History  Procedure Laterality Date  . Cesarean section    . Myringotomy    . Tonsillectomy    . Induced abortion      vaccum on oct 2009  . Cholecystectomy  2006  . Eye surgery      left eye surgery - lazy eye  . Cesarean section N/A 04/21/2012    Procedure: CESAREAN SECTION;  Surgeon: Cheri Fowler, MD;  Location: [facility];  Service: Obstetrics;  Laterality: N/A;  Repeat  . Bilateral salpingectomy Bilateral 04/21/2012    Procedure: BILATERAL SALPINGECTOMY;  Surgeon: Cheri Fowler, MD;  Location: [facility];  Service: Obstetrics;  Laterality: Bilateral;   Family History  Problem  Relation Age of Onset  . Cancer Maternal Grandmother     Breast cancer  . Cancer Paternal Grandmother     Breast cancer  . Stroke Paternal Grandmother 39  . Diabetes Cousin   . Hypertension Cousin   . Other Neg Hx    Social History  Substance Use Topics  . Smoking status: Never Smoker   . Smokeless tobacco: Never Used  . Alcohol Use: No     Comment: socially but none with pregnancy   OB History    Gravida Para Term Preterm AB TAB SAB Ectopic Multiple Living   3 2 2  0 1 1    2      Review of Systems  Constitutional: Negative for fever and chills.  Respiratory: Positive for cough.       Allergies  Hydrocodone-acetaminophen and Adhesive  Home Medications   Prior to Admission medications   Medication Sig Start Date End Date Taking? Authorizing Provider  dextromethorphan (DELSYM) 30 MG/5ML liquid Take 60 mg by mouth as needed for cough.   Yes Historical Provider, MD  Phenylephrine-Pheniramine-DM [facility] COLD & COUGH PO) Take 1 each by mouth every 8 (eight) hours as needed (cold symptoms).   Yes Historical Provider, MD  amoxicillin-clavulanate (AUGMENTIN) 875-125 MG tablet Take 1 tablet by mouth every 12 (twelve) hours. 11/14/14  Hanna Patel-Mills, PA-C  HYDROcodone-homatropine (HYCODAN) 5-1.5 MG/5ML syrup Take 5 mLs by mouth every 6 (six) hours as needed for cough. 11/14/14   Hanna Patel-Mills, PA-C  ondansetron (ZOFRAN) 4 MG tablet Take 1 tablet (4 mg total) by mouth every 6 (six) hours. Patient not taking: Reported on 11/14/2014 10/25/14   Delos Haring, PA-C   BP 117/81 mmHg  Pulse 93  Temp(Src) 98.1 F (36.7 C) (Oral)  Resp 20  SpO2 96%  LMP 10/20/2014 (Exact Date) Physical Exam  Constitutional: She is oriented to person, place, and time. She appears well-developed and well-nourished.  HENT:  Head: Normocephalic and atraumatic.  No tonsillar exudates or swelling. Uvula is midline and without edema. No hot potato voice or kissing tonsils. No oropharyngeal erythema or  edema. Patent airway with no difficulty breathing.  Maxillary tenderness to palpation.  Eyes: Conjunctivae are normal.  Neck: Normal range of motion. Neck supple.  Cardiovascular: Normal rate, regular rhythm and normal heart sounds.   Pulmonary/Chest: Effort normal. No accessory muscle usage. No respiratory distress. She has decreased breath sounds. She has wheezes.  Wheezing in the right upper lung field and decreased breath sounds in the right lower lung field.  No respiratory distress or use of accessory muscles.  Abdominal: Soft.  Musculoskeletal: Normal range of motion.  Neurological: She is alert and oriented to person, place, and time.  Skin: Skin is warm and dry.  Psychiatric: She has a normal mood and affect.  Nursing note and vitals reviewed.   ED Course  Procedures (including critical care time) Labs Review Labs Reviewed - No data to display  Imaging Review Dg Chest 2 View  11/14/2014   CLINICAL DATA:  Cough, congestion and wheezing x 6 days.  EXAM: CHEST  2 VIEW  COMPARISON:  07/30/2010  FINDINGS: The heart size and mediastinal contours are within normal limits. Both lungs are clear. The visualized skeletal structures are unremarkable.  IMPRESSION: No active cardiopulmonary disease.   Electronically Signed   By: Nolon Nations M.D.   On: 11/14/2014 12:16   I have personally reviewed and evaluated these images results as part of my medical decision-making.   EKG Interpretation None      MDM   Final diagnoses:  Acute maxillary sinusitis, recurrence not specified  Cough  Patient presents for productive cough. She is well-appearing and in no acute respiratory distress. Her vitals are stable. She is 96% on room air. She has no history of asthma or smoking. Chest x-ray is negative for pneumonia. I believe this is most likely sinusitis or an upper respiratory infection. I discussed return precautions with the patient as well as follow-up and she verbally agrees with  the plan. Rx: Hycodan and Augmentin Medications  HYDROcodone-homatropine (HYCODAN) 5-1.5 MG/5ML syrup 5 mL (5 mLs Oral Given 11/14/14 1202)  amoxicillin-clavulanate (AUGMENTIN) 875-125 MG per tablet 1 tablet (1 tablet Oral Given 11/14/14 1230)       Ottie Glazier, PA-C 11/14/14 1813  Sherwood Gambler, MD 11/16/14 1504

## 2014-11-14 NOTE — ED Notes (Signed)
Pt stated "has been going on since Thursday.  The cough syrup is not helping.  Feel like it's up in my head too."  Cough produces "yellowish-green, thick".

## 2015-04-19 ENCOUNTER — Emergency Department (HOSPITAL_COMMUNITY)
Admission: EM | Admit: 2015-04-19 | Discharge: 2015-04-19 | Disposition: A | Discharge status: Home / Self Care | Payer: Medicaid Other | Attending: Emergency Medicine | Admitting: Emergency Medicine

## 2015-04-19 DIAGNOSIS — E669 Obesity, unspecified: Secondary | ICD-10-CM | POA: Insufficient documentation

## 2015-04-19 DIAGNOSIS — G4489 Other headache syndrome: Secondary | ICD-10-CM

## 2015-04-19 DIAGNOSIS — Z3202 Encounter for pregnancy test, result negative: Secondary | ICD-10-CM | POA: Insufficient documentation

## 2015-04-19 DIAGNOSIS — Z862 Personal history of diseases of the blood and blood-forming organs and certain disorders involving the immune mechanism: Secondary | ICD-10-CM | POA: Insufficient documentation

## 2015-04-19 DIAGNOSIS — R42 Dizziness and giddiness: Secondary | ICD-10-CM

## 2015-04-19 DIAGNOSIS — Z8619 Personal history of other infectious and parasitic diseases: Secondary | ICD-10-CM | POA: Insufficient documentation

## 2015-04-19 DIAGNOSIS — R600 Localized edema: Secondary | ICD-10-CM | POA: Insufficient documentation

## 2015-04-19 DIAGNOSIS — Z87440 Personal history of urinary (tract) infections: Secondary | ICD-10-CM | POA: Insufficient documentation

## 2015-04-19 DIAGNOSIS — Z8659 Personal history of other mental and behavioral disorders: Secondary | ICD-10-CM | POA: Insufficient documentation

## 2015-04-19 DIAGNOSIS — Q0700 Arnold-Chiari syndrome without spina bifida or hydrocephalus: Secondary | ICD-10-CM | POA: Insufficient documentation

## 2015-04-19 LAB — I-STAT CHEM 8, ED
BUN: 6 mg/dL (ref 6–20)
CALCIUM ION: 1.16 mmol/L (ref 1.12–1.23)
Chloride: 100 mmol/L — ABNORMAL LOW (ref 101–111)
Creatinine, Ser: 0.7 mg/dL (ref 0.44–1.00)
Glucose, Bld: 83 mg/dL (ref 65–99)
HEMATOCRIT: 36 % (ref 36.0–46.0)
HEMOGLOBIN: 12.2 g/dL (ref 12.0–15.0)
Potassium: 4.1 mmol/L (ref 3.5–5.1)
SODIUM: 139 mmol/L (ref 135–145)
TCO2: 27 mmol/L (ref 0–100)

## 2015-04-19 LAB — POC URINE PREG, ED: PREG TEST UR: NEGATIVE

## 2015-04-19 MED ORDER — MECLIZINE HCL 25 MG PO TABS: 25.0000 mg | ORAL_TABLET | Freq: Three times a day (TID) | ORAL | Status: AC | PRN

## 2015-04-19 MED ORDER — MECLIZINE HCL 25 MG PO TABS
25.0000 mg | ORAL_TABLET | Freq: Once | ORAL | Status: AC
  Administered 2015-04-19: 25 mg via ORAL
  Filled 2015-04-19: qty 1

## 2015-04-19 MED ORDER — IBUPROFEN 400 MG PO TABS
600.0000 mg | ORAL_TABLET | Freq: Once | ORAL | Status: AC
  Administered 2015-04-19: 600 mg via ORAL
  Filled 2015-04-19: qty 1

## 2015-04-19 NOTE — ED Provider Notes (Signed)
CSN: [id number]     Arrival date & time 04/19/15  1236 History   First MD Initiated Contact with Patient 04/19/15 1501     Chief Complaint  Patient presents with  . Dizziness     (Consider location/radiation/quality/duration/timing/severity/associated sxs/prior Treatment) Patient is a 30 y.o. female presenting with dizziness and general illness. The history is provided by the patient.  Dizziness Quality:  Room spinning and head spinning Severity:  Moderate Duration:  4 hours Timing:  Intermittent Progression:  Waxing and waning Chronicity:  Recurrent Context: head movement and standing up   Relieved by:  Being still Worsened by:  Standing up and turning head Associated symptoms: headaches (mild right sided headache; similar to prior headaches)   Associated symptoms: no blood in stool, no chest pain, no diarrhea, no nausea, no palpitations, no shortness of breath, no syncope, no tinnitus, no vomiting and no weakness   Risk factors: no new medications   Illness Location:  BLE Quality:  Swelling Severity:  Moderate Onset quality:  Gradual Duration:  2 days Timing:  Constant Progression:  Worsening Chronicity:  New Associated symptoms: headaches (mild right sided headache; similar to prior headaches)   Associated symptoms: no abdominal pain, no chest pain, no congestion, no cough, no diarrhea, no ear pain, no fatigue, no fever, no nausea, no rash, no rhinorrhea, no shortness of breath, no sore throat and no vomiting   Headaches:    Severity:  Mild   Onset quality:  Gradual   Duration:  1 day   Timing:  Constant   Progression:  Unchanged   Chronicity:  Recurrent   Past Medical History  Diagnosis Date  . 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 Bipolar 1 disorder   . 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 Urinary tract infection   . Depression     not currently on meds, being treated- trying to find a med  . Abnormal Pap smear   . Chlamydia  2004  . Anemia     HISTORY W/2006 PREGNANCY  . Dyslexia   . Migraines     otc med prn w/pregnancy   Past Surgical History  Procedure Laterality Date  . Cesarean section    . Myringotomy    . Tonsillectomy    . Induced abortion      vaccum on oct 2009  . Cholecystectomy  2006  . Eye surgery      left eye surgery - lazy eye  . Cesarean section N/A 04/21/2012    Procedure: CESAREAN SECTION;  Surgeon: Cheri Fowler, MD;  Location: [facility];  Service: Obstetrics;  Laterality: N/A;  Repeat  . Bilateral salpingectomy Bilateral 04/21/2012    Procedure: BILATERAL SALPINGECTOMY;  Surgeon: Cheri Fowler, MD;  Location: [facility];  Service: Obstetrics;  Laterality: Bilateral;   Family History  Problem Relation Age of Onset  . Cancer Maternal Grandmother     Breast cancer  . Cancer Paternal Grandmother     Breast cancer  . Stroke Paternal Grandmother 82  . Diabetes Cousin   . Hypertension Cousin   . Other Neg Hx    Social History  Substance Use Topics  . Smoking status: Never Smoker   . Smokeless tobacco: Never Used  . Alcohol Use: No     Comment: socially but none with pregnancy   OB History    Gravida Para Term Preterm AB TAB SAB Ectopic Multiple Living   3 2  2 0 1 1    2      Review of Systems  Constitutional: Negative for fever, chills, appetite change and fatigue.  HENT: Negative for congestion, ear pain, facial swelling, mouth sores, rhinorrhea, sore throat and tinnitus.   Eyes: Negative for visual disturbance.  Respiratory: Negative for cough, chest tightness and shortness of breath.   Cardiovascular: Negative for chest pain, palpitations and syncope.  Gastrointestinal: Negative for nausea, vomiting, abdominal pain, diarrhea and blood in stool.  Endocrine: Negative for cold intolerance and heat intolerance.  Genitourinary: Negative for frequency, decreased urine volume and difficulty urinating.  Musculoskeletal: Negative for back pain and neck stiffness.  Skin: Negative for  rash.  Neurological: Positive for dizziness and headaches (mild right sided headache; similar to prior headaches). Negative for weakness and light-headedness.  All other systems reviewed and are negative.     Allergies  Hydrocodone-acetaminophen and Adhesive  Home Medications   Prior to Admission medications   Medication Sig Start Date End Date Taking? Authorizing Provider  acetaminophen (TYLENOL) 325 MG tablet Take 650 mg by mouth every 6 (six) hours as needed for mild pain.   Yes Historical Provider, MD  amoxicillin-clavulanate (AUGMENTIN) 875-125 MG tablet Take 1 tablet by mouth every 12 (twelve) hours. Patient not taking: Reported on 04/19/2015 11/14/14   Ottie Glazier, PA-C  HYDROcodone-homatropine (HYCODAN) 5-1.5 MG/5ML syrup Take 5 mLs by mouth every 6 (six) hours as needed for cough. Patient not taking: Reported on 04/19/2015 11/14/14   Ottie Glazier, PA-C  ondansetron (ZOFRAN) 4 MG tablet Take 1 tablet (4 mg total) by mouth every 6 (six) hours. Patient not taking: Reported on 11/14/2014 10/25/14   Delos Haring, PA-C   BP 158/89 mmHg  Pulse 66  Temp(Src) 98 F (36.7 C) (Oral)  Resp 16  Ht 5\' 6"  (1.676 m)  Wt 129.275 kg  BMI 46.02 kg/m2  SpO2 97%  LMP 03/21/2015 Physical Exam  Constitutional: She is oriented to person, place, and time. She appears well-developed and well-nourished. No distress.  obese  HENT:  Head: Normocephalic and atraumatic.  Right Ear: External ear normal.  Left Ear: External ear normal.  Nose: Nose normal.  Eyes: Conjunctivae and EOM are normal. Pupils are equal, round, and reactive to light. Right eye exhibits no discharge. Left eye exhibits no discharge. No scleral icterus.  Neck: Normal range of motion. Neck supple.  Cardiovascular: Normal rate, regular rhythm and normal heart sounds.  Exam reveals no gallop and no friction rub.   No murmur heard. Pulmonary/Chest: Effort normal and breath sounds normal. No stridor. No respiratory distress.  She has no wheezes.  Abdominal: Soft. She exhibits no distension. There is no tenderness.  Musculoskeletal: She exhibits no edema or tenderness.  1+ BLE pitting edema  Neurological: She is alert and oriented to person, place, and time. Tremors: .pcneur.  Cranial Nerves  II Visual Fields: Intact to confrontation. Visual fields intact. III, IV, VI: Pupils equal and reactive to light and near. Full eye movement without nystagmus  V Facial Sensation: Normal. No weakness of masticatory muscles  VII: No facial weakness or asymmetry  VIII Auditory Acuity: Grossly normal  IX/X: The uvula is midline; the palate elevates symmetrically  XI: Normal sternocleidomastoid and trapezius strength  XII: The tongue is midline. No atrophy or fasciculations.   Motor System: Muscle Strength: 5/5 and symmetric in the upper and lower extremities. No pronation or drift.  Muscle Tone: Tone and muscle bulk are normal in the upper and lower extremities.  Reflexes: DTRs: 1+ and symmetrical in all four extremities. Plantar responses are flexor bilaterally.  Coordination: Intact finger-to-nose, heel-to-shin, and rapid alternating movements. No tremor.  Sensation: Intact to light touch. Gait: Routine and tandem gait are normal    Skin: Skin is warm and dry. No rash noted. She is not diaphoretic. No erythema.  Psychiatric: She has a normal mood and affect.    ED Course  Procedures (including critical care time) Labs Review Labs Reviewed  I-STAT CHEM 8, ED - Abnormal; Notable for the following:    Chloride 100 (*)    All other components within normal limits  POC URINE PREG, ED    Imaging Review No results found. I have personally reviewed and evaluated these images and lab results as part of my medical decision-making.   EKG Interpretation None      MDM   30 year old female with a history of migraine headaches, bipolar disease, Chiari malformation presents to the ED with 1. Headaches/dizziness similar  to her prior headaches and 2. Bilateral lower extremity edema.  1. Headaches/dizziness. This is similar to patient's prior headaches. She describes this headache is mild and right-sided not migrainous yet. Dizziness is associated with posture and head movement. She reports that she's had this multiple times in the past usually associated with headaches. She denies any recent fevers illnesses or infections. No focal deficits on exam. Right middle ear clear effusion. Headache etiology unlikely subarachnoid hemorrhage, intraparenchymal hemorrhage, I I H. UPT negative. Labs without renal insufficiency or anemia. Dizziness either BPPV versus labyrinthitis. Patient provided with Motrin and meclizine.  2. Lower extremity edema Symmetric. Patient denies any shortness of breath, chest pain, prior DVTs. No evidence of cellulitis on exam. Labs without renal insufficiency. Doubt DVT. UPT negative. Patient needs to establish care with a primary care provider in follow-up for further workup if symptoms do not resolve. She is clear for discharge with strict return precautions. Patient follow-up as above.  Diagnostic studies interpreted by me and use to my clinical decision-making. Next  Patient seen in conjunction with Dr. Dayna Barker    Final diagnoses:  Other headache syndrome  Dizziness  Bilateral lower extremity edema        Addison Lank, MD 04/19/15 [place], MD 04/19/15 2028

## 2015-04-19 NOTE — ED Notes (Addendum)
Pt c/o weakness, dizziness, with R leg swelling. +1 pitting edema R leg

## 2015-04-19 NOTE — ED Provider Notes (Signed)
I saw and evaluated the patient, reviewed the resident's note and I agree with the findings and plan.  Here with vertigo and leg swelling. No sob, DOE, cough, fever or chest pain. Exam relatively benign aside from right middle ear effusion (not purulent). Neuro exam intact. Suspect vertigo from labrynthitis. Leg swelling from venous insufficiencty, doubt cardiac or dvt so will continue to follow with PCP.    Merrily Pew, MD 04/23/15 [phone]

## 2015-04-19 NOTE — Discharge Instructions (Signed)
Benign Positional Vertigo °Vertigo is the feeling that you or your surroundings are moving when they are not. Benign positional vertigo is the most common form of vertigo. The cause of this condition is not serious (is benign). This condition is triggered by certain movements and positions (is positional). This condition can be dangerous if it occurs while you are doing something that could endanger you or others, such as driving.  °CAUSES °In many cases, the cause of this condition is not known. It may be caused by a disturbance in an area of the inner ear that helps your brain to sense movement and balance. This disturbance can be caused by a viral infection (labyrinthitis), head injury, or repetitive motion. °RISK FACTORS °This condition is more likely to develop in: °· Women. °· People who are 50 years of age or older. °SYMPTOMS °Symptoms of this condition usually happen when you move your head or your eyes in different directions. Symptoms may start suddenly, and they usually last for less than a minute. Symptoms may include: °· Loss of balance and falling. °· Feeling like you are spinning or moving. °· Feeling like your surroundings are spinning or moving. °· Nausea and vomiting. °· Blurred vision. °· Dizziness. °· Involuntary eye movement (nystagmus). °Symptoms can be mild and cause only slight annoyance, or they can be severe and interfere with daily life. Episodes of benign positional vertigo may return (recur) over time, and they may be triggered by certain movements. Symptoms may improve over time. °DIAGNOSIS °This condition is usually diagnosed by medical history and a physical exam of the head, neck, and ears. You may be referred to a health care provider who specializes in ear, nose, and throat (ENT) problems (otolaryngologist) or a provider who specializes in disorders of the nervous system (neurologist). You may have additional testing, including: °· MRI. °· A CT scan. °· Eye movement tests. Your  health care provider may ask you to change positions quickly while he or she watches you for symptoms of benign positional vertigo, such as nystagmus. Eye movement may be tested with an electronystagmogram (ENG), caloric stimulation, the Dix-Hallpike test, or the roll test. °· An electroencephalogram (EEG). This records electrical activity in your brain. °· Hearing tests. °TREATMENT °Usually, your health care provider will treat this by moving your head in specific positions to adjust your inner ear back to normal. Surgery may be needed in severe cases, but this is rare. In some cases, benign positional vertigo may resolve on its own in 2-4 weeks. °HOME CARE INSTRUCTIONS °Safety °· Move slowly. Avoid sudden body or head movements. °· Avoid driving. °· Avoid operating heavy machinery. °· Avoid doing any tasks that would be dangerous to you or others if a vertigo episode would occur. °· If you have trouble walking or keeping your balance, try using a cane for stability. If you feel dizzy or unstable, sit down right away. °· Return to your normal activities as told by your health care provider. Ask your health care provider what activities are safe for you. °General Instructions °· Take over-the-counter and prescription medicines only as told by your health care provider. °· Avoid certain positions or movements as told by your health care provider. °· Drink enough fluid to keep your urine clear or pale yellow. °· Keep all follow-up visits as told by your health care provider. This is important. °SEEK MEDICAL CARE IF: °· You have a fever. °· Your condition gets worse or you develop new symptoms. °· Your family or friends   notice any behavioral changes.  Your nausea or vomiting gets worse.  You have numbness or a "pins and needles" sensation. SEEK IMMEDIATE MEDICAL CARE IF:  You have difficulty speaking or moving.  You are always dizzy.  You faint.  You develop severe headaches.  You have weakness in your  legs or arms.  You have changes in your hearing or vision.  You develop a stiff neck.  You develop sensitivity to light.   This information is not intended to replace advice given to you by your health care provider. Make sure you discuss any questions you have with your health care provider.   Document Released: 11/11/2005 Document Revised: 10/25/2014 Document Reviewed: 05/29/2014 Elsevier Interactive Patient Education 2016 [employer]. [employer] Self-Care WHAT IS THE EPLEY MANEUVER? The Epley maneuver is an exercise you can do to relieve symptoms of benign paroxysmal positional vertigo (BPPV). This condition is often just referred to as vertigo. BPPV is caused by the movement of tiny crystals (canaliths) inside your inner ear. The accumulation and movement of canaliths in your inner ear causes a sudden spinning sensation (vertigo) when you move your head to certain positions. Vertigo usually lasts about 30 seconds. BPPV usually occurs in just one ear. If you get vertigo when you lie on your left side, you probably have BPPV in your left ear. Your health care provider can tell you which ear is involved.  BPPV may be caused by a head injury. Many people older than 50 get BPPV for unknown reasons. If you have been diagnosed with BPPV, your health care provider may teach you how to do this maneuver. BPPV is not life threatening (benign) and usually goes away in time.  WHEN SHOULD I PERFORM THE EPLEY MANEUVER? You can do this maneuver at home whenever you have symptoms of vertigo. You may do the Epley maneuver up to 3 times a day until your symptoms of vertigo go away. HOW SHOULD I DO THE EPLEY MANEUVER?  Sit on the edge of a bed or table with your back straight. Your legs should be extended or hanging over the edge of the bed or table.   Turn your head halfway toward the affected ear.   Lie backward quickly with your head turned until you are lying flat on your back. You may want to  position a pillow under your shoulders.   Hold this position for 30 seconds. You may experience an attack of vertigo. This is normal. Hold this position until the vertigo stops.  Then turn your head to the opposite direction until your unaffected ear is facing the floor.   Hold this position for 30 seconds. You may experience an attack of vertigo. This is normal. Hold this position until the vertigo stops.  Now turn your whole body to the same side as your head. Hold for another 30 seconds.   You can then sit back up. ARE THERE RISKS TO THIS MANEUVER? In some cases, you may have other symptoms (such as changes in your vision, weakness, or numbness). If you have these symptoms, stop doing the maneuver and call your health care provider. Even if doing these maneuvers relieves your vertigo, you may still have dizziness. Dizziness is the sensation of light-headedness but without the sensation of movement. Even though the Epley maneuver may relieve your vertigo, it is possible that your symptoms will return within 5 years. WHAT SHOULD I DO AFTER THIS MANEUVER? After doing the Epley maneuver, you can return to your normal  activities. Ask your doctor if there is anything you should do at home to prevent vertigo. This may include:  Sleeping with two or more pillows to keep your head elevated.  Not sleeping on the side of your affected ear.  Getting up slowly from bed.  Avoiding sudden movements during the day.  Avoiding extreme head movement, like looking up or bending over.  Wearing a cervical collar to prevent sudden head movements. WHAT SHOULD I DO IF MY SYMPTOMS GET WORSE? Call your health care provider if your vertigo gets worse. Call your provider right way if you have other symptoms, including:   Nausea.  Vomiting.  Headache.  Weakness.  Numbness.  Vision changes.   This information is not intended to replace advice given to you by your health care provider. Make sure you  discuss any questions you have with your health care provider.   Document Released: 02/08/2013 Document Reviewed: 02/08/2013 Elsevier Interactive Patient Education 2016 [employer] Inc[employer]   Edema Edema is an abnormal buildup of fluids. It is more common in your legs and thighs. Painless swelling of the feet and ankles is more likely as a person ages. It also is common in looser skin, like around your eyes. HOME CARE   Keep the affected body part above the level of the heart while lying down.  Do not sit still or stand for a long time.  Do not put anything right under your knees when you lie down.  Do not wear tight clothes on your upper legs.  Exercise your legs to help the puffiness (swelling) go down.  Wear elastic bandages or support stockings as told by your doctor.  A low-salt diet may help lessen the puffiness.  Only take medicine as told by your doctor. GET HELP IF:  Treatment is not working.  You have heart, liver, or kidney disease and notice that your skin looks puffy or shiny.  You have puffiness in your legs that does not get better when you raise your legs.  You have sudden weight gain for no reason. GET HELP RIGHT AWAY IF:   You have shortness of breath or chest pain.  You cannot breathe when you lie down.  You have pain, redness, or warmth in the areas that are puffy.  You have heart, liver, or kidney disease and get edema all of a sudden.  You have a fever and your symptoms get worse all of a sudden. MAKE SURE YOU:   Understand these instructions.  Will watch your condition.  Will get help right away if you are not doing well or get worse.   This information is not intended to replace advice given to you by your health care provider. Make sure you discuss any questions you have with your health care provider.   Document Released: 07/23/2007 Document Revised: 02/08/2013 Document Reviewed: 11/26/2012 Elsevier Interactive Patient Education [employer]

## 2015-05-02 ENCOUNTER — Emergency Department (HOSPITAL_COMMUNITY)
Admission: EM | Admit: 2015-05-02 | Discharge: 2015-05-03 | Disposition: A | Discharge status: Home / Self Care | Payer: Medicaid Other | Attending: Emergency Medicine | Admitting: Emergency Medicine

## 2015-05-02 ENCOUNTER — Encounter (HOSPITAL_COMMUNITY): Payer: Self-pay

## 2015-05-02 DIAGNOSIS — H538 Other visual disturbances: Secondary | ICD-10-CM | POA: Insufficient documentation

## 2015-05-02 DIAGNOSIS — R42 Dizziness and giddiness: Secondary | ICD-10-CM | POA: Insufficient documentation

## 2015-05-02 DIAGNOSIS — R51 Headache: Secondary | ICD-10-CM | POA: Insufficient documentation

## 2015-05-02 DIAGNOSIS — Z862 Personal history of diseases of the blood and blood-forming organs and certain disorders involving the immune mechanism: Secondary | ICD-10-CM | POA: Insufficient documentation

## 2015-05-02 DIAGNOSIS — Z8659 Personal history of other mental and behavioral disorders: Secondary | ICD-10-CM | POA: Insufficient documentation

## 2015-05-02 DIAGNOSIS — Z87440 Personal history of urinary (tract) infections: Secondary | ICD-10-CM | POA: Insufficient documentation

## 2015-05-02 DIAGNOSIS — Z8619 Personal history of other infectious and parasitic diseases: Secondary | ICD-10-CM | POA: Insufficient documentation

## 2015-05-02 DIAGNOSIS — Z8669 Personal history of other diseases of the nervous system and sense organs: Secondary | ICD-10-CM | POA: Insufficient documentation

## 2015-05-02 DIAGNOSIS — Q0700 Arnold-Chiari syndrome without spina bifida or hydrocephalus: Secondary | ICD-10-CM | POA: Insufficient documentation

## 2015-05-02 DIAGNOSIS — H53149 Visual discomfort, unspecified: Secondary | ICD-10-CM | POA: Insufficient documentation

## 2015-05-02 DIAGNOSIS — R112 Nausea with vomiting, unspecified: Secondary | ICD-10-CM | POA: Insufficient documentation

## 2015-05-02 DIAGNOSIS — R519 Headache, unspecified: Secondary | ICD-10-CM

## 2015-05-02 NOTE — ED Notes (Signed)
Pt complains of a migraine for two days, no relief with ibuprofen, she now complains of blurred vision

## 2015-05-03 MED ORDER — KETOROLAC TROMETHAMINE 30 MG/ML IJ SOLN
30.0000 mg | Freq: Once | INTRAMUSCULAR | Status: AC
  Administered 2015-05-03: 30 mg via INTRAVENOUS
  Filled 2015-05-03: qty 1

## 2015-05-03 MED ORDER — PROCHLORPERAZINE EDISYLATE 5 MG/ML IJ SOLN
10.0000 mg | Freq: Four times a day (QID) | INTRAMUSCULAR | Status: DC | PRN
  Administered 2015-05-03: 10 mg via INTRAVENOUS
  Filled 2015-05-03: qty 2

## 2015-05-03 MED ORDER — SODIUM CHLORIDE 0.9 % IV BOLUS (SEPSIS)
1000.0000 mL | Freq: Once | INTRAVENOUS | Status: AC
  Administered 2015-05-03: 1000 mL via INTRAVENOUS

## 2015-05-03 NOTE — ED Notes (Signed)
Discharge instructions and follow up care reviewed with patient. Patient verbalized understanding. 

## 2015-05-03 NOTE — Discharge Instructions (Signed)
Excedrin Migraine for your headache. Please follow with a primary care doctor for recheck. Return if worsening symptoms  Migraine Headache A migraine headache is an intense, throbbing pain on one or both sides of your head. A migraine can last for 30 minutes to several hours. CAUSES  The exact cause of a migraine headache is not always known. However, a migraine may be caused when nerves in the brain become irritated and release chemicals that cause inflammation. This causes pain. Certain things may also trigger migraines, such as:  Alcohol.  Smoking.  Stress.  Menstruation.  Aged cheeses.  Foods or drinks that contain nitrates, glutamate, aspartame, or tyramine.  Lack of sleep.  Chocolate.  Caffeine.  Hunger.  Physical exertion.  Fatigue.  Medicines used to treat chest pain (nitroglycerine), birth control pills, estrogen, and some blood pressure medicines. SIGNS AND SYMPTOMS  Pain on one or both sides of your head.  Pulsating or throbbing pain.  Severe pain that prevents daily activities.  Pain that is aggravated by any physical activity.  Nausea, vomiting, or both.  Dizziness.  Pain with exposure to bright lights, loud noises, or activity.  General sensitivity to bright lights, loud noises, or smells. Before you get a migraine, you may get warning signs that a migraine is coming (aura). An aura may include:  Seeing flashing lights.  Seeing bright spots, halos, or zigzag lines.  Having tunnel vision or blurred vision.  Having feelings of numbness or tingling.  Having trouble talking.  Having muscle weakness. DIAGNOSIS  A migraine headache is often diagnosed based on:  Symptoms.  Physical exam.  A CT scan or MRI of your head. These imaging tests cannot diagnose migraines, but they can help rule out other causes of headaches. TREATMENT Medicines may be given for pain and nausea. Medicines can also be given to help prevent recurrent migraines.    HOME CARE INSTRUCTIONS  Only take over-the-counter or prescription medicines for pain or discomfort as directed by your health care provider. The use of long-term narcotics is not recommended.  Lie down in a dark, quiet room when you have a migraine.  Keep a journal to find out what may trigger your migraine headaches. For example, write down:  What you eat and drink.  How much sleep you get.  Any change to your diet or medicines.  Limit alcohol consumption.  Quit smoking if you smoke.  Get 7-9 hours of sleep, or as recommended by your health care provider.  Limit stress.  Keep lights dim if bright lights bother you and make your migraines worse. SEEK IMMEDIATE MEDICAL CARE IF:   Your migraine becomes severe.  You have a fever.  You have a stiff neck.  You have vision loss.  You have muscular weakness or loss of muscle control.  You start losing your balance or have trouble walking.  You feel faint or pass out.  You have severe symptoms that are different from your first symptoms. MAKE SURE YOU:   Understand these instructions.  Will watch your condition.  Will get help right away if you are not doing well or get worse.   This information is not intended to replace advice given to you by your health care provider. Make sure you discuss any questions you have with your health care provider.   Document Released: 02/03/2005 Document Revised: 02/24/2014 Document Reviewed: 10/11/2012 Elsevier Interactive Patient Education [employer].

## 2015-05-03 NOTE — ED Provider Notes (Signed)
CSN: [id number]     Arrival date & time 05/02/15  2305 History   First MD Initiated Contact with Patient 05/03/15 0107     Chief Complaint  Patient presents with  . Migraine     (Consider location/radiation/quality/duration/timing/severity/associated sxs/prior Treatment) HPI Martha Mathews is a 30 y.o. female history of migraines and Chiari malformation type I, presents to emergency department complaining of a headache. Patient states headache has been on and off for the last 3 days. She reports pain on top of her head and radiates to the back of the head at times. She reports associated nausea and vomiting. She reports dizziness and photosensitivity. She has been taking ibuprofen 600 mg which is not helping. She reports some blurred vision. She denies any numbness or tingling in her extremities. No difficulty walking. No difficulty with speech or memory. She denies any fever, neck pain or stiffness. She denies any head injuries. She states this headache is similar to prior migraines. Patient used to follow by Dr. Gaynell Face with neurology, however states she currently does not have insurance and does not have a primary care doctor or neurologist.  Past Medical History  Diagnosis Date  . 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 Bipolar 1 disorder ([place])   . 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 Urinary tract infection   . Depression     not currently on meds, being treated- trying to find a med  . Abnormal Pap smear   . Chlamydia 2004  . Anemia     HISTORY W/2006 PREGNANCY  . Dyslexia   . Migraines     otc med prn w/pregnancy   Past Surgical History  Procedure Laterality Date  . Cesarean section    . Myringotomy    . Tonsillectomy    . Induced abortion      vaccum on oct 2009  . Cholecystectomy  2006  . Eye surgery      left eye surgery - lazy eye  . Cesarean section N/A 04/21/2012    Procedure: CESAREAN SECTION;  Surgeon: Cheri Fowler, MD;   Location: [facility];  Service: Obstetrics;  Laterality: N/A;  Repeat  . Bilateral salpingectomy Bilateral 04/21/2012    Procedure: BILATERAL SALPINGECTOMY;  Surgeon: Cheri Fowler, MD;  Location: [facility];  Service: Obstetrics;  Laterality: Bilateral;   Family History  Problem Relation Age of Onset  . Cancer Maternal Grandmother     Breast cancer  . Cancer Paternal Grandmother     Breast cancer  . Stroke Paternal Grandmother 2  . Diabetes Cousin   . Hypertension Cousin   . Other Neg Hx    Social History  Substance Use Topics  . Smoking status: Never Smoker   . Smokeless tobacco: Never Used  . Alcohol Use: No     Comment: socially but none with pregnancy   OB History    Gravida Para Term Preterm AB TAB SAB Ectopic Multiple Living   3 2 2  0 1 1    2      Review of Systems  Constitutional: Negative for fever and chills.  HENT: Negative for congestion.   Eyes: Positive for photophobia and visual disturbance. Negative for pain.  Respiratory: Negative for cough, chest tightness and shortness of breath.   Cardiovascular: Negative for chest pain, palpitations and leg swelling.  Gastrointestinal: Positive for nausea and vomiting. Negative for abdominal pain and diarrhea.  Genitourinary: Negative for dysuria, flank pain,  vaginal bleeding, vaginal discharge, vaginal pain and pelvic pain.  Musculoskeletal: Negative for myalgias, arthralgias, neck pain and neck stiffness.  Skin: Negative for rash.  Neurological: Positive for dizziness, light-headedness and headaches. Negative for weakness.  All other systems reviewed and are negative.     Allergies  Hydrocodone-acetaminophen and Adhesive  Home Medications   Prior to Admission medications   Medication Sig Start Date End Date Taking? Authorizing Provider  acetaminophen (TYLENOL) 325 MG tablet Take 650 mg by mouth every 6 (six) hours as needed for mild pain.   Yes Historical Provider, MD  ibuprofen (ADVIL,MOTRIN) 200 MG tablet Take 600  mg by mouth every 6 (six) hours as needed for moderate pain.   Yes Historical Provider, MD  amoxicillin-clavulanate (AUGMENTIN) 875-125 MG tablet Take 1 tablet by mouth every 12 (twelve) hours. Patient not taking: Reported on 04/19/2015 11/14/14   Ottie Glazier, PA-C  HYDROcodone-homatropine (HYCODAN) 5-1.5 MG/5ML syrup Take 5 mLs by mouth every 6 (six) hours as needed for cough. Patient not taking: Reported on 04/19/2015 11/14/14   Ottie Glazier, PA-C  ondansetron (ZOFRAN) 4 MG tablet Take 1 tablet (4 mg total) by mouth every 6 (six) hours. Patient not taking: Reported on 11/14/2014 10/25/14   Delos Haring, PA-C   BP 142/90 mmHg  Pulse 65  Temp(Src) 97.6 F (36.4 C) (Oral)  Resp 20  Ht 5\' 6"  (1.676 m)  Wt 129.275 kg  BMI 46.02 kg/m2  SpO2 100%  LMP 04/18/2015 Physical Exam  Constitutional: She is oriented to person, place, and time. She appears well-developed and well-nourished.  Patient is tearful  HENT:  Head: Normocephalic.  Eyes: Conjunctivae and EOM are normal. Pupils are equal, round, and reactive to light.  Neck: Normal range of motion. Neck supple.  Cardiovascular: Normal rate, regular rhythm and normal heart sounds.   Pulmonary/Chest: Effort normal and breath sounds normal. No respiratory distress. She has no wheezes. She has no rales.  Abdominal: Soft. Bowel sounds are normal. She exhibits no distension. There is no tenderness. There is no rebound.  Musculoskeletal: She exhibits no edema.  Neurological: She is alert and oriented to person, place, and time.  5/5 and equal upper and lower extremity strength bilaterally. Equal grip strength bilaterally. Normal finger to nose and heel to shin. No pronator drift.   Skin: Skin is warm and dry.  Psychiatric: She has a normal mood and affect. Her behavior is normal.  Nursing note and vitals reviewed.   ED Course  Procedures (including critical care time) Labs Review Labs Reviewed - No data to display  Imaging Review No  results found. I have personally reviewed and evaluated these images and lab results as part of my medical decision-making.   EKG Interpretation None      MDM   Final diagnoses:  Nonintractable headache, unspecified chronicity pattern, unspecified headache type   Pt with recurrent migraine. Normal neurological exam. Headache similar to prior. Pt non toxic appearing. Will treat with iv medications. At this time, toradol and compazine ordered, will recheck.  2:05 AM Pt received toradol and compazine. Pt feels much better. Wants to go home. Will dc home with outpatient follow up.   Filed Vitals:   05/02/15 2321 05/03/15 0235  BP: 142/90 159/115  Pulse: 65 87  Temp: 97.6 F (36.4 C)   TempSrc: Oral   Resp: 20 18  Height: 5\' 6"  (1.676 m)   Weight: 129.275 kg   SpO2: 100% 97%     Jeannett Senior, PA-C 05/03/15 0250  Shanon Rosser, MD 05/03/15 [phone]

## 2015-06-06 ENCOUNTER — Emergency Department (HOSPITAL_COMMUNITY): Payer: Self-pay

## 2015-06-06 ENCOUNTER — Encounter (HOSPITAL_COMMUNITY): Payer: Self-pay | Admitting: Emergency Medicine

## 2015-06-06 ENCOUNTER — Emergency Department (HOSPITAL_COMMUNITY)
Admission: EM | Admit: 2015-06-06 | Discharge: 2015-06-07 | Disposition: A | Discharge status: Home / Self Care | Payer: Self-pay | Attending: Emergency Medicine | Admitting: Emergency Medicine

## 2015-06-06 DIAGNOSIS — R1031 Right lower quadrant pain: Secondary | ICD-10-CM

## 2015-06-06 DIAGNOSIS — Z8619 Personal history of other infectious and parasitic diseases: Secondary | ICD-10-CM | POA: Insufficient documentation

## 2015-06-06 DIAGNOSIS — Z862 Personal history of diseases of the blood and blood-forming organs and certain disorders involving the immune mechanism: Secondary | ICD-10-CM | POA: Insufficient documentation

## 2015-06-06 DIAGNOSIS — Z8659 Personal history of other mental and behavioral disorders: Secondary | ICD-10-CM | POA: Insufficient documentation

## 2015-06-06 DIAGNOSIS — Z87440 Personal history of urinary (tract) infections: Secondary | ICD-10-CM | POA: Insufficient documentation

## 2015-06-06 DIAGNOSIS — Z8679 Personal history of other diseases of the circulatory system: Secondary | ICD-10-CM | POA: Insufficient documentation

## 2015-06-06 DIAGNOSIS — K5900 Constipation, unspecified: Secondary | ICD-10-CM | POA: Insufficient documentation

## 2015-06-06 DIAGNOSIS — N739 Female pelvic inflammatory disease, unspecified: Secondary | ICD-10-CM | POA: Insufficient documentation

## 2015-06-06 DIAGNOSIS — Z3202 Encounter for pregnancy test, result negative: Secondary | ICD-10-CM | POA: Insufficient documentation

## 2015-06-06 LAB — COMPREHENSIVE METABOLIC PANEL
ALBUMIN: 3.5 g/dL (ref 3.5–5.0)
ALK PHOS: 52 U/L (ref 38–126)
ALT: 13 U/L — AB (ref 14–54)
AST: 15 U/L (ref 15–41)
Anion gap: 7 (ref 5–15)
BUN: 10 mg/dL (ref 6–20)
CALCIUM: 8.9 mg/dL (ref 8.9–10.3)
CHLORIDE: 105 mmol/L (ref 101–111)
CO2: 27 mmol/L (ref 22–32)
CREATININE: 0.76 mg/dL (ref 0.44–1.00)
GFR calc Af Amer: >60 mL/min (ref >60)
GFR calc non Af Amer: >60 mL/min (ref >60)
GLUCOSE: 112 mg/dL — AB (ref 65–99)
Potassium: 3.7 mmol/L (ref 3.5–5.1)
SODIUM: 139 mmol/L (ref 135–145)
Total Bilirubin: 0.5 mg/dL (ref 0.3–1.2)
Total Protein: 6.4 g/dL — ABNORMAL LOW (ref 6.5–8.1)

## 2015-06-06 LAB — URINALYSIS, ROUTINE W REFLEX MICROSCOPIC
BILIRUBIN URINE: NEGATIVE
GLUCOSE, UA: NEGATIVE mg/dL
HGB URINE DIPSTICK: NEGATIVE
Ketones, ur: NEGATIVE mg/dL
Leukocytes, UA: NEGATIVE
Nitrite: NEGATIVE
Protein, ur: NEGATIVE mg/dL
SPECIFIC GRAVITY, URINE: 1.034 — AB (ref 1.005–1.030)
pH: 6.5 (ref 5.0–8.0)

## 2015-06-06 LAB — POC URINE PREG, ED: PREG TEST UR: NEGATIVE

## 2015-06-06 LAB — CBC
HCT: 38 % (ref 36.0–46.0)
HEMOGLOBIN: 12.7 g/dL (ref 12.0–15.0)
MCH: 27.1 pg (ref 26.0–34.0)
MCHC: 33.4 g/dL (ref 30.0–36.0)
MCV: 81 fL (ref 78.0–100.0)
PLATELETS: 269 10*3/uL (ref 150–400)
RBC: 4.69 MIL/uL (ref 3.87–5.11)
RDW: 13.8 % (ref 11.5–15.5)
WBC: 11 10*3/uL — ABNORMAL HIGH (ref 4.0–10.5)

## 2015-06-06 LAB — LIPASE, BLOOD: LIPASE: 28 U/L (ref 11–51)

## 2015-06-06 MED ORDER — FENTANYL CITRATE (PF) 100 MCG/2ML IJ SOLN
50.0000 ug | Freq: Once | INTRAMUSCULAR | Status: AC
  Administered 2015-06-06: 50 ug via INTRAVENOUS
  Filled 2015-06-06: qty 2

## 2015-06-06 MED ORDER — ONDANSETRON HCL 4 MG/2ML IJ SOLN
4.0000 mg | Freq: Once | INTRAMUSCULAR | Status: AC
  Administered 2015-06-06: 4 mg via INTRAVENOUS
  Filled 2015-06-06: qty 2

## 2015-06-06 MED ORDER — DIATRIZOATE MEGLUMINE & SODIUM 66-10 % PO SOLN: 15.0000 mL | Freq: Once | ORAL | Status: DC

## 2015-06-06 MED ORDER — SODIUM CHLORIDE 0.9 % IV BOLUS (SEPSIS)
1000.0000 mL | Freq: Once | INTRAVENOUS | Status: AC
  Administered 2015-06-06: 1000 mL via INTRAVENOUS

## 2015-06-06 MED ORDER — IOPAMIDOL (ISOVUE-300) INJECTION 61%
100.0000 mL | Freq: Once | INTRAVENOUS | Status: AC | PRN
  Administered 2015-06-06: 100 mL via INTRAVENOUS

## 2015-06-06 NOTE — ED Notes (Signed)
Pt transported to CT ?

## 2015-06-06 NOTE — ED Notes (Signed)
PA at bedside.

## 2015-06-06 NOTE — ED Provider Notes (Signed)
CSN: [id number]     Arrival date & time 06/06/15  1903 History   First MD Initiated Contact with Patient 06/06/15 2204     Chief Complaint  Patient presents with  . Abdominal Pain     (Consider location/radiation/quality/duration/timing/severity/associated sxs/prior Treatment) HPI Comments: 30 year old female with a history of bipolar 1 disorder, depression, and migraine headaches presents to the emergency department for evaluation of right lower quadrant pain. Patient states that she awoke with pain in her lower abdomen. Pain has been constant and worsening. Symptoms specifically aggravated after eating this morning. Patient has had nausea as well as 4-5 episodes of emesis which have been nonbloody. She reports subjective chills. She has not had a bowel movement in 3 days which is abnormal for her. No medications taken prior to arrival for symptoms. She denies sick contacts, diarrhea, dysuria, hematuria, vaginal bleeding, vaginal discharge. She has no primary care provider. Abdominal surgical history notable for cesarean section 2 and cholecystectomy. Patient has also had a bilateral salpingectomy.  Patient is a 30 y.o. female presenting with abdominal pain. The history is provided by the patient. No language interpreter was used.  Abdominal Pain Associated symptoms: chills, constipation, nausea and vomiting     Past Medical History  Diagnosis Date  . 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 Bipolar 1 disorder ([place])   . 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 Urinary tract infection   . Depression     not currently on meds, being treated- trying to find a med  . Abnormal Pap smear   . Chlamydia 2004  . Anemia     HISTORY W/2006 PREGNANCY  . Dyslexia   . Migraines     otc med prn w/pregnancy   Past Surgical History  Procedure Laterality Date  . Cesarean section    . Myringotomy    . Tonsillectomy    . Induced abortion      vaccum on oct 2009  .  Cholecystectomy  2006  . Eye surgery      left eye surgery - lazy eye  . Cesarean section N/A 04/21/2012    Procedure: CESAREAN SECTION;  Surgeon: Cheri Fowler, MD;  Location: [facility];  Service: Obstetrics;  Laterality: N/A;  Repeat  . Bilateral salpingectomy Bilateral 04/21/2012    Procedure: BILATERAL SALPINGECTOMY;  Surgeon: Cheri Fowler, MD;  Location: [facility];  Service: Obstetrics;  Laterality: Bilateral;   Family History  Problem Relation Age of Onset  . Cancer Maternal Grandmother     Breast cancer  . Cancer Paternal Grandmother     Breast cancer  . Stroke Paternal Grandmother 5  . Diabetes Cousin   . Hypertension Cousin   . Other Neg Hx    Social History  Substance Use Topics  . Smoking status: Never Smoker   . Smokeless tobacco: Never Used  . Alcohol Use: No     Comment: socially but none with pregnancy   OB History    Gravida Para Term Preterm AB TAB SAB Ectopic Multiple Living   3 2 2  0 1 1    2       Review of Systems  Constitutional: Positive for chills.  Gastrointestinal: Positive for nausea, vomiting, abdominal pain and constipation.  All other systems reviewed and are negative.   Allergies  Hydrocodone-acetaminophen and Adhesive  Home Medications   Prior to Admission medications   Medication Sig Start Date End Date Taking?  Authorizing Provider  acetaminophen (TYLENOL) 500 MG tablet Take 1,000 mg by mouth every 6 (six) hours as needed for mild pain, moderate pain or headache.   Yes Historical Provider, MD  doxycycline (VIBRAMYCIN) 100 MG capsule Take 1 capsule (100 mg total) by mouth 2 (two) times daily. 06/07/15   Antonietta Breach, PA-C  traMADol (ULTRAM) 50 MG tablet Take 1 tablet (50 mg total) by mouth every 6 (six) hours as needed. 06/07/15   Antonietta Breach, PA-C   BP 144/80 mmHg  Pulse 70  Temp(Src) 97.9 F (36.6 C) (Oral)  Resp 18  Ht 5\' 6"  (1.676 m)  Wt 129.275 kg  BMI 46.02 kg/m2  SpO2 100%  LMP 05/19/2015   Physical Exam  Constitutional: She is  oriented to person, place, and time. She appears well-developed and well-nourished. No distress.  Nontoxic/nonseptic appearing  HENT:  Head: Normocephalic and atraumatic.  Eyes: Conjunctivae and EOM are normal. No scleral icterus.  Neck: Normal range of motion.  Cardiovascular: Normal rate, regular rhythm and intact distal pulses.   Pulmonary/Chest: Effort normal. No respiratory distress. She has no wheezes.  Respirations even and unlabored  Abdominal: Soft. There is tenderness. There is no rebound and no guarding.  Soft abdomen with tenderness to palpation in the right lower quadrant and at McBurney's point. Abdomen is obese, without masses or rigidity. No peritoneal signs. Exam slightly limited secondary to body habitus.  Genitourinary: There is no rash, tenderness or lesion on the right labia. There is no rash, tenderness or lesion on the left labia. Uterus is tender. Cervix exhibits no motion tenderness and no friability. Right adnexum displays tenderness (mild). Right adnexum displays no mass. Left adnexum displays no mass and no tenderness. No bleeding in the vagina. No vaginal discharge found.  No TTP out of proportion to exam.  Musculoskeletal: Normal range of motion.  Neurological: She is alert and oriented to person, place, and time. She exhibits normal muscle tone. Coordination normal.  GCS 15. Patient moving all extremities.  Skin: Skin is warm and dry. No rash noted. She is not diaphoretic. No erythema. No pallor.  Psychiatric: She has a normal mood and affect. Her behavior is normal.  Nursing note and vitals reviewed.   ED Course  Procedures (including critical care time) Labs Review Labs Reviewed  COMPREHENSIVE METABOLIC PANEL - Abnormal; Notable for the following:    Glucose, Bld 112 (*)    Total Protein 6.4 (*)    ALT 13 (*)    All other components within normal limits  CBC - Abnormal; Notable for the following:    WBC 11.0 (*)    All other components within normal  limits  URINALYSIS, ROUTINE W REFLEX MICROSCOPIC (NOT AT [facility]) - Abnormal; Notable for the following:    APPearance CLOUDY (*)    Specific Gravity, Urine 1.034 (*)    All other components within normal limits  WET PREP, GENITAL  LIPASE, BLOOD  POC URINE PREG, ED  GC/CHLAMYDIA PROBE AMP ([place]) NOT AT [facility]    Imaging Review Ct Abdomen Pelvis W Contrast  06/07/2015  CLINICAL DATA:  Right lower quadrant pain and emesis today. EXAM: CT ABDOMEN AND PELVIS WITH CONTRAST TECHNIQUE: Multidetector CT imaging of the abdomen and pelvis was performed using the standard protocol following bolus administration of intravenous contrast. CONTRAST:  160mL ISOVUE-300 IOPAMIDOL (ISOVUE-300) INJECTION 61% COMPARISON:  No recent exams.  Remote CT 10/17/2008 FINDINGS: Lower chest: The included lung bases are clear. Heart is normal in size. No pleural  effusion. Liver: No focal lesion. Prominent size measuring 22 cm craniocaudal. Hepatobiliary: Clips in the gallbladder fossa postcholecystectomy. No biliary dilatation. Pancreas: No ductal dilatation or inflammation. Spleen: Normal. Adrenal glands: No nodule. Kidneys: Symmetric renal enhancement. No hydronephrosis. No perinephric stranding. Stomach/Bowel: Stomach physiologically distended. There are no dilated or thickened small bowel loops. Moderate volume of stool throughout the colon without colonic wall thickening. The appendix is well-visualized and normal. Vascular/Lymphatic: No retroperitoneal adenopathy. Abdominal aorta is normal in caliber. Reproductive: Uterus normal in size. Ovaries symmetric in size. Mild prominence of the right ovarian veins without definite adnexal enhancement. Bladder: Physiologically distended, no wall thickening. Other: No free air, free fluid, or intra-abdominal fluid collection. Musculoskeletal: There are no acute or suspicious osseous abnormalities. IMPRESSION: 1. Normal appendix. 2. Mild prominence of the right ovarian veins, can be  seen with pelvic congestion. There is otherwise no acute abnormality. Electronically Signed   By: Jeb Levering M.D.   On: 06/07/2015 00:04     I have personally reviewed and evaluated these images and lab results as part of my medical decision-making.   EKG Interpretation None      MDM   Final diagnoses:  Pelvic inflammatory disease  Right lower quadrant abdominal pain    30 year old female presents to the emergency department for evaluation of right lower quadrant abdominal pain. She is afebrile and hemodynamically stable. She has no signs of acute surgical abdomen on exam. CT scan obtained given mild leukocytosis to evaluate for appendicitis. CT shows a normal appendix with evidence of mild prominence of the right ovarian veins consistent with pelvic congestion. Wet prep with clue cells and moderate white blood cells. Patient denies any vaginal complaints including bleeding or discharge. Plan to treat for pelvic inflammatory disease. Initial dose of Rocephin given in the emergency department. Will discharge with 14 day course of doxycycline. Pain in the emergency department has improved with 4 mg of IV morphine. Will discharge with tramadol. OB/GYN follow-up advised and return precautions given. Patient discharged in satisfactory condition with no unaddressed concerns.   Filed Vitals:   06/06/15 1909 06/06/15 2224 06/07/15 0026  BP: 132/113 141/79 144/80  Pulse: 82 74 70  Temp: 97.9 F (36.6 C)    TempSrc: Oral    Resp: 18 18 18   Height: 5\' 6"  (1.676 m)    Weight: 129.275 kg    SpO2: 100% 100% 100%     Antonietta Breach, PA-C 06/07/15 0100  Julianne Rice, MD 06/09/15 [phone]

## 2015-06-06 NOTE — ED Notes (Signed)
Bed: WTR7 Expected date:  Expected time:  Means of arrival:  Comments: Phlebotomy 

## 2015-06-06 NOTE — ED Notes (Signed)
Pt c/o RLQ pain onset this am with emesis x 6 today. Denies diarrhea. Denies urinary s/s. Denies GYN s/s.

## 2015-06-07 LAB — WET PREP, GENITAL
SPERM: NONE SEEN
TRICH WET PREP: NONE SEEN
Yeast Wet Prep HPF POC: NONE SEEN

## 2015-06-07 LAB — GC/CHLAMYDIA PROBE AMP (~~LOC~~) NOT AT ARMC
Chlamydia: NEGATIVE
Neisseria Gonorrhea: NEGATIVE

## 2015-06-07 MED ORDER — TRAMADOL HCL 50 MG PO TABS: 50.0000 mg | ORAL_TABLET | Freq: Four times a day (QID) | ORAL | Status: DC | PRN

## 2015-06-07 MED ORDER — TRAMADOL HCL 50 MG PO TABS
50.0000 mg | ORAL_TABLET | Freq: Once | ORAL | Status: AC
  Administered 2015-06-07: 50 mg via ORAL
  Filled 2015-06-07: qty 1

## 2015-06-07 MED ORDER — DEXTROSE 5 % IV SOLN
250.0000 mg | Freq: Once | INTRAVENOUS | Status: AC
  Administered 2015-06-07: 250 mg via INTRAVENOUS
  Filled 2015-06-07: qty 250

## 2015-06-07 MED ORDER — DOXYCYCLINE HYCLATE 100 MG PO CAPS: 100.0000 mg | ORAL_CAPSULE | Freq: Two times a day (BID) | ORAL | Status: DC

## 2015-06-07 NOTE — Discharge Instructions (Signed)

## 2015-11-20 ENCOUNTER — Emergency Department (HOSPITAL_COMMUNITY): Payer: Self-pay

## 2015-11-20 ENCOUNTER — Emergency Department (HOSPITAL_COMMUNITY)
Admission: EM | Admit: 2015-11-20 | Discharge: 2015-11-20 | Disposition: A | Discharge status: Home / Self Care | Payer: Self-pay | Attending: Emergency Medicine | Admitting: Emergency Medicine

## 2015-11-20 ENCOUNTER — Encounter (HOSPITAL_COMMUNITY): Payer: Self-pay | Admitting: Emergency Medicine

## 2015-11-20 DIAGNOSIS — Y999 Unspecified external cause status: Secondary | ICD-10-CM | POA: Insufficient documentation

## 2015-11-20 DIAGNOSIS — W109XXA Fall (on) (from) unspecified stairs and steps, initial encounter: Secondary | ICD-10-CM | POA: Insufficient documentation

## 2015-11-20 DIAGNOSIS — Y9301 Activity, walking, marching and hiking: Secondary | ICD-10-CM | POA: Insufficient documentation

## 2015-11-20 DIAGNOSIS — R519 Headache, unspecified: Secondary | ICD-10-CM

## 2015-11-20 DIAGNOSIS — S93401A Sprain of unspecified ligament of right ankle, initial encounter: Secondary | ICD-10-CM | POA: Insufficient documentation

## 2015-11-20 DIAGNOSIS — S93601A Unspecified sprain of right foot, initial encounter: Secondary | ICD-10-CM

## 2015-11-20 DIAGNOSIS — S93402A Sprain of unspecified ligament of left ankle, initial encounter: Secondary | ICD-10-CM | POA: Insufficient documentation

## 2015-11-20 DIAGNOSIS — R42 Dizziness and giddiness: Secondary | ICD-10-CM | POA: Insufficient documentation

## 2015-11-20 DIAGNOSIS — Y929 Unspecified place or not applicable: Secondary | ICD-10-CM | POA: Insufficient documentation

## 2015-11-20 DIAGNOSIS — R51 Headache: Secondary | ICD-10-CM | POA: Insufficient documentation

## 2015-11-20 LAB — BASIC METABOLIC PANEL
Anion gap: 8 (ref 5–15)
BUN: 10 mg/dL (ref 6–20)
CALCIUM: 8.9 mg/dL (ref 8.9–10.3)
CHLORIDE: 106 mmol/L (ref 101–111)
CO2: 25 mmol/L (ref 22–32)
CREATININE: 0.79 mg/dL (ref 0.44–1.00)
GFR calc Af Amer: >60 mL/min (ref >60)
GFR calc non Af Amer: >60 mL/min (ref >60)
GLUCOSE: 131 mg/dL — AB (ref 65–99)
Potassium: 3.9 mmol/L (ref 3.5–5.1)
Sodium: 139 mmol/L (ref 135–145)

## 2015-11-20 LAB — CBC
HCT: 40.8 % (ref 36.0–46.0)
Hemoglobin: 12.6 g/dL (ref 12.0–15.0)
MCH: 25.3 pg — AB (ref 26.0–34.0)
MCHC: 30.9 g/dL (ref 30.0–36.0)
MCV: 81.9 fL (ref 78.0–100.0)
PLATELETS: 291 10*3/uL (ref 150–400)
RBC: 4.98 MIL/uL (ref 3.87–5.11)
RDW: 14.1 % (ref 11.5–15.5)
WBC: 8 10*3/uL (ref 4.0–10.5)

## 2015-11-20 LAB — URINALYSIS, ROUTINE W REFLEX MICROSCOPIC
Bilirubin Urine: NEGATIVE
GLUCOSE, UA: NEGATIVE mg/dL
HGB URINE DIPSTICK: NEGATIVE
KETONES UR: NEGATIVE mg/dL
Leukocytes, UA: NEGATIVE
Nitrite: NEGATIVE
Protein, ur: 30 mg/dL — AB
Specific Gravity, Urine: 1.04 — ABNORMAL HIGH (ref 1.005–1.030)
pH: 6.5 (ref 5.0–8.0)

## 2015-11-20 LAB — CBG MONITORING, ED: Glucose-Capillary: 137 mg/dL — ABNORMAL HIGH (ref 65–99)

## 2015-11-20 LAB — URINE MICROSCOPIC-ADD ON

## 2015-11-20 LAB — PREGNANCY, URINE: Preg Test, Ur: NEGATIVE

## 2015-11-20 MED ORDER — MECLIZINE HCL 25 MG PO TABS
50.0000 mg | ORAL_TABLET | Freq: Once | ORAL | Status: AC
  Administered 2015-11-20: 50 mg via ORAL
  Filled 2015-11-20: qty 2

## 2015-11-20 MED ORDER — KETOROLAC TROMETHAMINE 60 MG/2ML IM SOLN
60.0000 mg | Freq: Once | INTRAMUSCULAR | Status: AC
  Administered 2015-11-20: 60 mg via INTRAMUSCULAR
  Filled 2015-11-20: qty 2

## 2015-11-20 MED ORDER — MECLIZINE HCL 25 MG PO TABS: 25.0000 mg | ORAL_TABLET | Freq: Three times a day (TID) | ORAL | 0 refills | Status: DC | PRN

## 2015-11-20 NOTE — ED Triage Notes (Signed)
Pt states she started feeling dizzy while walking down the stairs and fell down a few steps. Pt states she believes she lost consciousness. Pt has hx of vertigo and passing out with dizziness. Pt states her buttocks and right ankle are hurting. Denies neck pain. Pt states she feels a little dizzy now and has a HA. Pt has history of migraines.

## 2015-11-20 NOTE — Discharge Instructions (Signed)
Ice and elevate your ankle and foot. Crutches for 1-2 days as needed. Take tylnol or motrin for pain. Take meclizine for vertigo symptoms. Follow up with primary care doctor for recheck.

## 2015-11-20 NOTE — ED Provider Notes (Signed)
[facility] DEPT Provider Note   CSN: [id number] Arrival date & time: 11/20/15  1302     History   Chief Complaint Chief Complaint  Patient presents with  . Fall  . Loss of Consciousness    HPI Shristi Biter is a 30 y.o. female.  HPI Shaunessy Ala is a 30 y.o. female with hx of bilpolar disorder, anemia, chiari malformation, migraine headaches, vertigo, presents to ED with complaint of a fall. Pt states she was walking down stairs when began feeling dizzy. States she got muffled sensation in her ears, hear ears went numb, and she felt sensation of room spinning. States next thing she remembers is waking up on the floor. Pt reports all these symptoms are typical of her vertigo. Pt also reports headache over last several days. States headache similar to that of prior migraines. Reports pain is worse with movement of the head. Onset of headache is gradual. Has tried tylenol and motrin with no relief. Pt reports she believes she injured lower back and right ankle during the fall. Unable to bear any weight on the ankle/food. Denies numbness or swelling in extremities.   Past Medical History:  Diagnosis Date  . Abnormal Pap smear   . Anemia    HISTORY W/2006 PREGNANCY  . Bipolar 1 disorder ([place])   . 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 Chlamydia 2004  . Depression    not currently on meds, being treated- trying to find a med  . Dyslexia   . Migraines    otc med prn w/pregnancy  . 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 Urinary tract infection     Patient Active Problem List   Diagnosis Date Noted  . Vertigo--positional 09/14/2012  . Unplanned wanted pregnancy 09/04/2011  . Chiari I malformation ([place]) 05/30/2011  . Preventative health care 10/23/2010  . Headache(784.0) 08/26/2010  . MAJOR DEPRESSIVE DISORDER RECURRENT EPISODE MILD 09/21/2007    Past Surgical History:  Procedure Laterality Date  . BILATERAL SALPINGECTOMY Bilateral  04/21/2012   Procedure: BILATERAL SALPINGECTOMY;  Surgeon: Cheri Fowler, MD;  Location: [facility];  Service: Obstetrics;  Laterality: Bilateral;  . CESAREAN SECTION    . CESAREAN SECTION N/A 04/21/2012   Procedure: CESAREAN SECTION;  Surgeon: Cheri Fowler, MD;  Location: [facility];  Service: Obstetrics;  Laterality: N/A;  Repeat  . CHOLECYSTECTOMY  2006  . EYE SURGERY     left eye surgery - lazy eye  . INDUCED ABORTION     vaccum on oct 2009  . MYRINGOTOMY    . TONSILLECTOMY      OB History    Gravida Para Term Preterm AB Living   3 2 2  0 1 2   SAB TAB Ectopic Multiple Live Births     1     2       Home Medications    Prior to Admission medications   Medication Sig Start Date End Date Taking? Authorizing Provider  acetaminophen (TYLENOL) 500 MG tablet Take 1,000 mg by mouth every 6 (six) hours as needed for mild pain, moderate pain or headache.    Historical Provider, MD  doxycycline (VIBRAMYCIN) 100 MG capsule Take 1 capsule (100 mg total) by mouth 2 (two) times daily. 06/07/15   Antonietta Breach, PA-C  traMADol (ULTRAM) 50 MG tablet Take 1 tablet (50 mg total) by mouth every 6 (six) hours as needed. 06/07/15   Antonietta Breach, PA-C    Family History  Family History  Problem Relation Age of Onset  . Cancer Maternal Grandmother     Breast cancer  . Cancer Paternal Grandmother     Breast cancer  . Stroke Paternal Grandmother 23  . Diabetes Cousin   . Hypertension Cousin   . Other Neg Hx     Social History Social History  Substance Use Topics  . Smoking status: Never Smoker  . Smokeless tobacco: Never Used  . Alcohol use No     Comment: socially but none with pregnancy     Allergies   Hydrocodone-acetaminophen and Adhesive [tape]   Review of Systems Review of Systems  Constitutional: Negative for chills and fever.  HENT: Negative for congestion.   Respiratory: Negative for cough, chest tightness and shortness of breath.   Cardiovascular: Negative for chest pain,  palpitations and leg swelling.  Gastrointestinal: Negative for abdominal pain, diarrhea, nausea and vomiting.  Genitourinary: Negative for dysuria, flank pain and pelvic pain.  Musculoskeletal: Positive for arthralgias, joint swelling and myalgias. Negative for neck pain and neck stiffness.  Skin: Negative for rash.  Neurological: Positive for dizziness, syncope, light-headedness and headaches. Negative for weakness.  All other systems reviewed and are negative.    Physical Exam Updated Vital Signs BP 127/84   Pulse 73   Temp 98.6 F (37 C)   Resp 13   Ht 5\' 6"  (1.676 m)   Wt 129.3 kg   LMP 11/16/2015   SpO2 99%   BMI 46.00 kg/m   Physical Exam  Constitutional: She is oriented to person, place, and time. She appears well-developed and well-nourished. No distress.  HENT:  Head: Normocephalic.  Eyes: Conjunctivae and EOM are normal. Pupils are equal, round, and reactive to light.  Neck: Neck supple.  Cardiovascular: Normal rate, regular rhythm and normal heart sounds.   Pulmonary/Chest: Effort normal and breath sounds normal. No respiratory distress. She has no wheezes. She has no rales.  Abdominal: Soft. Bowel sounds are normal. She exhibits no distension. There is no tenderness. There is no rebound.  Musculoskeletal: She exhibits no edema.  Bruising and swelling noted to the lateral malleolus of left ankle. ttp over lateral malleolus of the ankle. Achilles tendon intact. ttp and bruising and swelling over lateral right 4th and 5th mettarsals. Toes all normal. Cap refill <2 sec. ttp over midline lumbar spine. No deformity noted. No ttp over thoracic spine. Full rom of bilateral upper and lower extremities other than right ankle  Neurological: She is alert and oriented to person, place, and time. No cranial nerve deficit. Coordination normal.  Skin: Skin is warm and dry.  Psychiatric: She has a normal mood and affect. Her behavior is normal.  Nursing note and vitals  reviewed.    ED Treatments / Results  Labs (all labs ordered are listed, but only abnormal results are displayed) Labs Reviewed  BASIC METABOLIC PANEL - Abnormal; Notable for the following:       Result Value   Glucose, Bld 131 (*)    All other components within normal limits  CBC - Abnormal; Notable for the following:    MCH 25.3 (*)    All other components within normal limits  URINALYSIS, ROUTINE W REFLEX MICROSCOPIC (NOT AT [facility]) - Abnormal; Notable for the following:    APPearance HAZY (*)    Specific Gravity, Urine 1.040 (*)    Protein, ur 30 (*)    All other components within normal limits  URINE MICROSCOPIC-ADD ON - Abnormal; Notable for the following:  Squamous Epithelial / LPF TOO NUMEROUS TO COUNT (*)    Bacteria, UA MANY (*)    All other components within normal limits  CBG MONITORING, ED - Abnormal; Notable for the following:    Glucose-Capillary 137 (*)    All other components within normal limits  PREGNANCY, URINE    EKG  EKG Interpretation  Date/Time:  Tuesday November 20 2015 13:20:56 EDT Ventricular Rate:  91 PR Interval:  136 QRS Duration: 84 QT Interval:  374 QTC Calculation: 460 R Axis:   31 Text Interpretation:  Normal sinus rhythm Septal infarct , age undetermined Abnormal ECG No significant change since last tracing Confirmed by LITTLE MD, RACHEL [phone]) on 11/20/2015 2:28:58 PM       Radiology No results found.  Procedures Procedures (including critical care time)  Medications Ordered in ED Medications  ketorolac (TORADOL) injection 60 mg (not administered)  meclizine (ANTIVERT) tablet 50 mg (50 mg Oral Given 11/20/15 1539)     Initial Impression / Assessment and Plan / ED Course  I have reviewed the triage vital signs and the nursing notes.  Pertinent labs & imaging results that were available during my care of the patient were reviewed by me and considered in my medical decision making (see chart for details).  Clinical Course     Patient emergency department with vertigo-like symptoms which is typical for her. She reports episode of dizziness, possible loss of consciousness while walking down the stairs. She is currently complaining of headache that's typical of her migraines. She otherwise in no acute distress. She does have pain to her right ankle and foot after fall as well as pain to the lower back. Will get x-rays. Labs and urinalysis pending. EKG unremarkable.  6:05 PM X-rays all negative. Patient is not orthostatic. No significant lab abnormalities. Patient was given crutches and ASO for the right ankle sprain. I ambulated her myself with crutches, she is able to walk with no difficulties and no dizziness. She did report some dizziness initially upon standing up, but after sitting down for approximately 10 seconds she felt much better. Will start on meclizine. Home with Tylenol and Motrin for pain. Follow-up as needed.  Vitals:   11/20/15 1530 11/20/15 1538 11/20/15 1735 11/20/15 1745  BP: 113/78  140/92 120/73  Pulse: 66  69 61  Resp: 12  24 18   Temp:  98.6 F (37 C)    TempSrc:      SpO2: 95%  99% 96%  Weight:      Height:         Final Clinical Impressions(s) / ED Diagnoses   Final diagnoses:  Vertigo  Nonintractable headache, unspecified chronicity pattern, unspecified headache type  Sprain of right ankle, unspecified ligament, initial encounter  Sprain of right foot, initial encounter    New Prescriptions New Prescriptions   MECLIZINE (ANTIVERT) 25 MG TABLET    Take 1 tablet (25 mg total) by mouth 3 (three) times daily as needed for dizziness.     Jeannett Senior, PA-C 11/20/15 [place], MD 11/24/15 1719

## 2015-11-20 NOTE — Progress Notes (Signed)
Orthopedic Tech Progress Note Patient Details:  Martha Mathews [birth year] [id number]  Ortho Devices Type of Ortho Device: ASO, Crutches Ortho Device/Splint Location: Rt Leg Ortho Device/Splint Interventions: Ordered, Application   Charlott Rakes 11/20/2015, 5:48 PM

## 2016-03-09 ENCOUNTER — Emergency Department (HOSPITAL_COMMUNITY): Payer: Medicaid Other

## 2016-03-09 ENCOUNTER — Emergency Department (HOSPITAL_COMMUNITY)
Admission: EM | Admit: 2016-03-09 | Discharge: 2016-03-09 | Disposition: A | Discharge status: Home / Self Care | Payer: Medicaid Other | Attending: Emergency Medicine | Admitting: Emergency Medicine

## 2016-03-09 ENCOUNTER — Encounter (HOSPITAL_COMMUNITY): Payer: Self-pay | Admitting: Emergency Medicine

## 2016-03-09 DIAGNOSIS — S90812A Abrasion, left foot, initial encounter: Secondary | ICD-10-CM | POA: Insufficient documentation

## 2016-03-09 DIAGNOSIS — Y999 Unspecified external cause status: Secondary | ICD-10-CM | POA: Insufficient documentation

## 2016-03-09 DIAGNOSIS — W19XXXA Unspecified fall, initial encounter: Secondary | ICD-10-CM

## 2016-03-09 DIAGNOSIS — Y929 Unspecified place or not applicable: Secondary | ICD-10-CM | POA: Insufficient documentation

## 2016-03-09 DIAGNOSIS — Y9301 Activity, walking, marching and hiking: Secondary | ICD-10-CM | POA: Insufficient documentation

## 2016-03-09 DIAGNOSIS — S99912A Unspecified injury of left ankle, initial encounter: Secondary | ICD-10-CM | POA: Insufficient documentation

## 2016-03-09 DIAGNOSIS — W208XXA Other cause of strike by thrown, projected or falling object, initial encounter: Secondary | ICD-10-CM | POA: Insufficient documentation

## 2016-03-09 DIAGNOSIS — M79672 Pain in left foot: Secondary | ICD-10-CM

## 2016-03-09 MED ORDER — IBUPROFEN 800 MG PO TABS
800.0000 mg | ORAL_TABLET | Freq: Once | ORAL | Status: AC
  Administered 2016-03-09: 800 mg via ORAL
  Filled 2016-03-09: qty 1

## 2016-03-09 NOTE — ED Provider Notes (Signed)
[facility] DEPT Provider Note   CSN: [id number] Arrival date & time: 03/09/16  1842     History   Chief Complaint Chief Complaint  Patient presents with  . left ankle injury    HPI Palynn Toral is a 31 y.o. female with pmh of chiari malformation and migraines presents to ED reporting L ankle pain and swelling after fall PTA.  Pt states she was walking on her wooden back deck barefoot when one of the boards cracked causing her L foot to go through the board.  Pt was able to get her foot out of the board herself.  Pt states fall was accidental, she denies dizziness, blurred vision, headache, chest pain or shortness of breath prior to fall.  No head trauma or LOC after fall. No previous injury or surgery to L ankle or foot.   HPI  Past Medical History:  Diagnosis Date  . Abnormal Pap smear   . Anemia    HISTORY W/2006 PREGNANCY  . Bipolar 1 disorder ([place])   . 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 Chlamydia 2004  . Depression    not currently on meds, being treated- trying to find a med  . Dyslexia   . Migraines    otc med prn w/pregnancy  . 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 Urinary tract infection     Patient Active Problem List   Diagnosis Date Noted  . Vertigo--positional 09/14/2012  . Unplanned wanted pregnancy 09/04/2011  . Chiari I malformation ([place]) 05/30/2011  . Preventative health care 10/23/2010  . Headache(784.0) 08/26/2010  . MAJOR DEPRESSIVE DISORDER RECURRENT EPISODE MILD 09/21/2007    Past Surgical History:  Procedure Laterality Date  . BILATERAL SALPINGECTOMY Bilateral 04/21/2012   Procedure: BILATERAL SALPINGECTOMY;  Surgeon: Cheri Fowler, MD;  Location: [facility];  Service: Obstetrics;  Laterality: Bilateral;  . CESAREAN SECTION    . CESAREAN SECTION N/A 04/21/2012   Procedure: CESAREAN SECTION;  Surgeon: Cheri Fowler, MD;  Location: [facility];  Service: Obstetrics;  Laterality: N/A;  Repeat  . CHOLECYSTECTOMY   2006  . EYE SURGERY     left eye surgery - lazy eye  . INDUCED ABORTION     vaccum on oct 2009  . MYRINGOTOMY    . TONSILLECTOMY      OB History    Gravida Para Term Preterm AB Living   3 2 2  0 1 2   SAB TAB Ectopic Multiple Live Births     1     2       Home Medications    Prior to Admission medications   Medication Sig Start Date End Date Taking? Authorizing Provider  acetaminophen (TYLENOL) 500 MG tablet Take 1,000 mg by mouth every 6 (six) hours as needed for mild pain, moderate pain or headache.    Historical Provider, MD  doxycycline (VIBRAMYCIN) 100 MG capsule Take 1 capsule (100 mg total) by mouth 2 (two) times daily. 06/07/15   Antonietta Breach, PA-C  meclizine (ANTIVERT) 25 MG tablet Take 1 tablet (25 mg total) by mouth 3 (three) times daily as needed for dizziness. 11/20/15   Tatyana Kirichenko, PA-C  traMADol (ULTRAM) 50 MG tablet Take 1 tablet (50 mg total) by mouth every 6 (six) hours as needed. 06/07/15   Antonietta Breach, PA-C    Family History Family History  Problem Relation Age of Onset  . Cancer Maternal Grandmother     Breast cancer  .  Cancer Paternal Grandmother     Breast cancer  . Stroke Paternal Grandmother 50  . Diabetes Cousin   . Hypertension Cousin   . Other Neg Hx     Social History Social History  Substance Use Topics  . Smoking status: Never Smoker  . Smokeless tobacco: Never Used  . Alcohol use No     Comment: socially but none with pregnancy     Allergies   Hydrocodone-acetaminophen and Adhesive [tape]   Review of Systems Review of Systems  Constitutional: Negative for fever.  HENT: Negative for congestion and sore throat.   Eyes: Negative for visual disturbance.  Respiratory: Negative for cough and shortness of breath.   Cardiovascular: Negative for chest pain.  Gastrointestinal: Negative for abdominal pain, constipation, diarrhea, nausea and vomiting.  Genitourinary: Negative for difficulty urinating.  Musculoskeletal: Positive  for arthralgias (L ankle), gait problem and joint swelling (L foot/ankle).  Skin: Positive for wound (L foot).  Neurological: Negative for dizziness, weakness, light-headedness, numbness and headaches.     Physical Exam Updated Vital Signs BP 137/99   Pulse 65   Temp 98 F (36.7 C) (Oral)   Resp 18   LMP 03/02/2016   SpO2 100%   Physical Exam  Constitutional: She is oriented to person, place, and time. She appears well-developed and well-nourished. No distress.  NAD.  HENT:  Head: Normocephalic and atraumatic.  Right Ear: External ear normal.  Left Ear: External ear normal.  Nose: Nose normal.  Eyes: Conjunctivae and EOM are normal. Pupils are equal, round, and reactive to light. No scleral icterus.  Neck: Normal range of motion. Neck supple. No JVD present.  Cardiovascular: Normal rate, regular rhythm and normal heart sounds.   No murmur heard. Pulmonary/Chest: Effort normal and breath sounds normal. She has no wheezes.  Abdominal: Soft. There is no tenderness.  Musculoskeletal: Normal range of motion. She exhibits no deformity.  There is mild edema around lateral malleolus and lateral midfoot. Superficial skin abrasions on lateral aspect of mid foot not actively bleeding. Full active ROM of bilateral ankles without reported pain.  Full passive ROM of bilateral ankles without crepitus.  Patient NOT able to bear weight in ED (4+ steps).   There is bony tenderness over posterior aspect of lateral.  No bony tenderness over medial malleoli, navicular bone or 5th metatarsal base.   Achilles tendon is non tender. + Talar tilt. Negative anterior and posterior drawer.  Negative syndesmosis squeeze test. Negative Thompson test.   Lymphadenopathy:    She has no cervical adenopathy.  Neurological: She is alert and oriented to person, place, and time.  Skin: Skin is warm and dry. Capillary refill takes less than 2 seconds.  Psychiatric: She has a normal mood and affect. Her behavior is  normal. Judgment and thought content normal.  Nursing note and vitals reviewed.    ED Treatments / Results  Labs (all labs ordered are listed, but only abnormal results are displayed) Labs Reviewed - No data to display  EKG  EKG Interpretation None       Radiology No results found.  Procedures Procedures (including critical care time)  Medications Ordered in ED Medications  ibuprofen (ADVIL,MOTRIN) tablet 800 mg (800 mg Oral Given 03/09/16 2149)     Initial Impression / Assessment and Plan / ED Course  I have reviewed the triage vital signs and the nursing notes.  Pertinent labs & imaging results that were available during my care of the patient were reviewed by me and  considered in my medical decision making (see chart for details).  Clinical Course as of Mar 12 2153  Wed Mar 12, 2016  2153 Mild soft tissue swelling/ prominence of the lateral malleolus. DG Foot Complete Left [CG]  2154 No acute osseous abnormality of the left ankle. Soft tissue swelling about the ankle more so laterally.   DG Ankle Complete Left [CG]    Clinical Course User Index [CG] Kinnie Feil, PA-C    X-rays remarkable for soft tissue swelling without acute osseous injuries. Pt given ankle brace and crutches and pain med in ED. Pt will be discharged with ibuprofen for pain, RICE and ortho f/u as needed.  Patient able to utilize crutches in ED without difficulty.   Final Clinical Impressions(s) / ED Diagnoses   Final diagnoses:  Left foot pain  Injury of left ankle, initial encounter  Fall, initial encounter    New Prescriptions Discharge Medication List as of 03/09/2016 10:56 PM       Kinnie Feil, PA-C 03/12/16 2155    Forde Dandy, MD 03/13/16 1052

## 2016-03-09 NOTE — Discharge Instructions (Signed)
Your ankle x-rays did not show bony injury or dislocation. Your pain is most likely due to an injury to your ligaments. Please read the attached information on "ankle sprain", "ankle exercises" and "musculoskeletal pain".  Please take ibuprofen 600 mg three times a day.  Rest, ice, and elevate your foot for the next 2 days, after 2 days you may start doing mild ankle exercises.  Wear your ankle brace for support for at least 1 week.

## 2016-03-09 NOTE — ED Triage Notes (Signed)
Patient reports walking on back deck and board broke causing her left foot to fall through deck. Patient c/o left ankle and foot pain. Patient has abrasion to left foot with no bleeding at this time.

## 2016-05-26 ENCOUNTER — Encounter: Payer: Self-pay | Admitting: Neurology

## 2016-05-26 ENCOUNTER — Ambulatory Visit (INDEPENDENT_AMBULATORY_CARE_PROVIDER_SITE_OTHER): Payer: Medicaid Other | Admitting: Neurology

## 2016-05-26 VITALS — BP 136/94 | HR 81 | Ht 66.0 in | Wt 291.8 lb

## 2016-05-26 DIAGNOSIS — R42 Dizziness and giddiness: Secondary | ICD-10-CM | POA: Diagnosis not present

## 2016-05-26 DIAGNOSIS — G935 Compression of brain: Secondary | ICD-10-CM

## 2016-05-26 DIAGNOSIS — R51 Headache: Secondary | ICD-10-CM | POA: Diagnosis not present

## 2016-05-26 DIAGNOSIS — R519 Headache, unspecified: Secondary | ICD-10-CM

## 2016-05-26 DIAGNOSIS — R0683 Snoring: Secondary | ICD-10-CM | POA: Diagnosis not present

## 2016-05-26 MED ORDER — TOPIRAMATE 100 MG PO TABS: 150.0000 mg | ORAL_TABLET | Freq: Every day | ORAL | 11 refills | Status: DC

## 2016-05-26 MED ORDER — METHYLPREDNISOLONE 4 MG PO TBPK: ORAL_TABLET | ORAL | 1 refills | Status: DC

## 2016-05-26 MED ORDER — SUMATRIPTAN SUCCINATE 100 MG PO TABS: 100.0000 mg | ORAL_TABLET | Freq: Once | ORAL | 12 refills | Status: DC | PRN

## 2016-05-26 MED ORDER — CYCLOBENZAPRINE HCL 5 MG PO TABS: ORAL_TABLET | ORAL | 6 refills | Status: DC

## 2016-05-26 NOTE — Progress Notes (Signed)
[facility]    Provider:  Dr Jaynee Eagles Referring Provider: Javier Docker, MD Primary Care Physician:  Javier Docker, MD  CC:  Occipital headache, vertigo, chiari malformation  HPI:  Martha Mathews is a 31 y.o. female here as a referral from Dr. Alphonzo Grieve for chronic migraines. She is obese and has had headaches since a child. When she wakes up she has a very bad migraine on the top of her head. She has daily headaches in the occipital areas. She gets dizzy, liht headed, ears feel numb. Does not increase with valsalva. She smells heavily of smoke today bit says she does not smoke. She has known about the chiari since the third grade. The headaches she wakes with are tightness, hurts to open eyes, every morning, she has "swimminess" and room spinning. It is severe, she feels her head is heavy. She snores heavily.  She was recently started on Imitrex and Topiramate. She is on 100mg  daily Topiramate. In addition to the headaches she has in the morning she also has daily headaches where she is dizzy, room spinning, in the occipital area, balance is off, no light or sound sensitivity, no sensitivity to smells just pain in the back of her head with the associated vertigo and dizziness. Unknown triggers. Better in a dark cool room, she gets very hot and needs a fan. She describes the pain in the occipital area as squeezing and pressure not throbbing or pounding.She feels the pain when swallowing but not with coughing or straining. She has vision changes with blurry vision. She also wakes often at night and is fatigued during during the day. No other focal neurologic deficits, associated symptoms, inciting events or modifiable factors.  Reviewed notes, labs and imaging from outside physicians, which showed:  Reviewed lab values, cbc and bmp unremarkable 11/2015  Reviewed primary care notes. Patient is here for chronic headaches and Chiari I malformation. She is already on Topamax  100 mg daily as well as Imitrex 25 mg. She's had headaches since a young age. She was on Imitrex years ago. Daily headaches. The headaches are located occipitally. She has nausea, vomiting, dizziness and photosensitivity with the headaches. They usually last 1 hour but less if she goes into a dark room. She denies syncope or chest pain. She has apparently had an MRI of the brain April 28 which showed a Chiari I malformation but I don't have those images(She had images done at [facility]).   Personally reviewed MRI images of the brain from 2012 and 2006 which showed a chiari malformation of 32mm with crowding of the cisterna magna. Reviewed report from 2001 which showed 10-44mm ectopia (no images to review that far back)  Review of Systems: Patient complains of symptoms per HPI as well as the following symptoms: no CP, no SOB. Pertinent negatives per HPI. All others negative   Social History   Social History  . Marital status: Married    Spouse name: N/A  . Number of children: 2  . Years of education: 9   Occupational History  . Unemployed    Social History Main Topics  . Smoking status: Never Smoker  . Smokeless tobacco: Never Used  . Alcohol use No     Comment: socially but none with pregnancy  . Drug use: No  . Sexual activity: Yes    Birth control/ protection: Surgical   Other Topics Concern  . Not on file   Social History Narrative   Lives at [place] w/ her  husband a/d 2 children   Right-handed   Caffeine: 32 oz per day   Regular exercise- yes    Family History  Problem Relation Age of Onset  . Cancer Maternal Grandmother     Breast cancer  . Cancer Paternal Grandmother     Breast cancer  . Stroke Paternal Grandmother 6  . Diabetes Cousin   . Hypertension Cousin   . Hypertension Father   . Depression Father   . Other Father     radiation for cysts on brain  . Diabetes Mother   . Depression Mother     Past Medical History:  Diagnosis Date  . Abnormal Pap smear   .  Anemia    HISTORY W/2006 PREGNANCY  . Anxiety   . Bipolar 1 disorder ([place])   . 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 Chlamydia 2004  . Depression    not currently on meds, being treated- trying to find a med  . Dyslexia   . Hypertelorism   . Migraines    otc med prn w/pregnancy  . 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 Ringworm   . Urinary tract infection     Past Surgical History:  Procedure Laterality Date  . BILATERAL SALPINGECTOMY Bilateral 04/21/2012   Procedure: BILATERAL SALPINGECTOMY;  Surgeon: Cheri Fowler, MD;  Location: [facility];  Service: Obstetrics;  Laterality: Bilateral;  . CESAREAN SECTION    . CESAREAN SECTION N/A 04/21/2012   Procedure: CESAREAN SECTION;  Surgeon: Cheri Fowler, MD;  Location: [facility];  Service: Obstetrics;  Laterality: N/A;  Repeat  . CHOLECYSTECTOMY  2006  . EYE SURGERY     left eye surgery - lazy eye  . INDUCED ABORTION     vaccum on oct 2009  . MYRINGOTOMY    . TONSILLECTOMY      Current Outpatient Prescriptions  Medication Sig Dispense Refill  . clotrimazole-betamethasone (LOTRISONE) cream Apply 1 application topically 2 (two) times daily.    . fluconazole (DIFLUCAN) 100 MG tablet Take 100 mg by mouth daily.    [name] ibuprofen (ADVIL,MOTRIN) 200 MG tablet Take 200 mg by mouth every 6 (six) hours as needed.    [name] SAPHRIS 5 MG SUBL 24 hr tablet PLACE 1 TABLET UNDER TONGUE AT BEDTIME (MAY USE 1/2 TAB DURING DAY AS NEEDED FOR ANXIETY)  1  . cyclobenzaprine (FLEXERIL) 5 MG tablet Take 1 tablet (5 mg total) by mouth every 8 (eight) hours as needed for headache 60 tablet 6  . methylPREDNISolone (MEDROL DOSEPAK) 4 MG TBPK tablet follow package directions 21 tablet 1  . SUMAtriptan (IMITREX) 100 MG tablet Take 1 tablet (100 mg total) by mouth once as needed. May repeat in 2 hours if headache persists or recurs. 10 tablet 12  . topiramate (TOPAMAX) 100 MG tablet Take 1.5 tablets (150 mg total) by mouth at bedtime. 45 tablet 11    No current facility-administered medications for this visit.     Allergies as of 05/26/2016 - Review Complete 05/26/2016  Allergen Reaction Noted  . Hydrocodone-acetaminophen    . Adhesive [tape] Hives, Rash, and Other (See Comments) 06/01/2012    Vitals: BP (!) 136/94   Pulse 81   Ht 5\' 6"  (1.676 m)   Wt 291 lb 12.8 oz (132.4 kg)   BMI 47.10 kg/m  Last Weight:  Wt Readings from Last 1 Encounters:  05/26/16 291 lb 12.8 oz (132.4 kg)   Last Height:  Ht Readings from Last 1 Encounters:  05/26/16 5\' 6"  (1.676 m)    Physical exam: Exam: Gen: NAD, conversant, well nourised, obese, well groomed                     CV: RRR, no MRG. No Carotid Bruits. No peripheral edema, warm, nontender Eyes: Conjunctivae clear without exudates or hemorrhage  Neuro: Detailed Neurologic Exam  Speech:    Speech is normal; fluent and spontaneous with normal comprehension.  Cognition:    The patient is oriented to person, place, and time;     recent and remote memory intact;     language fluent;     normal attention, concentration,     fund of knowledge Cranial Nerves:    The pupils are equal, round, and reactive to light. The fundi are normal and spontaneous venous pulsations are present. Visual fields are full to finger confrontation. Extraocular movements are intact. Trigeminal sensation is intact and the muscles of mastication are normal. The face is symmetric. The palate elevates in the midline. Hearing intact. Voice is normal. Shoulder shrug is normal. The tongue has normal motion without fasciculations.   Coordination:    Normal finger to nose and heel to shin. Normal rapid alternating movements.   Gait:    Heel-toe and tandem gait are normal.   Motor Observation:    No asymmetry, no atrophy, and no involuntary movements noted. Tone:    Normal muscle tone.    Posture:    Posture is normal. normal erect    Strength:    Strength is V/V in the upper and lower limbs.        Sensation: intact to LT     Reflex Exam:  DTR's:    Deep tendon reflexes in the upper and lower extremities are normal bilaterally.   Toes:    The toes are downgoing bilaterally.   Clonus:    Clonus is absent.      Assessment/Plan:  31 year old patient with chronic headaches mostly in the occipital area with associated vertigo, dizziness, imbalance which may be due to her Chiari malformation. She also has morning headaches, morbid obesity, snoring, daytime fatigue will refer to sleep study.  - Occipital Headache: denies migrainous symptoms, always severe in the occipital area with dizziness, imbalance, vertigo, vision changes. Her chiari was 10-9mm in 2001 and 43mm more recently. Unknown currently, I don't have imaging or report from 05/2016(will request CD and report). However given her symptoms I do think her chiari may be symptomatic. - Snoring, morning headaches, daytime fatigue, morbid obesity: Sleep evaluation - Increase Topiramate to 150mg  she has had tubal ligation and no plans of pregnancy, discussed teratogenicity - NSY referral for symptomatic chiari (occipital h/a, dizziness, imbalance, vertigo), she will bring imaging on CD - Increase Imitrex to 100mg . Please take one tablet at the onset of your headache. If it does not improve the symptoms please take one additional tablet in 2 hours. Do not take more then 2 tablets in 24hrs. Do not take use more then 2 to 3 times in a week. - Flexeril as needed for headache - watch for sedation, do not drive - A medrol dosepak for 6 days - take pills every morning with food - Discussed weight loss  To prevent or relieve headaches, try the following: Cool Compress. Lie down and place a cool compress on your head.  Avoid headache triggers. If certain foods or odors seem to have triggered your migraines in the past,  avoid them. A headache diary might help you identify triggers.  Include physical activity in your daily routine. Try a daily  walk or other moderate aerobic exercise.  Manage stress. Find healthy ways to cope with the stressors, such as delegating tasks on your to-do list.  Practice relaxation techniques. Try deep breathing, yoga, massage and visualization.  Eat regularly. Eating regularly scheduled meals and maintaining a healthy diet might help prevent headaches. Also, drink plenty of fluids.  Follow a regular sleep schedule. Sleep deprivation might contribute to headaches Consider biofeedback. With this mind-body technique, you learn to control certain bodily functions - such as muscle tension, heart rate and blood pressure - to prevent headaches or reduce headache pain.    Proceed to emergency room if you experience new or worsening symptoms or symptoms do not resolve, if you have new neurologic symptoms or if headache is severe, or for any concerning symptom.   Orders Placed This Encounter  Procedures  . Ambulatory referral to Sleep Studies  . Ambulatory referral to Neurosurgery   Cc: Pavelock, Ralene Bathe, MD  Sarina Ill, MD  [facility] [address] [place], [place] [address]  Phone [phone] Fax [phone]

## 2016-05-26 NOTE — Patient Instructions (Signed)
Remember to drink plenty of fluid, eat healthy meals and do not skip any meals. Try to eat protein with a every meal and eat a healthy snack such as fruit or nuts in between meals. Try to keep a regular sleep-wake schedule and try to exercise daily, particularly in the form of walking, 20-30 minutes a day, if you can.   As far as your medications are concerned, I would like to suggest:   Increase Topiramate to 150mg  at night Increase Imitrex to 100mg . Please take one tablet at the onset of your headache. If it does not improve the symptoms please take one additional tablet in 2 hours. Do not take more then 2 tablets in 24hrs. Do not take use more then 2 to 3 times in a week. Flexeril as needed for headache - watch for sedation, do not drive A medrol dosepak for 6 days - take pills every morning with food  As far as diagnostic testing: Needs sleep study, Neurosurgery referral  I would like to see you back in 8 weeks, sooner if we need to. Please call us with any interim questions, concerns, problems, updates or refill requests.   Our phone number is [phone]. We also have an after hours call service for urgent matters and there is a physician on-call for urgent questions. For any emergencies you know to call 911 or go to the nearest emergency room  Chiari Malformation Chiari malformation (CM) is a type of brain abnormality that involves the parts of your brain that are important for balance (cerebellum) and basic body functions (brain stem). Normally, the cerebellum is located in a space at the back part of the skull, just above the opening for the spinal cord (foramen magnum). With CM, part of the cerebellum is located below the foramen magnum instead. This can cause neck pain, balance problems, and other symptoms. There are five types of CM:  Type I.  This is the most common type. It can cause cerebrospinal fluid (CSF) to not flow between your brain and spinal cord as it normally  should.  This type may not cause symptoms, and it often goes unnoticed.  Type II.  This type is present at birth (congenital), and it usually involves a condition in which the spine does not form properly (spina bifida).  Type II also involves having a larger portion of brain structures move down and push through (herniate) the foramen magnum into the spinal canal.  Type III.  This type is more severe than Types I and II, and it is the least common type.  Type III often occurs with a type of congenital disability in which an opening in the skull causes the cerebellum and brain stem and their coverings to bulge out in a sac (encephalocele).  Type IV.  This is also a severe form of CM.  Part of the cerebellum may be missing, and parts of the spine and skull may be visible.  Type 0.  In this type, the cerebellum does not herniate into or below the foramen magnum. However, abnormal flow of CSF between the brain and spinal cord creates a collection of fluid inside the spinal cord (syrinx). This may cause symptoms. What are the causes? CM is most commonly congenital. CM can occur when the skull forms incorrectly and provides less space for the cerebellum than normal. In rare cases, CM may also develop later in life (acquired CM or secondary CM). These cases may be caused by:  Injury.  Infection. Abnormal  structure or pressure develops in the brain, and this pushes the cerebellum down into the foramen magnum. What increases the risk? Congenital CM is more likely to occur in:  Females.  People who have a family history of CM. What are the signs or symptoms? CM may not cause any symptoms. Symptoms may also vary depending on the type and severity of CM. Symptoms may also come and go. The most common symptom is a severe headache in the back of the head. Headache pain may come and go. It may also spread to your neck and shoulders. The pain may be worse when you cough, sneeze, or  strain. Other symptoms include:  Difficulty balancing.  Loss of coordination.  Trouble swallowing or speaking.  Muscle weakness.  Feeling dizzy.  Ringing in the ears.  Fainting.  Trouble sleeping.  Fatigue.  Tingling or burning sensations in the fingers or toes.  Hearing problems.  Vision problems.  Vomiting.  Depression.  Seizures. These are only present in more severe types of CM. How is this diagnosed? This condition may be diagnosed with a medical history and physical exam. This may include tests to check your balance and your memory. You may also have other tests, including:  X-ray.  CT scan.  MRI. How is this treated? Treatment for this condition depends on your symptoms and the type of CM that you have. If you have headaches or neck pain, you may be treated with pain medicine or massage therapy. If you have symptoms of CM that are severe or are getting worse, your health care provider may recommend surgery to control your symptoms and prevent the malformation from getting worse. If you do not have symptoms, you may not need treatment. Follow these instructions at home:  Take medicines only as directed by your health care provider.  Avoid activities that involve straining and heavy lifting.  Consider joining a CM support group.  Keep all follow-up visits as directed by your health care provider. This is important. Contact a health care provider if:  You have new symptoms.  Your symptoms get worse. Get help right away if:  You have seizures that are new or different than before. This information is not intended to replace advice given to you by your health care provider. Make sure you discuss any questions you have with your health care provider. Document Released: 01/24/2002 Document Revised: 07/12/2015 Document Reviewed: 10/12/2013 Elsevier Interactive Patient Education  2017 [employer].  Cyclobenzaprine tablets What is this  medicine? CYCLOBENZAPRINE (sye kloe BEN za preen) is a muscle relaxer. It is used to treat muscle pain, spasms, and stiffness. This medicine may be used for other purposes; ask your health care provider or pharmacist if you have questions. COMMON BRAND NAME(S): Fexmid, Flexeril What should I tell my health care provider before I take this medicine? They need to know if you have any of these conditions: -heart disease, irregular heartbeat, or previous heart attack -liver disease -thyroid problem -an unusual or allergic reaction to cyclobenzaprine, tricyclic antidepressants, lactose, other medicines, foods, dyes, or preservatives -pregnant or trying to get pregnant -breast-feeding How should I use this medicine? Take this medicine by mouth with a glass of water. Follow the directions on the prescription label. If this medicine upsets your stomach, take it with food or milk. Take your medicine at regular intervals. Do not take it more often than directed. Talk to your pediatrician regarding the use of this medicine in children. Special care may be needed. Overdosage: If  you think you have taken too much of this medicine contact a poison control center or emergency room at once. NOTE: This medicine is only for you. Do not share this medicine with others. What if I miss a dose? If you miss a dose, take it as soon as you can. If it is almost time for your next dose, take only that dose. Do not take double or extra doses. What may interact with this medicine? Do not take this medicine with any of the following medications: -certain medicines for fungal infections like fluconazole, itraconazole, ketoconazole, posaconazole, voriconazole -cisapride -dofetilide -dronedarone -halofantrine -levomethadyl -MAOIs like Carbex, Eldepryl, Marplan, Nardil, and Parnate -narcotic medicines for cough -pimozide -thioridazine -ziprasidone This medicine may also interact with the following  medications: -alcohol -antihistamines for allergy, cough and cold -certain medicines for anxiety or sleep -certain medicines for cancer -certain medicines for depression like amitriptyline, fluoxetine, sertraline -certain medicines for infection like alfuzosin, chloroquine, clarithromycin, levofloxacin, mefloquine, pentamidine, troleandomycin -certain medicines for irregular heart beat -certain medicines for seizures like phenobarbital, primidone -contrast dyes -general anesthetics like halothane, isoflurane, methoxyflurane, propofol -local anesthetics like lidocaine, pramoxine, tetracaine -medicines that relax muscles for surgery -narcotic medicines for pain -other medicines that prolong the QT interval (cause an abnormal heart rhythm) -phenothiazines like chlorpromazine, mesoridazine, prochlorperazine This list may not describe all possible interactions. Give your health care provider a list of all the medicines, herbs, non-prescription drugs, or dietary supplements you use. Also tell them if you smoke, drink alcohol, or use illegal drugs. Some items may interact with your medicine. What should I watch for while using this medicine? Tell your doctor or health care professional if your symptoms do not start to get better or if they get worse. You may get drowsy or dizzy. Do not drive, use machinery, or do anything that needs mental alertness until you know how this medicine affects you. Do not stand or sit up quickly, especially if you are an older patient. This reduces the risk of dizzy or fainting spells. Alcohol may interfere with the effect of this medicine. Avoid alcoholic drinks. If you are taking another medicine that also causes drowsiness, you may have more side effects. Give your health care provider a list of all medicines you use. Your doctor will tell you how much medicine to take. Do not take more medicine than directed. Call emergency for help if you have problems breathing or  unusual sleepiness. Your mouth may get dry. Chewing sugarless gum or sucking hard candy, and drinking plenty of water may help. Contact your doctor if the problem does not go away or is severe. What side effects may I notice from receiving this medicine? Side effects that you should report to your doctor or health care professional as soon as possible: -allergic reactions like skin rash, itching or hives, swelling of the face, lips, or tongue -breathing problems -chest pain -fast, irregular heartbeat -hallucinations -seizures -unusually weak or tired Side effects that usually do not require medical attention (report to your doctor or health care professional if they continue or are bothersome): -headache -nausea, vomiting This list may not describe all possible side effects. Call your doctor for medical advice about side effects. You may report side effects to FDA at 1-800-FDA-1088. Where should I keep my medicine? Keep out of the reach of children. Store at room temperature between 15 and 30 degrees C (59 and 86 degrees F). Keep container tightly closed. Throw away any unused medicine after the expiration date. NOTE: This  sheet is a summary. It may not cover all possible information. If you have questions about this medicine, talk to your doctor, pharmacist, or health care provider.  2018 Elsevier/Gold Standard (2014-11-14 12:05:46)  Methylprednisolone tablets What is this medicine? METHYLPREDNISOLONE (meth ill pred NISS oh lone) is a corticosteroid. It is commonly used to treat inflammation of the skin, joints, lungs, and other organs. Common conditions treated include asthma, allergies, and arthritis. It is also used for other conditions, such as blood disorders and diseases of the adrenal glands. This medicine may be used for other purposes; ask your health care provider or pharmacist if you have questions. COMMON BRAND NAME(S): Medrol, Medrol Dosepak What should I tell my health care  provider before I take this medicine? They need to know if you have any of these conditions: -Cushing's syndrome -eye disease, vision problems -diabetes -glaucoma -heart disease -high blood pressure -infection (especially a virus infection such as chickenpox, cold sores, or herpes) -liver disease -mental illness -myasthenia gravis -osteoporosis -recently received or scheduled to receive a vaccine -seizures -stomach or intestine problems -thyroid disease -an unusual or allergic reaction to lactose, methylprednisolone, other medicines, foods, dyes, or preservatives -pregnant or trying to get pregnant -breast-feeding How should I use this medicine? Take this medicine by mouth with a glass of water. Follow the directions on the prescription label. Take this medicine with food. If you are taking this medicine once a day, take it in the morning. Do not take it more often than directed. Do not suddenly stop taking your medicine because you may develop a severe reaction. Your doctor will tell you how much medicine to take. If your doctor wants you to stop the medicine, the dose may be slowly lowered over time to avoid any side effects. Talk to your pediatrician regarding the use of this medicine in children. Special care may be needed. Overdosage: If you think you have taken too much of this medicine contact a poison control center or emergency room at once. NOTE: This medicine is only for you. Do not share this medicine with others. What if I miss a dose? If you miss a dose, take it as soon as you can. If it is almost time for your next dose, talk to your doctor or health care professional. You may need to miss a dose or take an extra dose. Do not take double or extra doses without advice. What may interact with this medicine? Do not take this medicine with any of the following medications: -alefacept -echinacea -live virus vaccines -metyrapone -mifepristone This medicine may also interact  with the following medications: -amphotericin B -aspirin and aspirin-like medicines -certain antibiotics like erythromycin, clarithromycin, troleandomycin -certain medicines for diabetes -certain medicines for fungal infections like ketoconazole -certain medicines for seizures like carbamazepine, phenobarbital, phenytoin -certain medicines that treat or prevent blood clots like warfarin -cholestyramine -cyclosporine -digoxin -diuretics -female hormones, like estrogens and birth control pills -isoniazid -NSAIDs, medicines for pain inflammation, like ibuprofen or naproxen -other medicines for myasthenia gravis -rifampin -vaccines This list may not describe all possible interactions. Give your health care provider a list of all the medicines, herbs, non-prescription drugs, or dietary supplements you use. Also tell them if you smoke, drink alcohol, or use illegal drugs. Some items may interact with your medicine. What should I watch for while using this medicine? Tell your doctor or healthcare professional if your symptoms do not start to get better or if they get worse. Do not stop taking except on your doctor's  advice. You may develop a severe reaction. Your doctor will tell you how much medicine to take. This medicine may increase your risk of getting an infection. Tell your doctor or health care professional if you are around anyone with measles or chickenpox, or if you develop sores or blisters that do not heal properly. This medicine may affect blood sugar levels. If you have diabetes, check with your doctor or health care professional before you change your diet or the dose of your diabetic medicine. Tell your doctor or health care professional right away if you have any change in your eyesight. Using this medicine for a long time may increase your risk of low bone mass. Talk to your doctor about bone health. What side effects may I notice from receiving this medicine? Side effects that  you should report to your doctor or health care professional as soon as possible: -allergic reactions like skin rash, itching or hives, swelling of the face, lips, or tongue -bloody or tarry stools -changes in vision -hallucination, loss of contact with reality -muscle cramps -muscle pain -palpitations -signs and symptoms of high blood sugar such as dizziness; dry mouth; dry skin; fruity breath; nausea; stomach pain; increased hunger or thirst; increased urination -signs and symptoms of infection like fever or chills; cough; sore throat; pain or trouble passing urine -trouble passing urine or change in the amount of urine Side effects that usually do not require medical attention (report to your doctor or health care professional if they continue or are bothersome): -changes in emotions or mood -constipation -diarrhea -excessive hair growth on the face or body -headache -nausea, vomiting -trouble sleeping -weight gain This list may not describe all possible side effects. Call your doctor for medical advice about side effects. You may report side effects to FDA at 1-800-FDA-1088. Where should I keep my medicine? Keep out of the reach of children. Store at room temperature between 20 and 25 degrees C (68 and 77 degrees F). Throw away any unused medicine after the expiration date. NOTE: This sheet is a summary. It may not cover all possible information. If you have questions about this medicine, talk to your doctor, pharmacist, or health care provider.  2018 Elsevier/Gold Standard (2015-04-12 15:53:30)

## 2016-06-29 IMAGING — CT CT ABD-PELV W/ CM
2 of 4 series · 16 of 46 positions shown, 18 images · IV contrast (ISOVUE)
Comparison: No recent exams.  Remote CT 10/17/2008

CLINICAL DATA: Right lower quadrant pain and emesis today.

EXAM:
CT ABDOMEN AND PELVIS WITH CONTRAST
TECHNIQUE: Multidetector CT imaging of the abdomen and pelvis was performed
using the standard protocol following bolus administration of
intravenous contrast.
CONTRAST:  100mL 3PVDMW-IPP IOPAMIDOL (3PVDMW-IPP) INJECTION 61%

[Series 2: abd/pel with · axial · 0.79mm/px · z∈[+1132,+1567]mm · 13 of 99 slices shown, 15 images]
[im 6/99  soft-tissue]
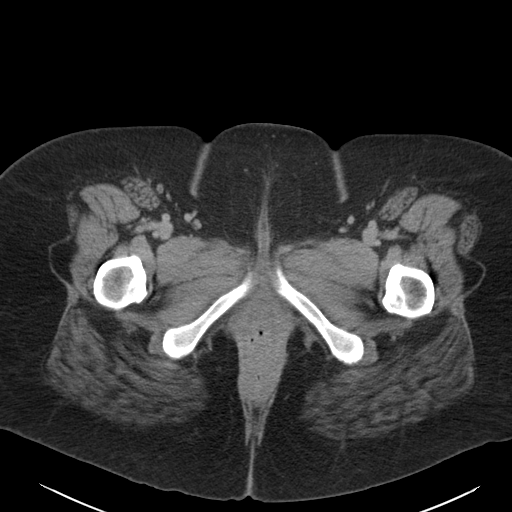
[im 6/99  bone]
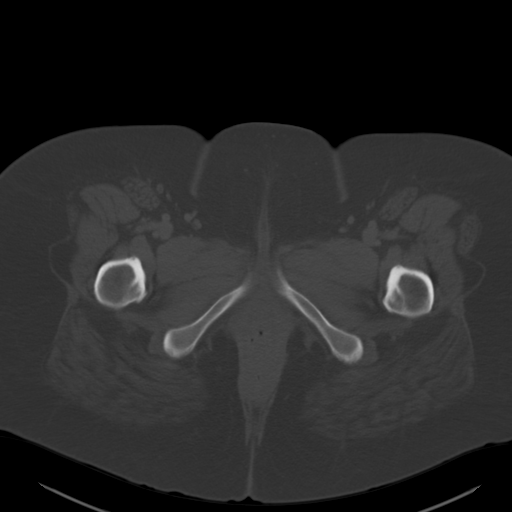
[im 11/99  soft-tissue]
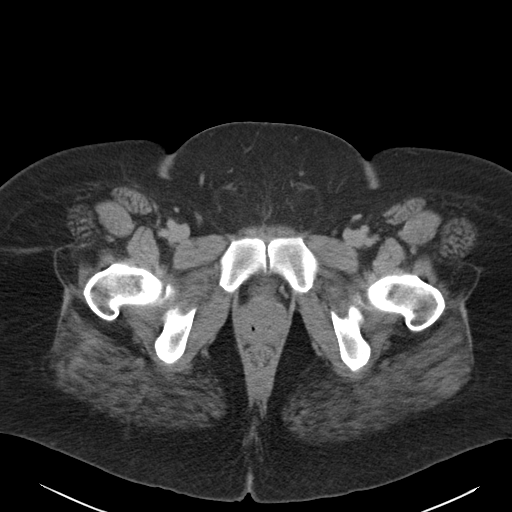
[im 22/99  soft-tissue]
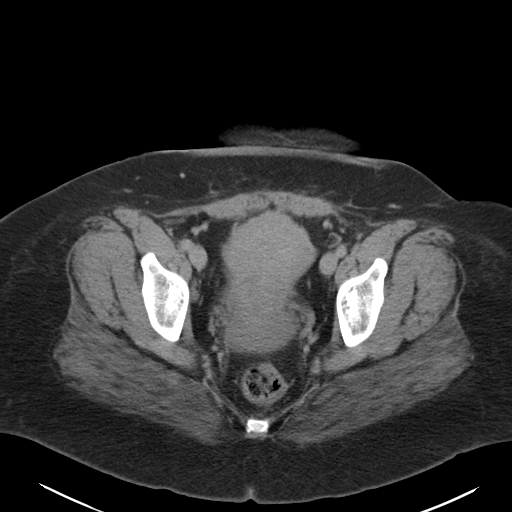
[im 28/99  soft-tissue]
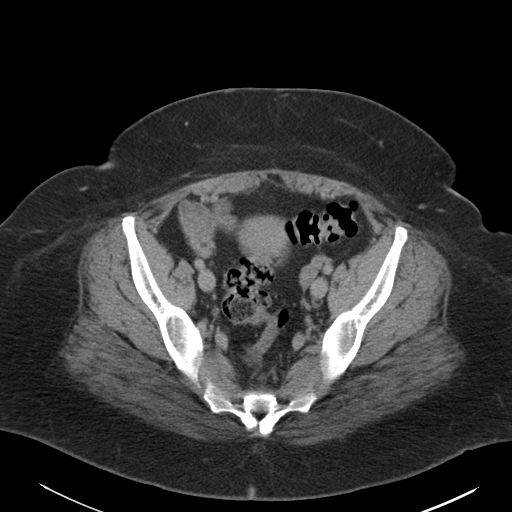
[im 33/99  soft-tissue]
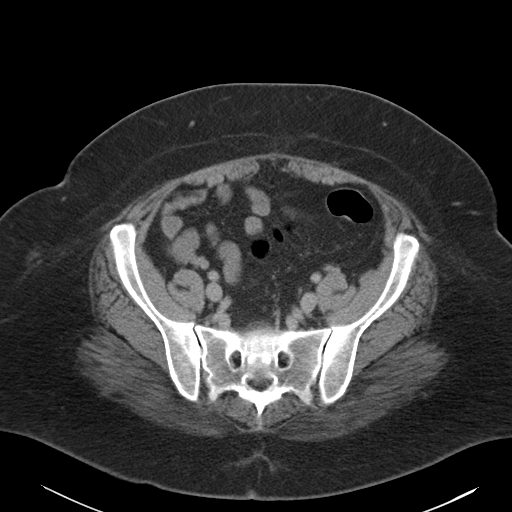
[im 44/99  soft-tissue]
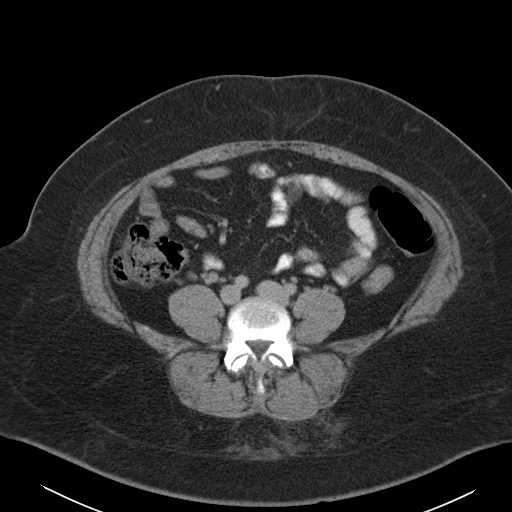
[im 50/99  soft-tissue]
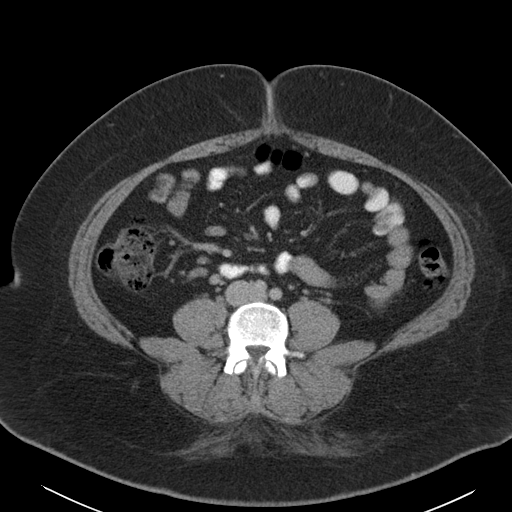
[im 55/99  soft-tissue]
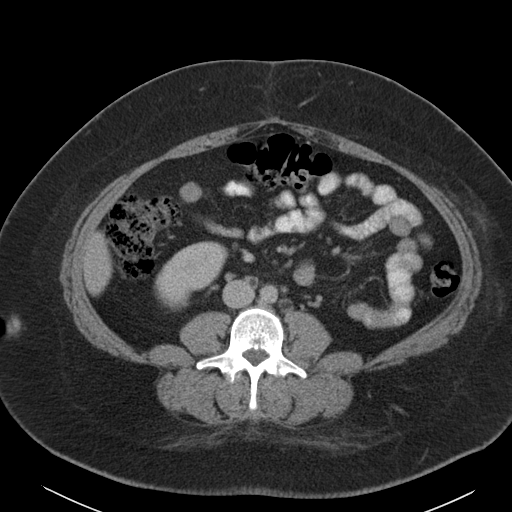
[im 66/99  soft-tissue]
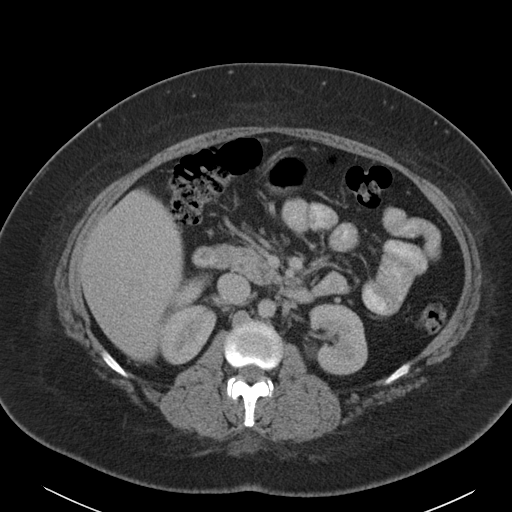
[im 66/99  bone]
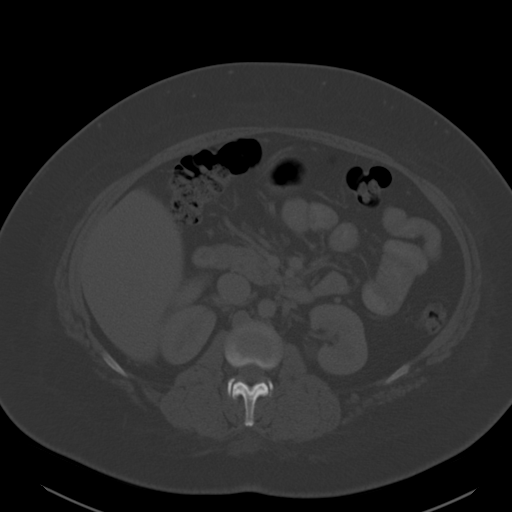
[im 71/99  soft-tissue]
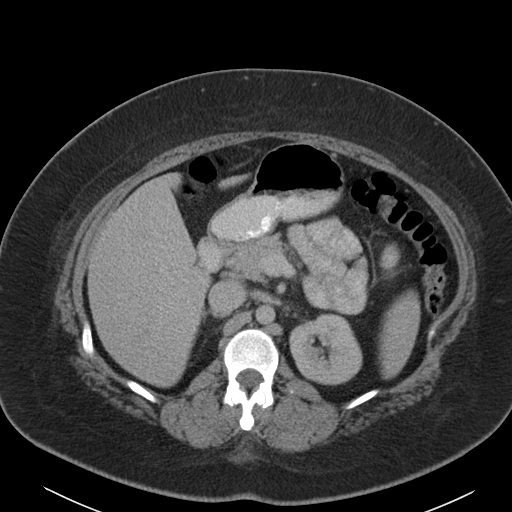
[im 77/99  soft-tissue]
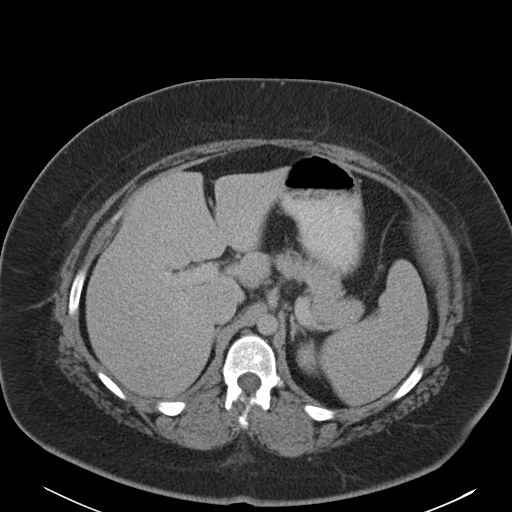
[im 88/99  soft-tissue]
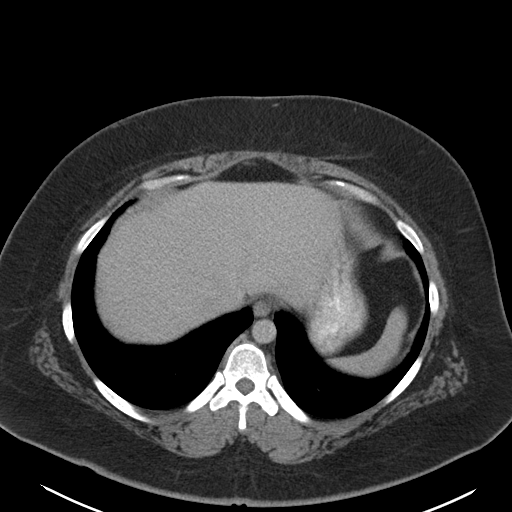
[im 93/99  soft-tissue]
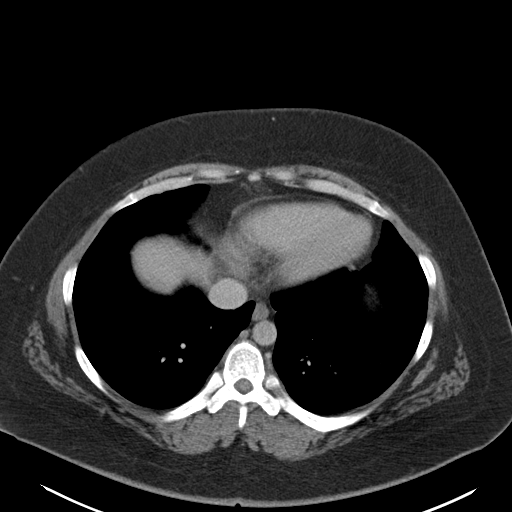

[Series 3: coronal a/|p · coronal · 0.74mm/px · 3 of 177 slices shown]
[im 59/177  soft-tissue]
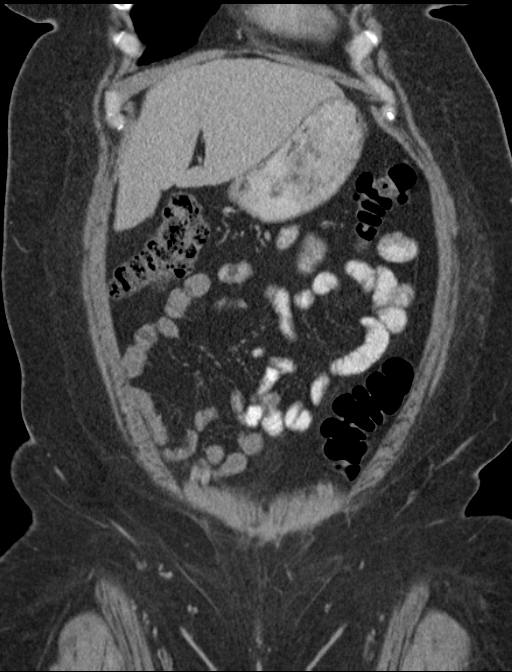
[im 79/177  soft-tissue]
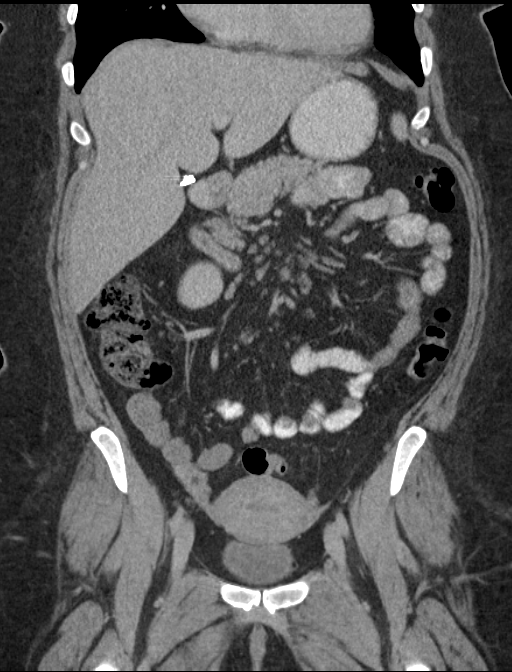
[im 98/177  soft-tissue]
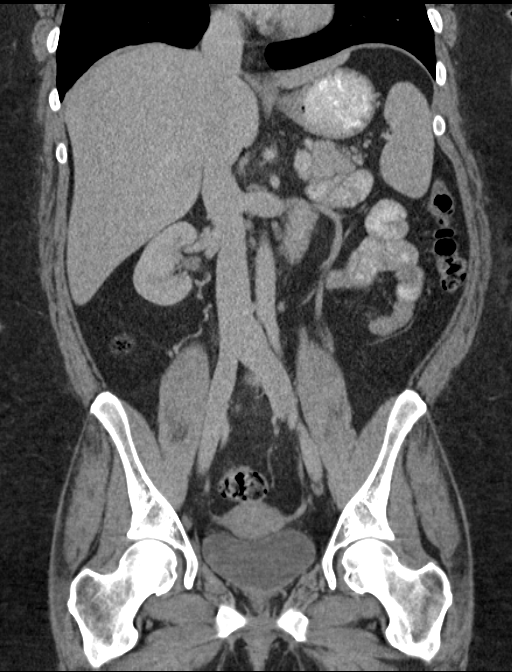

[16 of 46 positions shown; findings below may reference images not displayed]

FINDINGS: Lower chest: The included lung bases are clear. Heart is normal in
size. No pleural effusion.

Liver: No focal lesion. Prominent size measuring 22 cm craniocaudal.

Hepatobiliary: Clips in the gallbladder fossa postcholecystectomy.
No biliary dilatation.

Pancreas: No ductal dilatation or inflammation.

Spleen: Normal.

Adrenal glands: No nodule.

Kidneys: Symmetric renal enhancement. No hydronephrosis. No
perinephric stranding.

Stomach/Bowel: Stomach physiologically distended. There are no
dilated or thickened small bowel loops. Moderate volume of stool
throughout the colon without colonic wall thickening. The appendix
is well-visualized and normal.

Vascular/Lymphatic: No retroperitoneal adenopathy. Abdominal aorta
is normal in caliber.

Reproductive: Uterus normal in size. Ovaries symmetric in size. Mild
prominence of the right ovarian veins without definite adnexal
enhancement.

Bladder: Physiologically distended, no wall thickening.

Other: No free air, free fluid, or intra-abdominal fluid collection.

Musculoskeletal: There are no acute or suspicious osseous
abnormalities.
IMPRESSION: 1. Normal appendix.
2. Mild prominence of the right ovarian veins, can be seen with
pelvic congestion. There is otherwise no acute abnormality.

## 2016-07-19 ENCOUNTER — Emergency Department (HOSPITAL_COMMUNITY)
Admission: EM | Admit: 2016-07-19 | Discharge: 2016-07-19 | Disposition: A | Discharge status: Home / Self Care | Payer: Medicaid Other | Attending: Emergency Medicine | Admitting: Emergency Medicine

## 2016-07-19 ENCOUNTER — Encounter (HOSPITAL_COMMUNITY): Payer: Self-pay | Admitting: Oncology

## 2016-07-19 DIAGNOSIS — K0889 Other specified disorders of teeth and supporting structures: Secondary | ICD-10-CM | POA: Diagnosis present

## 2016-07-19 DIAGNOSIS — K047 Periapical abscess without sinus: Secondary | ICD-10-CM | POA: Insufficient documentation

## 2016-07-19 DIAGNOSIS — Z79899 Other long term (current) drug therapy: Secondary | ICD-10-CM | POA: Diagnosis not present

## 2016-07-19 MED ORDER — AMOXICILLIN-POT CLAVULANATE 875-125 MG PO TABS: 1.0000 | ORAL_TABLET | Freq: Two times a day (BID) | ORAL | 0 refills | Status: DC

## 2016-07-19 NOTE — Discharge Instructions (Signed)
You have a tiny dental abscess. Please take prescribed antibiotics. For pain you may take 600 mg of ibuprofen +1000 mg of Tylenol every 8 hours.  Please contact your dentist in follow-up within 1 week for reevaluation. If you do not have a dentist or cannot be scheduled for a dental exam, please see attached dental resource guide.  Return to the emergency department if symptoms worsen or if you notices worsening facial pain, difficulty opening or closing her jaw, drooling, fevers.

## 2016-07-19 NOTE — ED Provider Notes (Signed)
[facility] DEPT Provider Note   CSN: [id number] Arrival date & time: 07/19/16  2029     History   Chief Complaint Chief Complaint  Patient presents with  . Dental Pain    HPI Martha Mathews is a 30 y.o. female presents to the ED with gradually worsening left upper and lower dental pain that started last night associated with mild left-sided facial swelling. Patient also reports she feels "funny", cannot exactly elaborate but notes that she has a headache and is feeling a little bit nauseated. No associated fevers, cough, nasal congestion or sore throat, chest pain, shortness of breath, vomiting or abdominal pain. No urinary symptoms. Patient does have a dentist at [facility]. No trismus, drooling, throat tightness or changes in voice.  HPI  Past Medical History:  Diagnosis Date  . Abnormal Pap smear   . Anemia    HISTORY W/2006 PREGNANCY  . Anxiety   . Bipolar 1 disorder ([place])   . 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 Chlamydia 2004  . Depression    not currently on meds, being treated- trying to find a med  . Dyslexia   . Hypertelorism   . Migraines    otc med prn w/pregnancy  . 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 Ringworm   . Urinary tract infection     Patient Active Problem List   Diagnosis Date Noted  . Vertigo--positional 09/14/2012  . Unplanned wanted pregnancy 09/04/2011  . Chiari I malformation ([place]) 05/30/2011  . Preventative health care 10/23/2010  . Headache(784.0) 08/26/2010  . MAJOR DEPRESSIVE DISORDER RECURRENT EPISODE MILD 09/21/2007    Past Surgical History:  Procedure Laterality Date  . BILATERAL SALPINGECTOMY Bilateral 04/21/2012   Procedure: BILATERAL SALPINGECTOMY;  Surgeon: Cheri Fowler, MD;  Location: [facility];  Service: Obstetrics;  Laterality: Bilateral;  . CESAREAN SECTION    . CESAREAN SECTION N/A 04/21/2012   Procedure: CESAREAN SECTION;  Surgeon: Cheri Fowler, MD;  Location: [facility];  Service: Obstetrics;   Laterality: N/A;  Repeat  . CHOLECYSTECTOMY  2006  . EYE SURGERY     left eye surgery - lazy eye  . INDUCED ABORTION     vaccum on oct 2009  . MYRINGOTOMY    . TONSILLECTOMY      OB History    Gravida Para Term Preterm AB Living   3 2 2  0 1 2   SAB TAB Ectopic Multiple Live Births     1     2       Home Medications    Prior to Admission medications   Medication Sig Start Date End Date Taking? Authorizing Provider  amoxicillin-clavulanate (AUGMENTIN) 875-125 MG tablet Take 1 tablet by mouth every 12 (twelve) hours. 07/19/16   Kinnie Feil, PA-C  clotrimazole-betamethasone (LOTRISONE) cream Apply 1 application topically 2 (two) times daily.    [provider]  cyclobenzaprine (FLEXERIL) 5 MG tablet Take 1 tablet (5 mg total) by mouth every 8 (eight) hours as needed for headache 05/26/16   Melvenia Beam, MD  fluconazole (DIFLUCAN) 100 MG tablet Take 100 mg by mouth daily.    [provider]  ibuprofen (ADVIL,MOTRIN) 200 MG tablet Take 200 mg by mouth every 6 (six) hours as needed.    [provider]  methylPREDNISolone (MEDROL DOSEPAK) 4 MG TBPK tablet follow package directions 05/26/16   Melvenia Beam, MD  SAPHRIS 5 MG SUBL 24 hr tablet PLACE 1  TABLET UNDER TONGUE AT BEDTIME (MAY USE 1/2 TAB DURING DAY AS NEEDED FOR ANXIETY) 05/14/16   [provider]  SUMAtriptan (IMITREX) 100 MG tablet Take 1 tablet (100 mg total) by mouth once as needed. May repeat in 2 hours if headache persists or recurs. 05/26/16   Melvenia Beam, MD  topiramate (TOPAMAX) 100 MG tablet Take 1.5 tablets (150 mg total) by mouth at bedtime. 05/26/16   Melvenia Beam, MD    Family History Family History  Problem Relation Age of Onset  . Cancer Maternal Grandmother        Breast cancer  . Cancer Paternal Grandmother        Breast cancer  . Stroke Paternal Grandmother 55  . Diabetes Cousin   . Hypertension Cousin   . Hypertension Father   . Depression Father   .  Other Father        radiation for cysts on brain  . Diabetes Mother   . Depression Mother     Social History Social History  Substance Use Topics  . Smoking status: Never Smoker  . Smokeless tobacco: Never Used  . Alcohol use No     Comment: socially but none with pregnancy     Allergies   Hydrocodone-acetaminophen and Adhesive [tape]   Review of Systems Review of Systems  Constitutional: Negative for fever.  HENT: Positive for dental problem. Negative for congestion and sore throat.   Respiratory: Negative for cough and shortness of breath.   Cardiovascular: Negative for chest pain.  Gastrointestinal: Positive for nausea. Negative for abdominal pain, constipation, diarrhea and vomiting.  Genitourinary: Negative for difficulty urinating.  Musculoskeletal: Negative for myalgias.  Skin: Negative for color change.  Neurological: Positive for headaches.     Physical Exam Updated Vital Signs BP (!) 147/93 (BP Location: Right Arm)   Pulse 82   Temp 98 F (36.7 C) (Oral)   Resp 20   LMP 06/21/2016 (Exact Date)   SpO2 97%   Physical Exam  Constitutional: She is oriented to person, place, and time. She appears well-developed and well-nourished. No distress.  NAD.  HENT:  Head: Normocephalic and atraumatic.  Right Ear: External ear normal.  Left Ear: External ear normal.  Nose: Nose normal.  Mouth/Throat: Oropharynx is clear and moist. Abnormal dentition. Dental abscesses present. No oropharyngeal exudate, posterior oropharyngeal edema, posterior oropharyngeal erythema or tonsillar abscesses.    Poor dentition.  Tenderness at teeth #13 and #20.   There is mild surrounding surrounding erythema, edema, fluctuance around tooth #20 gum line, scant amount of purulent discharge expressed with light pressure.  No facial or anterior neck edema, erythema or erythema. No sublingual edema or tenderness.  Soft palate flat without tenderness.  No trismus.   No pooling of oral  secretions.  Phonation normal, no hot potato voice.  Maxilla and mandible nontender. Mastoids without edema, erythema or tenderness.    Eyes: Conjunctivae and EOM are normal. Pupils are equal, round, and reactive to light. No scleral icterus.  Neck: Normal range of motion. Neck supple.  Cardiovascular: Normal rate, regular rhythm and normal heart sounds.   No murmur heard. Pulmonary/Chest: Effort normal and breath sounds normal. She has no wheezes.  Musculoskeletal: Normal range of motion. She exhibits no deformity.  Lymphadenopathy:    She has no cervical adenopathy.  Neurological: She is alert and oriented to person, place, and time.  Skin: Skin is warm and dry. Capillary refill takes less than 2 seconds.  Psychiatric: She has  a normal mood and affect. Her behavior is normal. Judgment and thought content normal.  Nursing note and vitals reviewed.    ED Treatments / Results  Labs (all labs ordered are listed, but only abnormal results are displayed) Labs Reviewed - No data to display  EKG  EKG Interpretation None       Radiology No results found.  Procedures Procedures (including critical care time)  Medications Ordered in ED Medications - No data to display   Initial Impression / Assessment and Plan / ED Course  I have reviewed the triage vital signs and the nursing notes.  Pertinent labs & imaging results that were available during my care of the patient were reviewed by me and considered in my medical decision making (see chart for details).     Dental pain associated with dental abscess.  On exam with patient afebrile, non toxic appearing, swallowing secretions well without hot potato voice. Exam unconcerning for Ludwig's angina or other deep tissue infection in neck. As there is gum swelling with fluctuance, erythema, and facial swelling, will treat with antibiotic and high dose NSAIDs. Urged patient to follow-up with dentist.  Patient has a dentist to f/u with,  advised to follow up in 1-2 days for ultimate management of dental pain and overall dental health. Strict ED return precautions given. Pt is aware of red flag symptoms that would warrant return to ED for re-evaluation and further treatment. Patient voices understanding and is agreeable to plan.  Final Clinical Impressions(s) / ED Diagnoses   Final diagnoses:  Dental abscess    New Prescriptions New Prescriptions   AMOXICILLIN-CLAVULANATE (AUGMENTIN) 875-125 MG TABLET    Take 1 tablet by mouth every 12 (twelve) hours.     Kinnie Feil, PA-C 07/19/16 2110    Tanna Furry, MD 08/01/16 [phone]

## 2016-07-19 NOTE — ED Triage Notes (Signed)
Pt c/o left sided upper/lower dental pain.  Pain started last night.  Pt states that she feels, "Funny."  Cannot elaborate.  Pt rates pain 8/10, throbbing in nature.

## 2016-07-20 ENCOUNTER — Encounter (HOSPITAL_COMMUNITY): Payer: Self-pay | Admitting: Emergency Medicine

## 2016-07-20 ENCOUNTER — Emergency Department (HOSPITAL_COMMUNITY)
Admission: EM | Admit: 2016-07-20 | Discharge: 2016-07-20 | Disposition: A | Discharge status: Home / Self Care | Payer: Medicaid Other | Attending: Emergency Medicine | Admitting: Emergency Medicine

## 2016-07-20 DIAGNOSIS — Z79899 Other long term (current) drug therapy: Secondary | ICD-10-CM | POA: Diagnosis not present

## 2016-07-20 DIAGNOSIS — K0889 Other specified disorders of teeth and supporting structures: Secondary | ICD-10-CM | POA: Insufficient documentation

## 2016-07-20 MED ORDER — HYDROCODONE-ACETAMINOPHEN 5-325 MG PO TABS
1.0000 | ORAL_TABLET | Freq: Once | ORAL | Status: AC
  Administered 2016-07-20: 1 via ORAL

## 2016-07-20 MED ORDER — HYDROCODONE-ACETAMINOPHEN 5-325 MG PO TABS
1.0000 | ORAL_TABLET | Freq: Once | ORAL | Status: DC
  Filled 2016-07-20: qty 1

## 2016-07-20 MED ORDER — HYDROCODONE-ACETAMINOPHEN 5-325 MG PO TABS: 2.0000 | ORAL_TABLET | Freq: Four times a day (QID) | ORAL | 0 refills | Status: DC | PRN

## 2016-07-20 NOTE — Discharge Instructions (Signed)
Continue taking antibiotics as prescribed. Take pain medication as needed if Tylenol and ibuprofen do not help. Schedule appointment with dentist as soon as possible. Return to ED for worsening pain, trouble breathing, trouble swallowing, vision changes, fever, increased drainage or bleeding.

## 2016-07-20 NOTE — ED Triage Notes (Signed)
Pt reports having upper and lower dental pain that has began to worsen since being evaluated last night. Pt states pain radiates into ears. Pt reports taking prescribed abx as directed.

## 2016-07-20 NOTE — ED Notes (Signed)
Pt states her husband Martha Mathews will be driving her home.

## 2016-07-21 ENCOUNTER — Encounter: Payer: Self-pay | Admitting: Adult Health

## 2016-07-21 ENCOUNTER — Ambulatory Visit (INDEPENDENT_AMBULATORY_CARE_PROVIDER_SITE_OTHER): Payer: Medicaid Other | Admitting: Adult Health

## 2016-07-21 VITALS — BP 123/90 | HR 72 | Ht 66.0 in | Wt 289.8 lb

## 2016-07-21 DIAGNOSIS — R51 Headache: Secondary | ICD-10-CM | POA: Diagnosis not present

## 2016-07-21 DIAGNOSIS — R519 Headache, unspecified: Secondary | ICD-10-CM

## 2016-07-21 DIAGNOSIS — G43719 Chronic migraine without aura, intractable, without status migrainosus: Secondary | ICD-10-CM

## 2016-07-21 MED ORDER — TOPIRAMATE 100 MG PO TABS: 200.0000 mg | ORAL_TABLET | Freq: Every day | ORAL | 11 refills | Status: DC

## 2016-07-21 NOTE — Patient Instructions (Addendum)
Increase Topamax to 150 mg daily Continue imitrex Keep headache Journal Keep Sleep Appointment If your symptoms worsen or you develop new symptoms please let us know.

## 2016-07-21 NOTE — ED Provider Notes (Signed)
[facility] DEPT Provider Note   CSN: [id number] Arrival date & time: 07/20/16  2012     History   Chief Complaint Chief Complaint  Patient presents with  . Dental Pain    HPI Martha Mathews is a 31 y.o. female.  HPI  Patient presents with L sided dental pain that began 2 days ago. She was seen yesterday and was given antibiotics which she states have not been helping with pain. She reports taking 2 doses so far as directed. She is continuing to use Tylenol and ibuprofen around the clock for pain but nothing helps. She reports pain radiates to her ears now and rates it 9/10. Denies fever, trouble breathing, trouble swallowing, drooling, hoarseness, rashes, chest pain, vomiting, drainage or bleeding from teeth. No new injuries or trauma to the areas.  Past Medical History:  Diagnosis Date  . Abnormal Pap smear   . Anemia    HISTORY W/2006 PREGNANCY  . Anxiety   . Bipolar 1 disorder ([place])   . 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 Chlamydia 2004  . Depression    not currently on meds, being treated- trying to find a med  . Dyslexia   . Hypertelorism   . Migraines    otc med prn w/pregnancy  . 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 Ringworm   . Urinary tract infection     Patient Active Problem List   Diagnosis Date Noted  . Vertigo--positional 09/14/2012  . Unplanned wanted pregnancy 09/04/2011  . Chiari I malformation ([place]) 05/30/2011  . Preventative health care 10/23/2010  . Headache(784.0) 08/26/2010  . MAJOR DEPRESSIVE DISORDER RECURRENT EPISODE MILD 09/21/2007    Past Surgical History:  Procedure Laterality Date  . BILATERAL SALPINGECTOMY Bilateral 04/21/2012   Procedure: BILATERAL SALPINGECTOMY;  Surgeon: Cheri Fowler, MD;  Location: [facility];  Service: Obstetrics;  Laterality: Bilateral;  . CESAREAN SECTION    . CESAREAN SECTION N/A 04/21/2012   Procedure: CESAREAN SECTION;  Surgeon: Cheri Fowler, MD;  Location: [facility];   Service: Obstetrics;  Laterality: N/A;  Repeat  . CHOLECYSTECTOMY  2006  . EYE SURGERY     left eye surgery - lazy eye  . INDUCED ABORTION     vaccum on oct 2009  . MYRINGOTOMY    . TONSILLECTOMY      OB History    Gravida Para Term Preterm AB Living   3 2 2  0 1 2   SAB TAB Ectopic Multiple Live Births     1     2       Home Medications    Prior to Admission medications   Medication Sig Start Date End Date Taking? Authorizing Provider  amoxicillin-clavulanate (AUGMENTIN) 875-125 MG tablet Take 1 tablet by mouth every 12 (twelve) hours. 07/19/16   Kinnie Feil, PA-C  butalbital-acetaminophen-caffeine (FIORICET WITH CODEINE) [phone] MG capsule TAKE ONE CAPSULE BY MOUTH AT ONSET OF HEADACHE 06/19/16   [provider]  clonazePAM (KLONOPIN) 0.5 MG tablet TAKE 1 TABLET AS DIRECTED 30 MIN BEFORE RIDING IN A CAR,MAY USE AS NEEDED 1 TIME PER DAY FOR PANIC 06/27/16   [provider]  clotrimazole-betamethasone (LOTRISONE) cream Apply 1 application topically 2 (two) times daily.    [provider]  cyclobenzaprine (FLEXERIL) 5 MG tablet Take 1 tablet (5 mg total) by mouth every 8 (eight) hours as needed for headache 05/26/16   Melvenia Beam, MD  fluconazole (DIFLUCAN) 100  MG tablet Take 100 mg by mouth daily.    [provider]  FLUoxetine (PROZAC) 20 MG capsule TAKE 1 CAPSULE BY MOUTH ONCE A DAY FOR MOOD 06/26/16   [provider]  HYDROcodone-acetaminophen (NORCO/VICODIN) 5-325 MG tablet Take 2 tablets by mouth every 6 (six) hours as needed for severe pain. 07/20/16   Analysa Nutting, PA-C  ibuprofen (ADVIL,MOTRIN) 200 MG tablet Take 200 mg by mouth every 6 (six) hours as needed.    [provider]  ondansetron (ZOFRAN) 8 MG tablet TAKE 1 TABLET BY MOUTH EVERY 4 TO 6 HOURS AS NEEDED FOR NAUSEA 06/19/16   [provider]  SAPHRIS 5 MG SUBL 24 hr tablet PLACE 1 TABLET UNDER TONGUE AT BEDTIME (MAY USE 1/2 TAB DURING DAY AS NEEDED FOR  ANXIETY) 05/14/16   [provider]  SUMAtriptan (IMITREX) 100 MG tablet Take 1 tablet (100 mg total) by mouth once as needed. May repeat in 2 hours if headache persists or recurs. 05/26/16   Melvenia Beam, MD  topiramate (TOPAMAX) 100 MG tablet Take 2 tablets (200 mg total) by mouth at bedtime. 07/21/16   Ward Givens, NP    Family History Family History  Problem Relation Age of Onset  . Cancer Maternal Grandmother        Breast cancer  . Cancer Paternal Grandmother        Breast cancer  . Stroke Paternal Grandmother 53  . Diabetes Cousin   . Hypertension Cousin   . Hypertension Father   . Depression Father   . Other Father        radiation for cysts on brain  . Diabetes Mother   . Depression Mother     Social History Social History  Substance Use Topics  . Smoking status: Never Smoker  . Smokeless tobacco: Never Used  . Alcohol use No     Comment: socially but none with pregnancy     Allergies   Hydrocodone-acetaminophen and Adhesive [tape]   Review of Systems Review of Systems  Constitutional: Negative for appetite change, chills and fever.  HENT: Positive for dental problem and ear pain. Negative for drooling, facial swelling, sinus pain, sore throat, trouble swallowing and voice change.   Eyes: Negative for pain and visual disturbance.  Respiratory: Negative for cough, choking, shortness of breath, wheezing and stridor.   Cardiovascular: Negative for chest pain.  Musculoskeletal: Negative for neck pain.  Skin: Negative for rash.     Physical Exam Updated Vital Signs BP (!) 168/72 (BP Location: Left Arm)   Pulse 97   Temp 98.3 F (36.8 C) (Oral)   Resp 20   Ht 5\' 6"  (1.676 m)   Wt 129.7 kg (286 lb)   LMP 06/21/2016 (Exact Date)   SpO2 100%   BMI 46.16 kg/m   Physical Exam  Constitutional: She appears well-developed and well-nourished. No distress.  HENT:  Head: Normocephalic and atraumatic.  Right Ear: Tympanic membrane is erythematous.   Left Ear: Tympanic membrane normal.  Mouth/Throat: Uvula is midline and oropharynx is clear and moist.    There is tenderness at indicated teeth but no evidence of discharge or bleeding noted. There are 2 missing teeth near indicated areas as well. No swelling or tenderness of neck or jaw. No color change or erythema noted. No drooling noted. Patient is tolerating secretions. Able to open mouth, no trismus. Normal voice noted. No evidence of extension of infection into deeper tissues.   Eyes: Conjunctivae and EOM are normal.  No scleral icterus.  Neck: Normal range of motion.  Cardiovascular: Normal rate and regular rhythm.   Pulmonary/Chest: Effort normal. No respiratory distress.  Neurological: She is alert.  Skin: No rash noted. She is not diaphoretic.  Psychiatric: She has a normal mood and affect.  Nursing note and vitals reviewed.    ED Treatments / Results  Labs (all labs ordered are listed, but only abnormal results are displayed) Labs Reviewed - No data to display  EKG  EKG Interpretation None       Radiology No results found.  Procedures Procedures (including critical care time)  Medications Ordered in ED Medications  HYDROcodone-acetaminophen (NORCO/VICODIN) 5-325 MG per tablet 1 tablet (1 tablet Oral Given 07/20/16 2059)     Initial Impression / Assessment and Plan / ED Course  I have reviewed the triage vital signs and the nursing notes.  Pertinent labs & imaging results that were available during my care of the patient were reviewed by me and considered in my medical decision making (see chart for details).     Patient presents with dental pain. She was seen here in the ED yesterday and given Augmentin, which she states she has taken 2 doses for already. Patient denies any new symptoms from yesterday, aside from pain not alleviated with Tylenol and ibuprofen and ear pain. R TM mildly erythematous but L TM with no abnormalities. Patient is afebrile with  no history of fever. There is mild swelling at the gumline, with no apparent change from the description from her ED visit yesterday. No bleeding or drainage noted. No concern for evidence of Ludwig's angina or extension of infection into deeper tissues of neck. No changes in voice and patient is tolerating secretions.  She states she will not be able to see her dentist until next week because they do not have any earlier appointments. She is worried that the pain will not be controlled until then. I advised her to follow up with the dentist on call today. Patient given Norco for pain control. Although listed under allergies, she states she has taken it before but experiences "itching in my nose" which resolves with Benadryl and she is willing to use it. I advised her to take the medication with Benadryl, but advised her to continue Tylenol and ibuprofen initially and Norco only as needed, and to follow up with the dentist. Patient appears stable for discharge at this time. Strict return precautions given.  Final Clinical Impressions(s) / ED Diagnoses   Final diagnoses:  Pain, dental    New Prescriptions Discharge Medication List as of 07/20/2016  8:59 PM    START taking these medications   Details  HYDROcodone-acetaminophen (NORCO/VICODIN) 5-325 MG tablet Take 2 tablets by mouth every 6 (six) hours as needed for severe pain., Starting Sun 07/20/2016, Print         Shelly Coss, [place], PA-C 07/21/16 1447    Davonna Belling, MD 07/21/16 [phone]

## 2016-07-21 NOTE — Progress Notes (Signed)
PATIENT: Martha Mathews DOB: 07-17-1985  REASON FOR VISIT: follow up-chronic migraines, Chiari malformation HISTORY FROM: patient  HISTORY OF PRESENT ILLNESS: Martha Mathews is a 31 year old female with a history of chronic migraines and Chiari malformation. She returns today for follow-up. At the last visit Topamax was increased to 150 mg as well as Imitrex to 100 mg. She was also referred to neurosurgery for evaluation of her Chiari malformation.  She reports that her headaches have remained the same. She states that she wakes up every morning with a headache. She states that she typically using Imitrex or Fioricet but this doesn't always resolve the headache.He typically just eases it off. Fioricet was recently prescribed by her PCP. [name] She reports that she has 1-2 migraines throughout the day. Her headaches typically occur in the back of the head. She denies photophobia and phonophobia. She does note that since she increased Topamax she has noticed that the headaches that she may get throughout the day has decreased in severity and longevity. She reports that she is experiencing dizziness not always when she has a headache. She describes the dizziness as a room spinning sensation. She reports that it occurs when she lays down. She did follow up with neurosurgery Dr. Jacolyn Reedy  who did not see the need for surgery based on her MRI. Patient reports that she does snore very loudly. She has a sleep evaluation June 18 with Dr. Rexene Alberts. She returns today for an evaluation.  HISTORY 05/26/16: Martha Mathews is a 31 y.o. female here as a referral from Dr. Alphonzo Grieve for chronic migraines. She is obese and has had headaches since a child. When she wakes up she has a very bad migraine on the top of her head. She has daily headaches in the occipital areas. She gets dizzy, liht headed, ears feel numb. Does not increase with valsalva. She smells heavily of smoke today bit says she does not smoke. She has known about  the chiari since the third grade. The headaches she wakes with are tightness, hurts to open eyes, every morning, she has "swimminess" and room spinning. It is severe, she feels her head is heavy. She snores heavily.  She was recently started on Imitrex and Topiramate. She is on 100mg  daily Topiramate. In addition to the headaches she has in the morning she also has daily headaches where she is dizzy, room spinning, in the occipital area, balance is off, no light or sound sensitivity, no sensitivity to smells just pain in the back of her head with the associated vertigo and dizziness. Unknown triggers. Better in a dark cool room, she gets very hot and needs a fan. She describes the pain in the occipital area as squeezing and pressure not throbbing or pounding.She feels the pain when swallowing but not with coughing or straining. She has vision changes with blurry vision. She also wakes often at night and is fatigued during during the day. No other focal neurologic deficits, associated symptoms, inciting events or modifiable factors.  Reviewed notes, labs and imaging from outside physicians, which showed:  Reviewed lab values, cbc and bmp unremarkable 11/2015  Reviewed primary care notes. Patient is here for chronic headaches and Chiari I malformation. She is already on Topamax 100 mg daily as well as Imitrex 25 mg. She's had headaches since a young age. She was on Imitrex years ago. Daily headaches. The headaches are located occipitally. She has nausea, vomiting, dizziness and photosensitivity with the headaches. They usually last 1 hour but less  if she goes into a dark room. She denies syncope or chest pain. She has apparently had an MRI of the brain April 28 which showed a Chiari I malformation but I don't have those images(She had images done at [facility]).   Personally reviewed MRI images of the brain from 2012 and 2006 which showed a chiari malformation of 51mm with crowding of the cisterna magna.  Reviewed report from 2001 which showed 10-45mm ectopia (no images to review that far back)   REVIEW OF SYSTEMS: Out of a complete 14 system review of symptoms, the patient complains only of the following symptoms, and all other reviewed systems are negative.  Dizziness, headache, depression, nervous/anxious, insomnia, daytime sleepiness, snoring, blurred vision  ALLERGIES: Allergies  Allergen Reactions  . Hydrocodone-Acetaminophen     REACTION: rash and itching  . Adhesive [Tape] Hives, Rash and Other (See Comments)    redness    HOME MEDICATIONS: Outpatient Medications Prior to Visit  Medication Sig Dispense Refill  . amoxicillin-clavulanate (AUGMENTIN) 875-125 MG tablet Take 1 tablet by mouth every 12 (twelve) hours. 14 tablet 0  . clotrimazole-betamethasone (LOTRISONE) cream Apply 1 application topically 2 (two) times daily.    . cyclobenzaprine (FLEXERIL) 5 MG tablet Take 1 tablet (5 mg total) by mouth every 8 (eight) hours as needed for headache 60 tablet 6  . fluconazole (DIFLUCAN) 100 MG tablet Take 100 mg by mouth daily.    [name] HYDROcodone-acetaminophen (NORCO/VICODIN) 5-325 MG tablet Take 2 tablets by mouth every 6 (six) hours as needed for severe pain. 10 tablet 0  . ibuprofen (ADVIL,MOTRIN) 200 MG tablet Take 200 mg by mouth every 6 (six) hours as needed.    [name] SAPHRIS 5 MG SUBL 24 hr tablet PLACE 1 TABLET UNDER TONGUE AT BEDTIME (MAY USE 1/2 TAB DURING DAY AS NEEDED FOR ANXIETY)  1  . SUMAtriptan (IMITREX) 100 MG tablet Take 1 tablet (100 mg total) by mouth once as needed. May repeat in 2 hours if headache persists or recurs. 10 tablet 12  . topiramate (TOPAMAX) 100 MG tablet Take 1.5 tablets (150 mg total) by mouth at bedtime. 45 tablet 11  . methylPREDNISolone (MEDROL DOSEPAK) 4 MG TBPK tablet follow package directions (Patient not taking: Reported on 07/21/2016) 21 tablet 1   No facility-administered medications prior to visit.     PAST MEDICAL HISTORY: Past Medical  History:  Diagnosis Date  . Abnormal Pap smear   . Anemia    HISTORY W/2006 PREGNANCY  . Anxiety   . Bipolar 1 disorder ([place])   . 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 Chlamydia 2004  . Depression    not currently on meds, being treated- trying to find a med  . Dyslexia   . Hypertelorism   . Migraines    otc med prn w/pregnancy  . 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 Ringworm   . Urinary tract infection     PAST SURGICAL HISTORY: Past Surgical History:  Procedure Laterality Date  . BILATERAL SALPINGECTOMY Bilateral 04/21/2012   Procedure: BILATERAL SALPINGECTOMY;  Surgeon: Cheri Fowler, MD;  Location: [facility];  Service: Obstetrics;  Laterality: Bilateral;  . CESAREAN SECTION    . CESAREAN SECTION N/A 04/21/2012   Procedure: CESAREAN SECTION;  Surgeon: Cheri Fowler, MD;  Location: [facility];  Service: Obstetrics;  Laterality: N/A;  Repeat  . CHOLECYSTECTOMY  2006  . EYE SURGERY     left eye surgery - lazy  eye  . INDUCED ABORTION     vaccum on oct 2009  . MYRINGOTOMY    . TONSILLECTOMY      FAMILY HISTORY: Family History  Problem Relation Age of Onset  . Cancer Maternal Grandmother        Breast cancer  . Cancer Paternal Grandmother        Breast cancer  . Stroke Paternal Grandmother 22  . Diabetes Cousin   . Hypertension Cousin   . Hypertension Father   . Depression Father   . Other Father        radiation for cysts on brain  . Diabetes Mother   . Depression Mother     SOCIAL HISTORY: Social History   Social History  . Marital status: Married    Spouse name: N/A  . Number of children: 2  . Years of education: 9   Occupational History  . Unemployed    Social History Main Topics  . Smoking status: Never Smoker  . Smokeless tobacco: Never Used  . Alcohol use No     Comment: socially but none with pregnancy  . Drug use: No  . Sexual activity: Yes    Birth control/ protection: Surgical   Other Topics Concern  . Not on  file   Social History Narrative   Lives at ome w/ her husband a/d 2 children   Right-handed   Caffeine: 32 oz per day   Regular exercise- yes      PHYSICAL EXAM  Vitals:   07/21/16 1120  BP: 123/90  Pulse: 72  Weight: 289 lb 12.8 oz (131.5 kg)  Height: 5\' 6"  (1.676 m)   Body mass index is 46.77 kg/m.  Generalized: Well developed, in no acute distress   Neurological examination  Mentation: Alert oriented to time, place, history taking. Follows all commands speech and language fluent Cranial nerve II-XII: Pupils were equal round reactive to light. Extraocular movements were full, visual field were full on confrontational test.2-3 beats of Nystagmus with horizontal gaze to the right Facial sensation and strength were normal. Uvula tongue midline. Head turning and shoulder shrug  were normal and symmetric. Motor: The motor testing reveals 5 over 5 strength of all 4 extremities. Good symmetric motor tone is noted throughout.  Sensory: Sensory testing is intact to soft touch on all 4 extremities. No evidence of extinction is noted.  Coordination: Cerebellar testing reveals good finger-nose-finger and heel-to-shin bilaterally.  Gait and station: Gait is normal. .  Reflexes: Deep tendon reflexes are symmetric and normal bilaterally.   DIAGNOSTIC DATA (LABS, IMAGING, TESTING) - I reviewed patient records, labs, notes, testing and imaging myself where available.  Lab Results  Component Value Date   WBC 8.0 11/20/2015   HGB 12.6 11/20/2015   HCT 40.8 11/20/2015   MCV 81.9 11/20/2015   PLT 291 11/20/2015      Component Value Date/Time   NA 139 11/20/2015 1332   K 3.9 11/20/2015 1332   CL 106 11/20/2015 1332   CO2 25 11/20/2015 1332   GLUCOSE 131 (H) 11/20/2015 1332   BUN 10 11/20/2015 1332   CREATININE 0.79 11/20/2015 1332   CREATININE 0.73 09/14/2012 1653   CALCIUM 8.9 11/20/2015 1332   PROT 6.4 (L) 06/06/2015 1919   ALBUMIN 3.5 06/06/2015 1919   AST 15 06/06/2015 1919     ALT 13 (L) 06/06/2015 1919   ALKPHOS 52 06/06/2015 1919   BILITOT 0.5 06/06/2015 1919   GFRNONAA >60 11/20/2015 1332   GFRNONAA >89  07/25/2011 1615   GFRAA >60 11/20/2015 1332   GFRAA >89 07/25/2011 1615    No results found for: HGBA1C No results found for: VITAMINB12 Lab Results  Component Value Date   TSH 2.120 09/14/2012      ASSESSMENT AND PLAN 31 y.o. year old female  has a past medical history of Abnormal Pap smear; Anemia; Anxiety; Bipolar 1 disorder ([place]); Chiari malformation; Chlamydia (2004); Depression; Dyslexia; Hypertelorism; Migraines; Neisseria gonorrhoeae; Ringworm; and Urinary tract infection. here with :  1. Chronic migraine headaches 2. Morning headache  I advised the patient that her morning headaches could be a result of untreated sleep apnea-this is the reason for the sleep evaluation. It seems that her headaches that occur throughout the day may have slightly improved with Topamax. We will increase Topamax to 200 mg daily. I reviewed side effects of Topamax with the patient. She voiced understanding. She will continue using Imitrex at the onset of her headaches. She is advised that if her symptoms worsen or she develops new symptoms she should let us know. She will follow-up in 3 months or sooner if needed.     Ward Givens, MSN, NP-C 07/21/2016, 11:30 AM [facility] [address] [place], [place] [address] [phone]

## 2016-08-04 ENCOUNTER — Telehealth: Payer: Self-pay

## 2016-08-04 ENCOUNTER — Institutional Professional Consult (permissible substitution): Payer: Medicaid Other | Admitting: Neurology

## 2016-08-04 NOTE — Telephone Encounter (Signed)
Pt did not show for their appt with Dr. Athar today.  

## 2016-08-06 ENCOUNTER — Encounter: Payer: Self-pay | Admitting: Neurology

## 2016-08-19 ENCOUNTER — Institutional Professional Consult (permissible substitution): Payer: Medicaid Other | Admitting: Neurology

## 2016-08-19 NOTE — Telephone Encounter (Signed)
Please follow no show policy for 2 times no show for new eval in sleep clinic, dismissed from sleep clinic.

## 2016-08-19 NOTE — Telephone Encounter (Signed)
Pt has no showed her appt with Dr. Rexene Alberts today as well. Per [facility] policy, pt has no showed 2 sleep consults, and this is grounds for dismissal from the sleep clinic.

## 2016-08-21 ENCOUNTER — Encounter: Payer: Self-pay | Admitting: Neurology

## 2016-09-10 ENCOUNTER — Institutional Professional Consult (permissible substitution): Payer: Medicaid Other | Admitting: Neurology

## 2016-10-21 ENCOUNTER — Ambulatory Visit: Payer: Medicaid Other | Admitting: Adult Health

## 2016-11-07 ENCOUNTER — Emergency Department (HOSPITAL_COMMUNITY)
Admission: EM | Admit: 2016-11-07 | Discharge: 2016-11-07 | Disposition: A | Discharge status: Home / Self Care | Payer: Self-pay | Attending: Emergency Medicine | Admitting: Emergency Medicine

## 2016-11-07 ENCOUNTER — Encounter (HOSPITAL_COMMUNITY): Payer: Self-pay | Admitting: Nurse Practitioner

## 2016-11-07 ENCOUNTER — Emergency Department (HOSPITAL_COMMUNITY): Payer: Self-pay

## 2016-11-07 DIAGNOSIS — J069 Acute upper respiratory infection, unspecified: Secondary | ICD-10-CM | POA: Insufficient documentation

## 2016-11-07 DIAGNOSIS — Q0700 Arnold-Chiari syndrome without spina bifida or hydrocephalus: Secondary | ICD-10-CM | POA: Insufficient documentation

## 2016-11-07 DIAGNOSIS — Z79899 Other long term (current) drug therapy: Secondary | ICD-10-CM | POA: Insufficient documentation

## 2016-11-07 DIAGNOSIS — B9789 Other viral agents as the cause of diseases classified elsewhere: Secondary | ICD-10-CM | POA: Insufficient documentation

## 2016-11-07 DIAGNOSIS — Q752 Hypertelorism: Secondary | ICD-10-CM | POA: Insufficient documentation

## 2016-11-07 MED ORDER — BENZONATATE 100 MG PO CAPS: 100.0000 mg | ORAL_CAPSULE | Freq: Three times a day (TID) | ORAL | 0 refills | Status: DC

## 2016-11-07 MED ORDER — GUAIFENESIN-CODEINE 100-10 MG/5ML PO SOLN
10.0000 mL | Freq: Once | ORAL | Status: AC
  Administered 2016-11-07: 10 mL via ORAL
  Filled 2016-11-07: qty 10

## 2016-11-07 MED ORDER — IBUPROFEN 400 MG PO TABS: 400.0000 mg | ORAL_TABLET | Freq: Four times a day (QID) | ORAL | 0 refills | Status: DC | PRN

## 2016-11-07 NOTE — ED Notes (Signed)
ED Provider at bedside. 

## 2016-11-07 NOTE — ED Notes (Signed)
Pt ambulatory and independent at discharge.  Verbalized understanding of discharge instructions 

## 2016-11-07 NOTE — Discharge Instructions (Addendum)
°  We think what you have is a viral syndrome - the treatment for which is symptomatic relief only, and your body will fight the infection off in a few days. We are prescribing you some meds for pain and fevers. See your primary care doctor in 1 week if the symptoms dont improve.

## 2016-11-07 NOTE — ED Triage Notes (Signed)
Pt states in the last 48 hrs she has experienced severe cold symptoms. She is congested, coughing a lot and having psotnasal drainage. She also adds that she lost her insurance coverage and as result has not been able to refill her vertigo medicine and feel that the cold may be causing her vertigo.

## 2016-11-07 NOTE — ED Provider Notes (Signed)
[facility] DEPT Provider Note   CSN: [id number] Arrival date & time: 11/07/16  1305     History   Chief Complaint Chief Complaint  Patient presents with  . URI    HPI Martha Mathews is a 31 y.o. female.  HPI SUBJECTIVE:  Martha Mathews is a 31 y.o. female who complains of congestion, sore throat, nasal blockage, post nasal drip, productive cough, cough described as productive of yellow sputum, fever and chills for 2 days. She denies a history of chest pain and has a history of COPD. Patient denies smoke cigarettes.   Past Medical History:  Diagnosis Date  . Abnormal Pap smear   . Anemia    HISTORY W/2006 PREGNANCY  . Anxiety   . Bipolar 1 disorder ([place])   . 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 Chlamydia 2004  . Depression    not currently on meds, being treated- trying to find a med  . Dyslexia   . Hypertelorism   . Migraines    otc med prn w/pregnancy  . 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 Ringworm   . Urinary tract infection     Patient Active Problem List   Diagnosis Date Noted  . Vertigo--positional 09/14/2012  . Unplanned wanted pregnancy 09/04/2011  . Chiari I malformation ([place]) 05/30/2011  . Preventative health care 10/23/2010  . Headache(784.0) 08/26/2010  . MAJOR DEPRESSIVE DISORDER RECURRENT EPISODE MILD 09/21/2007    Past Surgical History:  Procedure Laterality Date  . BILATERAL SALPINGECTOMY Bilateral 04/21/2012   Procedure: BILATERAL SALPINGECTOMY;  Surgeon: Cheri Fowler, MD;  Location: [facility];  Service: Obstetrics;  Laterality: Bilateral;  . CESAREAN SECTION    . CESAREAN SECTION N/A 04/21/2012   Procedure: CESAREAN SECTION;  Surgeon: Cheri Fowler, MD;  Location: [facility];  Service: Obstetrics;  Laterality: N/A;  Repeat  . CHOLECYSTECTOMY  2006  . EYE SURGERY     left eye surgery - lazy eye  . INDUCED ABORTION     vaccum on oct 2009  . MYRINGOTOMY    . TONSILLECTOMY      OB History    Gravida Para  Term Preterm AB Living   3 2 2  0 1 2   SAB TAB Ectopic Multiple Live Births     1     2       Home Medications    Prior to Admission medications   Medication Sig Start Date End Date Taking? Authorizing Provider  amoxicillin-clavulanate (AUGMENTIN) 875-125 MG tablet Take 1 tablet by mouth every 12 (twelve) hours. 07/19/16   Kinnie Feil, PA-C  benzonatate (TESSALON) 100 MG capsule Take 1 capsule (100 mg total) by mouth every 8 (eight) hours. 11/07/16   Varney Biles, MD  butalbital-acetaminophen-caffeine (FIORICET WITH CODEINE) 50-325-40-30 MG capsule TAKE ONE CAPSULE BY MOUTH AT ONSET OF HEADACHE 06/19/16   [provider]  clonazePAM (KLONOPIN) 0.5 MG tablet TAKE 1 TABLET AS DIRECTED 30 MIN BEFORE RIDING IN A CAR,MAY USE AS NEEDED 1 TIME PER DAY FOR PANIC 06/27/16   [provider]  clotrimazole-betamethasone (LOTRISONE) cream Apply 1 application topically 2 (two) times daily.    [provider]  cyclobenzaprine (FLEXERIL) 5 MG tablet Take 1 tablet (5 mg total) by mouth every 8 (eight) hours as needed for headache 05/26/16   Melvenia Beam, MD  fluconazole (DIFLUCAN) 100 MG tablet Take 100 mg by mouth daily.    [provider]  FLUoxetine (  PROZAC) 20 MG capsule TAKE 1 CAPSULE BY MOUTH ONCE A DAY FOR MOOD 06/26/16   [provider]  HYDROcodone-acetaminophen (NORCO/VICODIN) 5-325 MG tablet Take 2 tablets by mouth every 6 (six) hours as needed for severe pain. 07/20/16   Khatri, Hina, PA-C  ibuprofen (ADVIL,MOTRIN) 400 MG tablet Take 1 tablet (400 mg total) by mouth every 6 (six) hours as needed. 11/07/16   Varney Biles, MD  ondansetron (ZOFRAN) 8 MG tablet TAKE 1 TABLET BY MOUTH EVERY 4 TO 6 HOURS AS NEEDED FOR NAUSEA 06/19/16   [provider]  SAPHRIS 5 MG SUBL 24 hr tablet PLACE 1 TABLET UNDER TONGUE AT BEDTIME (MAY USE 1/2 TAB DURING DAY AS NEEDED FOR ANXIETY) 05/14/16   [provider]  SUMAtriptan (IMITREX) 100 MG tablet Take  1 tablet (100 mg total) by mouth once as needed. May repeat in 2 hours if headache persists or recurs. 05/26/16   Melvenia Beam, MD  topiramate (TOPAMAX) 100 MG tablet Take 2 tablets (200 mg total) by mouth at bedtime. 07/21/16   Ward Givens, NP    Family History Family History  Problem Relation Age of Onset  . Cancer Maternal Grandmother        Breast cancer  . Cancer Paternal Grandmother        Breast cancer  . Stroke Paternal Grandmother 31  . Diabetes Cousin   . Hypertension Cousin   . Hypertension Father   . Depression Father   . Other Father        radiation for cysts on brain  . Diabetes Mother   . Depression Mother     Social History Social History  Substance Use Topics  . Smoking status: Never Smoker  . Smokeless tobacco: Never Used  . Alcohol use No     Comment: socially but none with pregnancy     Allergies   Hydrocodone-acetaminophen and Adhesive [tape]   Review of Systems Review of Systems  Constitutional: Positive for activity change and fever.  HENT: Positive for congestion, postnasal drip, rhinorrhea, sinus pain and sore throat.   Respiratory: Positive for cough. Negative for wheezing.   Allergic/Immunologic: Negative for immunocompromised state.     Physical Exam Updated Vital Signs BP (!) 114/91 (BP Location: Left Arm)   Pulse 98   Temp 99.6 F (37.6 C) (Oral)   Ht 5\' 5"  (1.651 m)   Wt 128.9 kg (284 lb 4 oz)   LMP 10/19/2016   SpO2 98%   BMI 47.30 kg/m   Physical Exam  Constitutional: She is oriented to person, place, and time. She appears well-developed.  HENT:  Head: Normocephalic and atraumatic.  Mouth/Throat: Oropharynx is clear and moist. No oropharyngeal exudate.  Pt has no trismus, stridor or crepitus over the neck. Pharynx is erythematous.   Eyes: EOM are normal.  Neck: Normal range of motion. Neck supple.  Cardiovascular: Normal rate.   Pulmonary/Chest: Effort normal. No respiratory distress. She has no wheezes.    Abdominal: Bowel sounds are normal.  Lymphadenopathy:    She has cervical adenopathy.  Neurological: She is alert and oriented to person, place, and time.  Skin: Skin is warm and dry.  Nursing note and vitals reviewed.    ED Treatments / Results  Labs (all labs ordered are listed, but only abnormal results are displayed) Labs Reviewed - No data to display  EKG  EKG Interpretation None       Radiology Dg Chest 2 View  Result Date: 11/07/2016 CLINICAL DATA:  Congestion EXAM: CHEST  2 VIEW COMPARISON:  November 14, 2014 FINDINGS: Lungs are clear. Heart size and pulmonary vascularity are normal. No adenopathy. No evident bone lesions. IMPRESSION: No edema or consolidation. Electronically Signed   By: Lowella Grip III M.D.   On: 11/07/2016 15:25    Procedures Procedures (including critical care time)  Medications Ordered in ED Medications  guaiFENesin-codeine 100-10 MG/5ML solution 10 mL (not administered)     Initial Impression / Assessment and Plan / ED Course  I have reviewed the triage vital signs and the nursing notes.  Pertinent labs & imaging results that were available during my care of the patient were reviewed by me and considered in my medical decision making (see chart for details).      OBJECTIVE: She appears well, vital signs are as noted. Ears normal.  Throat and pharynx normal.  Neck supple. No adenopathy in the neck. Nose is congested. Sinuses non tender. The chest is clear, without wheezes or rales.  ASSESSMENT:  viral upper respiratory illness  PLAN: Symptomatic therapy suggested: push fluids, rest, gargle warm salt water, apply heat to sinuses prn and return office visit prn if symptoms persist or worsen. Lack of antibiotic effectiveness discussed with her. Call or return to clinic prn if these symptoms worsen or fail to improve as anticipated.   Final Clinical Impressions(s) / ED Diagnoses   Final diagnoses:  Viral URI with cough     New Prescriptions New Prescriptions   BENZONATATE (TESSALON) 100 MG CAPSULE    Take 1 capsule (100 mg total) by mouth every 8 (eight) hours.   IBUPROFEN (ADVIL,MOTRIN) 400 MG TABLET    Take 1 tablet (400 mg total) by mouth every 6 (six) hours as needed.     Varney Biles, MD 11/07/16 [phone]

## 2016-12-24 ENCOUNTER — Other Ambulatory Visit: Payer: Self-pay

## 2016-12-24 ENCOUNTER — Emergency Department (HOSPITAL_COMMUNITY): Payer: Self-pay

## 2016-12-24 ENCOUNTER — Emergency Department (HOSPITAL_COMMUNITY)
Admission: EM | Admit: 2016-12-24 | Discharge: 2016-12-24 | Disposition: A | Discharge status: Home / Self Care | Payer: Self-pay | Attending: Emergency Medicine | Admitting: Emergency Medicine

## 2016-12-24 ENCOUNTER — Encounter (HOSPITAL_COMMUNITY): Payer: Self-pay | Admitting: *Deleted

## 2016-12-24 DIAGNOSIS — R519 Headache, unspecified: Secondary | ICD-10-CM

## 2016-12-24 DIAGNOSIS — Z79899 Other long term (current) drug therapy: Secondary | ICD-10-CM | POA: Insufficient documentation

## 2016-12-24 DIAGNOSIS — R51 Headache: Secondary | ICD-10-CM | POA: Insufficient documentation

## 2016-12-24 DIAGNOSIS — M545 Low back pain: Secondary | ICD-10-CM | POA: Insufficient documentation

## 2016-12-24 DIAGNOSIS — W19XXXA Unspecified fall, initial encounter: Secondary | ICD-10-CM

## 2016-12-24 DIAGNOSIS — S3992XA Unspecified injury of lower back, initial encounter: Secondary | ICD-10-CM

## 2016-12-24 MED ORDER — METOCLOPRAMIDE HCL 10 MG PO TABS
10.0000 mg | ORAL_TABLET | Freq: Once | ORAL | Status: AC
  Administered 2016-12-24: 10 mg via ORAL
  Filled 2016-12-24: qty 1

## 2016-12-24 MED ORDER — IBUPROFEN 400 MG PO TABS
600.0000 mg | ORAL_TABLET | Freq: Once | ORAL | Status: AC
  Administered 2016-12-24: 21:00:00 600 mg via ORAL
  Filled 2016-12-24: qty 1

## 2016-12-24 NOTE — ED Triage Notes (Signed)
Pt reports falling down approx 4 metal steps today. Hit her tailbone and back of her head. No loc.
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
## 2016-12-24 NOTE — Discharge Instructions (Signed)
The x-ray of your tailbone does not show fracture.  Please take 800 mg ibuprofen every 6 hours for pain in your tailbone.  Please apply ice to the tailbone area tonight and tomorrow to assist with pain.  After 48 hours you may use heat instead of ice.  Return to the emergency department if you have a headache with vomiting that will not stop, headache with numbness in which you could not feel your arms or feet, headache with trouble seeing or have any new or worsening symptoms.

## 2017-03-13 ENCOUNTER — Encounter (HOSPITAL_COMMUNITY): Payer: Self-pay | Admitting: Emergency Medicine

## 2017-03-13 ENCOUNTER — Other Ambulatory Visit: Payer: Self-pay

## 2017-03-13 ENCOUNTER — Emergency Department (HOSPITAL_COMMUNITY)
Admission: EM | Admit: 2017-03-13 | Discharge: 2017-03-13 | Disposition: A | Discharge status: Home / Self Care | Payer: Medicaid Other | Attending: Emergency Medicine | Admitting: Emergency Medicine

## 2017-03-13 ENCOUNTER — Emergency Department (HOSPITAL_COMMUNITY): Payer: Medicaid Other

## 2017-03-13 DIAGNOSIS — Z79899 Other long term (current) drug therapy: Secondary | ICD-10-CM | POA: Insufficient documentation

## 2017-03-13 DIAGNOSIS — R21 Rash and other nonspecific skin eruption: Secondary | ICD-10-CM | POA: Insufficient documentation

## 2017-03-13 DIAGNOSIS — B9789 Other viral agents as the cause of diseases classified elsewhere: Secondary | ICD-10-CM | POA: Insufficient documentation

## 2017-03-13 DIAGNOSIS — J069 Acute upper respiratory infection, unspecified: Secondary | ICD-10-CM | POA: Insufficient documentation

## 2017-03-13 MED ORDER — BENZONATATE 100 MG PO CAPS: 100.0000 mg | ORAL_CAPSULE | Freq: Three times a day (TID) | ORAL | 0 refills | Status: DC

## 2017-03-13 MED ORDER — GUAIFENESIN 200 MG PO TABS: 200.0000 mg | ORAL_TABLET | ORAL | 0 refills | Status: DC | PRN

## 2017-03-13 MED ORDER — BENZONATATE 100 MG PO CAPS
100.0000 mg | ORAL_CAPSULE | Freq: Once | ORAL | Status: AC
  Administered 2017-03-13: 100 mg via ORAL
  Filled 2017-03-13: qty 1

## 2017-03-13 NOTE — ED Triage Notes (Signed)
Pt to ED c/o congestion, runny nose, post nasal drip x 4 days. Also endorses hives to abdomen and thighs - denies new body wash/meds/lotions. States she's tried OTC medications and Benadryl without relief. Denies fevers.
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
## 2017-04-22 ENCOUNTER — Other Ambulatory Visit: Payer: Self-pay

## 2017-04-22 ENCOUNTER — Emergency Department (HOSPITAL_COMMUNITY)
Admission: EM | Admit: 2017-04-22 | Discharge: 2017-04-22 | Disposition: A | Discharge status: Home / Self Care | Payer: Self-pay | Attending: Emergency Medicine | Admitting: Emergency Medicine

## 2017-04-22 ENCOUNTER — Encounter (HOSPITAL_COMMUNITY): Payer: Self-pay

## 2017-04-22 ENCOUNTER — Emergency Department (HOSPITAL_COMMUNITY): Payer: Self-pay

## 2017-04-22 DIAGNOSIS — Z79899 Other long term (current) drug therapy: Secondary | ICD-10-CM | POA: Insufficient documentation

## 2017-04-22 DIAGNOSIS — Z8249 Family history of ischemic heart disease and other diseases of the circulatory system: Secondary | ICD-10-CM | POA: Insufficient documentation

## 2017-04-22 DIAGNOSIS — R0789 Other chest pain: Secondary | ICD-10-CM | POA: Insufficient documentation

## 2017-04-22 LAB — BASIC METABOLIC PANEL
Anion gap: 6 (ref 5–15)
BUN: 10 mg/dL (ref 6–20)
CHLORIDE: 106 mmol/L (ref 101–111)
CO2: 27 mmol/L (ref 22–32)
CREATININE: 0.78 mg/dL (ref 0.44–1.00)
Calcium: 8.2 mg/dL — ABNORMAL LOW (ref 8.9–10.3)
GFR calc non Af Amer: >60 mL/min (ref >60)
Glucose, Bld: 113 mg/dL — ABNORMAL HIGH (ref 65–99)
POTASSIUM: 4 mmol/L (ref 3.5–5.1)
Sodium: 139 mmol/L (ref 135–145)

## 2017-04-22 LAB — I-STAT TROPONIN, ED: TROPONIN I, POC: 0 ng/mL (ref 0.00–0.08)

## 2017-04-22 MED ORDER — IBUPROFEN 400 MG PO TABS
600.0000 mg | ORAL_TABLET | Freq: Once | ORAL | Status: AC
  Administered 2017-04-22: 600 mg via ORAL
  Filled 2017-04-22: qty 1

## 2017-04-22 NOTE — Discharge Instructions (Signed)
Please read attached information. If you experience any new or worsening signs or symptoms please return to the emergency room for evaluation. Please follow-up with your primary care provider or specialist as discussed.  °

## 2017-04-22 NOTE — ED Provider Notes (Signed)
[facility]ERGENC[facility] DEPARTMENT Provider Note   CSN: [id number] Arrival date & time: 04/22/17  1035     History   Chief Complaint No chief complaint on file.   HPI Martha Mathews is a 32 y.o. female.  HPI   32 year old female presents today with complaints of chest pain.  Patient is last night she had a small amount of substernal chest pain.  She notes that this morning she woke up with worsening of symptoms.  She notes pain around the sternum, sharp in nature worse with inspiration worse with movement.  Patient did not take any medications prior to arrival.  She denies any fever cough, shortness of breath, abdominal pain, lower extremity swelling or edema.  She denies any history of DVT or PE, she denies any personal cardiac history.  She notes her father has histories of MI.  Patient is a non-smoker, not taking any estrogen.  She denies any significant risk factors for PE or DVT.   Past Medical History:  Diagnosis Date  . Abnormal Pap smear   . Anemia    HISTORY W/2006 PREGNANCY  . Anxiety   . Bipolar 1 disorder ([place])   . 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 Chlamydia 2004  . Depression    not currently on meds, being treated- trying to find a med  . Dyslexia   . Hypertelorism   . Migraines    otc med prn w/pregnancy  . 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 Ringworm   . Urinary tract infection     Patient Active Problem List   Diagnosis Date Noted  . Vertigo--positional 09/14/2012  . Unplanned wanted pregnancy 09/04/2011  . Chiari I malformation ([place]) 05/30/2011  . Preventative health care 10/23/2010  . Headache(784.0) 08/26/2010  . MAJOR DEPRESSIVE DISORDER RECURRENT EPISODE MILD 09/21/2007    Past Surgical History:  Procedure Laterality Date  . BILATERAL SALPINGECTOMY Bilateral 04/21/2012   Procedure: BILATERAL SALPINGECTOMY;  Surgeon: Cheri Fowler, MD;  Location: [facility];  Service: Obstetrics;  Laterality:  Bilateral;  . CESAREAN SECTION    . CESAREAN SECTION N/A 04/21/2012   Procedure: CESAREAN SECTION;  Surgeon: Cheri Fowler, MD;  Location: [facility];  Service: Obstetrics;  Laterality: N/A;  Repeat  . CHOLECYSTECTOMY  2006  . EYE SURGERY     left eye surgery - lazy eye  . INDUCED ABORTION     vaccum on oct 2009  . MYRINGOTOMY    . TONSILLECTOMY      OB History    Gravida Para Term Preterm AB Living   3 2 2  0 1 2   SAB TAB Ectopic Multiple Live Births     1     2       Home Medications    Prior to Admission medications   Medication Sig Start Date End Date Taking? Authorizing Provider  amoxicillin-clavulanate (AUGMENTIN) 875-125 MG tablet Take 1 tablet by mouth every 12 (twelve) hours. 07/19/16   Kinnie Feil, PA-C  benzonatate (TESSALON) 100 MG capsule Take 1 capsule (100 mg total) by mouth every 8 (eight) hours. 03/13/17   Domenic Moras, PA-C  butalbital-acetaminophen-caffeine (FIORICET WITH CODEINE) [phone] MG capsule TAKE ONE CAPSULE BY MOUTH AT ONSET OF HEADACHE 06/19/16   [provider]  clonazePAM (KLONOPIN) 0.5 MG tablet TAKE 1 TABLET AS DIRECTED 30 MIN BEFORE RIDING IN A CAR,MAY USE AS NEEDED 1 TIME PER DAY FOR PANIC 06/27/16  [provider]  clotrimazole-betamethasone (LOTRISONE) cream Apply 1 application topically 2 (two) times daily.    [provider]  cyclobenzaprine (FLEXERIL) 5 MG tablet Take 1 tablet (5 mg total) by mouth every 8 (eight) hours as needed for headache 05/26/16   Melvenia Beam, MD  fluconazole (DIFLUCAN) 100 MG tablet Take 100 mg by mouth daily.    [provider]  FLUoxetine (PROZAC) 20 MG capsule TAKE 1 CAPSULE BY MOUTH ONCE A DAY FOR MOOD 06/26/16   [provider]  guaiFENesin 200 MG tablet Take 1 tablet (200 mg total) by mouth every 4 (four) hours as needed for cough or to loosen phlegm. 03/13/17   Domenic Moras, PA-C  HYDROcodone-acetaminophen (NORCO/VICODIN) 5-325 MG tablet Take 2 tablets by mouth every  6 (six) hours as needed for severe pain. 07/20/16   Khatri, Hina, PA-C  ibuprofen (ADVIL,MOTRIN) 400 MG tablet Take 1 tablet (400 mg total) by mouth every 6 (six) hours as needed. 11/07/16   Varney Biles, MD  ondansetron (ZOFRAN) 8 MG tablet TAKE 1 TABLET BY MOUTH EVERY 4 TO 6 HOURS AS NEEDED FOR NAUSEA 06/19/16   [provider]  SAPHRIS 5 MG SUBL 24 hr tablet PLACE 1 TABLET UNDER TONGUE AT BEDTIME (MAY USE 1/2 TAB DURING DAY AS NEEDED FOR ANXIETY) 05/14/16   [provider]  SUMAtriptan (IMITREX) 100 MG tablet Take 1 tablet (100 mg total) by mouth once as needed. May repeat in 2 hours if headache persists or recurs. 05/26/16   Melvenia Beam, MD  topiramate (TOPAMAX) 100 MG tablet Take 2 tablets (200 mg total) by mouth at bedtime. 07/21/16   Ward Givens, NP    Family History Family History  Problem Relation Age of Onset  . Cancer Maternal Grandmother        Breast cancer  . Cancer Paternal Grandmother        Breast cancer  . Stroke Paternal Grandmother 49  . Diabetes Cousin   . Hypertension Cousin   . Hypertension Father   . Depression Father   . Other Father        radiation for cysts on brain  . Diabetes Mother   . Depression Mother     Social History Social History   Tobacco Use  . Smoking status: Never Smoker  . Smokeless tobacco: Never Used  Substance Use Topics  . Alcohol use: No    Comment: socially but none with pregnancy  . Drug use: No     Allergies   Hydrocodone-acetaminophen and Adhesive [tape]   Review of Systems Review of Systems  All other systems reviewed and are negative.    Physical Exam Updated Vital Signs BP 114/74   Pulse 66   Temp 98.3 F (36.8 C) (Oral)   Resp 18   LMP 04/15/2017   SpO2 97%   Physical Exam  Constitutional: She is oriented to person, place, and time. She appears well-developed and well-nourished.  HENT:  Head: Normocephalic and atraumatic.  Eyes: Conjunctivae are normal. Pupils are equal, round,  and reactive to light. Right eye exhibits no discharge. Left eye exhibits no discharge. No scleral icterus.  Neck: Normal range of motion. No JVD present. No tracheal deviation present.  Cardiovascular: Normal rate, regular rhythm, normal heart sounds and intact distal pulses. Exam reveals no gallop and no friction rub.  No murmur heard. Tenderness palpation over the sternum  Pulmonary/Chest: Effort normal and breath sounds normal. No stridor. No respiratory distress. She has no wheezes.  She has no rales. She exhibits no tenderness.  Musculoskeletal: She exhibits no edema.  Neurological: She is alert and oriented to person, place, and time. Coordination normal.  Psychiatric: She has a normal mood and affect. Her behavior is normal. Judgment and thought content normal.  Nursing note and vitals reviewed.    ED Treatments / Results  Labs (all labs ordered are listed, but only abnormal results are displayed) Labs Reviewed  BASIC METABOLIC PANEL - Abnormal; Notable for the following components:      Result Value   Glucose, Bld 113 (*)    Calcium 8.2 (*)    All other components within normal limits  I-STAT TROPONIN, ED    EKG  EKG Interpretation None       Radiology Dg Chest 2 View  Result Date: 04/22/2017 CLINICAL DATA:  32 year old female with right side chest pain, inspiratory pain for 1 day. EXAM: CHEST - 2 VIEW COMPARISON:  03/13/2017 and earlier. FINDINGS: Mediastinal contours remain normal. Normal lung volumes. Lung parenchyma is stable and clear. No pneumothorax or pleural effusion. Stable cholecystectomy clips. Negative visible bowel gas pattern. No acute osseous abnormality identified. Visualized tracheal air column is within normal limits. IMPRESSION: Stable and negative.  No acute cardiopulmonary abnormality. Electronically Signed   By: Genevie Ann M.D.   On: 04/22/2017 11:58    Procedures Procedures (including critical care time)  Medications Ordered in ED Medications    ibuprofen (ADVIL,MOTRIN) tablet 600 mg (600 mg Oral Given 04/22/17 1326)     Initial Impression / Assessment and Plan / ED Course  I have reviewed the triage vital signs and the nursing notes.  Pertinent labs & imaging results that were available during my care of the patient were reviewed by me and considered in my medical decision making (see chart for details).      Final Clinical Impressions(s) / ED Diagnoses   Final diagnoses:  Chest wall pain    Labs: I-STAT troponin, BMP  Imaging: DG chest 2 view with no acute abnormalities, ED EKG without acute abnormalities  Consults:  Therapeutics: Ibuprofen  Discharge Meds:   Assessment/Plan: 32 year old female presents today with complaints of chest pain.  This is most likely chest wall pain this is reproducible on my exam.  Patient has a reassuring workup here with negative troponin, reassuring EKG no significant findings on chest x-ray.  She has no infectious etiology.  I have very low suspicion for ACS in this otherwise with low heart score patient, very low suspicion for PE given PERC negative and low Wells score.  Patient will be encouraged use Tylenol or ibuprofen as needed for discomfort, return immediately with any new or worsening signs or symptoms.  She verbalized understanding and agreement to today's plan had no further questions or concerns.    ED Discharge Orders    None       Francee Gentile 04/22/17 1655    Dorie Rank, MD 04/23/17 [phone]

## 2017-04-22 NOTE — ED Triage Notes (Signed)
Patient complains of right sided CP and pain with inspiration x 1 day. Denies cold, denies cough. Guarding on arrival

## 2017-08-12 ENCOUNTER — Encounter (HOSPITAL_COMMUNITY): Payer: Self-pay | Admitting: *Deleted

## 2017-08-12 ENCOUNTER — Emergency Department (HOSPITAL_COMMUNITY)
Admission: EM | Admit: 2017-08-12 | Discharge: 2017-08-12 | Disposition: A | Discharge status: Home / Self Care | Payer: Self-pay | Attending: Emergency Medicine | Admitting: Emergency Medicine

## 2017-08-12 ENCOUNTER — Emergency Department (HOSPITAL_COMMUNITY): Payer: Self-pay

## 2017-08-12 ENCOUNTER — Other Ambulatory Visit: Payer: Self-pay

## 2017-08-12 DIAGNOSIS — M545 Low back pain, unspecified: Secondary | ICD-10-CM

## 2017-08-12 DIAGNOSIS — Z79899 Other long term (current) drug therapy: Secondary | ICD-10-CM | POA: Insufficient documentation

## 2017-08-12 DIAGNOSIS — W19XXXA Unspecified fall, initial encounter: Secondary | ICD-10-CM

## 2017-08-12 DIAGNOSIS — M25552 Pain in left hip: Secondary | ICD-10-CM | POA: Insufficient documentation

## 2017-08-12 LAB — POC URINE PREG, ED: PREG TEST UR: NEGATIVE

## 2017-08-12 MED ORDER — IBUPROFEN 600 MG PO TABS: 600.0000 mg | ORAL_TABLET | Freq: Four times a day (QID) | ORAL | 0 refills | Status: DC | PRN

## 2017-08-12 MED ORDER — METHOCARBAMOL 750 MG PO TABS: 750.0000 mg | ORAL_TABLET | Freq: Two times a day (BID) | ORAL | 0 refills | Status: DC

## 2017-08-12 NOTE — ED Triage Notes (Signed)
Pt in stating she fell on her stairs a few days ago and landed on her right buttocks/hip, since that time c/o increased back pain and pain down her right leg, ambulatory to room

## 2017-08-12 NOTE — ED Notes (Signed)
Pt verbalized understanding of d/c instructions and has no further questions, VSS, NAD.  

## 2017-08-12 NOTE — Discharge Instructions (Signed)
Medications: Robaxin, ibuprofen  Treatment: Take Robaxin twice daily as prescribed for muscle pain or spasms.  Do not drive or operate machinery while taking this medication.  Take ibuprofen as prescribed.  You can alternate with Tylenol, as prescribed counter.  Use ice and heat alternating 20 minutes on, 20 minutes off.  Attempt the exercises and stretches 1-2 times daily as tolerated.  Follow-up: Please follow-up with your doctor if your symptoms are not improving over the next 7 to 10 days.  Please return to the emergency department if you develop any new or worsening symptoms.

## 2017-08-12 NOTE — ED Provider Notes (Signed)
[facility] EMERGENCY DEPARTMENT Provider Note   CSN: [id number] Arrival date & time: 08/12/17  1826     History   Chief Complaint Chief Complaint  Patient presents with  . Fall    HPI Martha Mathews is a 32 y.o. female with history of bipolar 1 disorder, Chiari malformation, previous pelvic fracture and coccyx fracture who presents with a 5-day history of right low back pain after a fall.  Patient slipped and fell on a few steps.  She did not hit her head or lose consciousness.  She has had worsening pain with sitting and standing up.  She is had pain radiating around her right hip to her knee.  She denies any knee pain or distal leg pain.  She denies any numbness or tingling, saddle anesthesia, loss of bowel or bladder control, fevers, history of IVDU or recent procedure to back.  Patient reports she has had cortisone injections into her hip and back area for previous injury, but in 3 to 4 years.  She has been taking ibuprofen at home with some relief.  HPI  Past Medical History:  Diagnosis Date  . Abnormal Pap smear   . Anemia    HISTORY W/2006 PREGNANCY  . Anxiety   . Bipolar 1 disorder ([place])   . 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 Chlamydia 2004  . Depression    not currently on meds, being treated- trying to find a med  . Dyslexia   . Hypertelorism   . Migraines    otc med prn w/pregnancy  . 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 Ringworm   . Urinary tract infection     Patient Active Problem List   Diagnosis Date Noted  . Vertigo--positional 09/14/2012  . Unplanned wanted pregnancy 09/04/2011  . Chiari I malformation ([place]) 05/30/2011  . Preventative health care 10/23/2010  . Headache(784.0) 08/26/2010  . MAJOR DEPRESSIVE DISORDER RECURRENT EPISODE MILD 09/21/2007    Past Surgical History:  Procedure Laterality Date  . BILATERAL SALPINGECTOMY Bilateral 04/21/2012   Procedure: BILATERAL SALPINGECTOMY;  Surgeon:  Cheri Fowler, MD;  Location: [facility];  Service: Obstetrics;  Laterality: Bilateral;  . CESAREAN SECTION    . CESAREAN SECTION N/A 04/21/2012   Procedure: CESAREAN SECTION;  Surgeon: Cheri Fowler, MD;  Location: [facility];  Service: Obstetrics;  Laterality: N/A;  Repeat  . CHOLECYSTECTOMY  2006  . EYE SURGERY     left eye surgery - lazy eye  . INDUCED ABORTION     vaccum on oct 2009  . MYRINGOTOMY    . TONSILLECTOMY       OB History    Gravida  3   Para  2   Term  2   Preterm  0   AB  1   Living  2     SAB      TAB  1   Ectopic      Multiple      Live Births  2            Home Medications    Prior to Admission medications   Medication Sig Start Date End Date Taking? Authorizing Provider  amoxicillin-clavulanate (AUGMENTIN) 875-125 MG tablet Take 1 tablet by mouth every 12 (twelve) hours. 07/19/16   Kinnie Feil, PA-C  benzonatate (TESSALON) 100 MG capsule Take 1 capsule (100 mg total) by mouth every 8 (eight) hours. 03/13/17   Domenic Moras, PA-C  butalbital-acetaminophen-caffeine (FIORICET WITH CODEINE) 50-325-40-30 MG capsule TAKE ONE CAPSULE BY MOUTH AT ONSET OF HEADACHE 06/19/16   [provider]  clonazePAM (KLONOPIN) 0.5 MG tablet TAKE 1 TABLET AS DIRECTED 30 MIN BEFORE RIDING IN A CAR,MAY USE AS NEEDED 1 TIME PER DAY FOR PANIC 06/27/16   [provider]  clotrimazole-betamethasone (LOTRISONE) cream Apply 1 application topically 2 (two) times daily.    [provider]  cyclobenzaprine (FLEXERIL) 5 MG tablet Take 1 tablet (5 mg total) by mouth every 8 (eight) hours as needed for headache 05/26/16   Melvenia Beam, MD  fluconazole (DIFLUCAN) 100 MG tablet Take 100 mg by mouth daily.    [provider]  FLUoxetine (PROZAC) 20 MG capsule TAKE 1 CAPSULE BY MOUTH ONCE A DAY FOR MOOD 06/26/16   [provider]  guaiFENesin 200 MG tablet Take 1 tablet (200 mg total) by mouth every 4 (four) hours as needed for cough or to  loosen phlegm. 03/13/17   Domenic Moras, PA-C  HYDROcodone-acetaminophen (NORCO/VICODIN) 5-325 MG tablet Take 2 tablets by mouth every 6 (six) hours as needed for severe pain. 07/20/16   Khatri, Hina, PA-C  ibuprofen (ADVIL,MOTRIN) 600 MG tablet Take 1 tablet (600 mg total) by mouth every 6 (six) hours as needed. 08/12/17   Elya Tarquinio, Bea Graff, PA-C  methocarbamol (ROBAXIN) 750 MG tablet Take 1 tablet (750 mg total) by mouth 2 (two) times daily. 08/12/17   Lenus Trauger, Bea Graff, PA-C  ondansetron (ZOFRAN) 8 MG tablet TAKE 1 TABLET BY MOUTH EVERY 4 TO 6 HOURS AS NEEDED FOR NAUSEA 06/19/16   [provider]  SAPHRIS 5 MG SUBL 24 hr tablet PLACE 1 TABLET UNDER TONGUE AT BEDTIME (MAY USE 1/2 TAB DURING DAY AS NEEDED FOR ANXIETY) 05/14/16   [provider]  SUMAtriptan (IMITREX) 100 MG tablet Take 1 tablet (100 mg total) by mouth once as needed. May repeat in 2 hours if headache persists or recurs. 05/26/16   Melvenia Beam, MD  topiramate (TOPAMAX) 100 MG tablet Take 2 tablets (200 mg total) by mouth at bedtime. 07/21/16   Ward Givens, NP    Family History Family History  Problem Relation Age of Onset  . Cancer Maternal Grandmother        Breast cancer  . Cancer Paternal Grandmother        Breast cancer  . Stroke Paternal Grandmother 29  . Diabetes Cousin   . Hypertension Cousin   . Hypertension Father   . Depression Father   . Other Father        radiation for cysts on brain  . Diabetes Mother   . Depression Mother     Social History Social History   Tobacco Use  . Smoking status: Never Smoker  . Smokeless tobacco: Never Used  Substance Use Topics  . Alcohol use: No    Comment: socially but none with pregnancy  . Drug use: No     Allergies   Hydrocodone-acetaminophen and Adhesive [tape]   Review of Systems Review of Systems  Constitutional: Negative for fever.  Musculoskeletal: Positive for arthralgias and back pain. Negative for neck pain.  Neurological: Negative for  numbness.     Physical Exam Updated Vital Signs BP (!) 147/84 (BP Location: Right Arm)   Pulse 90   Temp 98.6 F (37 C) (Oral)   Resp 18   LMP 08/03/2017 Comment: Neg preg test on 08/12/2017  SpO2 100%   Physical Exam  Constitutional: She appears well-developed  and well-nourished. No distress.  HENT:  Head: Normocephalic and atraumatic.  Mouth/Throat: Oropharynx is clear and moist. No oropharyngeal exudate.  Eyes: Pupils are equal, round, and reactive to light. Conjunctivae are normal. Right eye exhibits no discharge. Left eye exhibits no discharge. No scleral icterus.  Neck: Normal range of motion. Neck supple. No thyromegaly present.  Cardiovascular: Normal rate, regular rhythm, normal heart sounds and intact distal pulses. Exam reveals no gallop and no friction rub.  No murmur heard. Pulmonary/Chest: Effort normal and breath sounds normal. No stridor. No respiratory distress. She has no wheezes. She has no rales.  Abdominal: Soft. Bowel sounds are normal. She exhibits no distension. There is no tenderness. There is no rebound and no guarding.  Musculoskeletal: She exhibits no edema.  Tenderness to the lower lumbar spine and coccyx as well as to the right hip posteriorly and laterally No midline cervical or thoracic tenderness  Lymphadenopathy:    She has no cervical adenopathy.  Neurological: She is alert. Coordination normal.  5/5 strength to bilateral lower extremities, sensation intact  Skin: Skin is warm and dry. No rash noted. She is not diaphoretic. No pallor.  Psychiatric: She has a normal mood and affect.  Nursing note and vitals reviewed.    ED Treatments / Results  Labs (all labs ordered are listed, but only abnormal results are displayed) Labs Reviewed  POC URINE PREG, ED    EKG None  Radiology Dg Lumbar Spine Complete  Result Date: 08/12/2017 CLINICAL DATA:  Recent fall, low back pain EXAM: LUMBAR SPINE - COMPLETE 4+ VIEW COMPARISON:  11/20/2015 lumbar  spine radiograph FINDINGS: This report assumes 5 non rib-bearing lumbar vertebrae. Lumbar vertebral body heights are preserved, with no fracture. No significant spondylosis. No spondylolisthesis. No appreciable facet arthropathy. No aggressive appearing focal osseous lesions. IMPRESSION: No lumbar spine fracture or spondylolisthesis. Electronically Signed   By: Ilona Sorrel M.D.   On: 08/12/2017 20:49   Dg Sacrum/coccyx  Result Date: 08/12/2017 CLINICAL DATA:  Patient fell 5 days ago onto right hip. Presents with sacral and coccygeal pain. EXAM: SACRUM AND COCCYX - 2+ VIEW COMPARISON:  12/24/2016 FINDINGS: There is no evidence of fracture or other focal bone lesions. IMPRESSION: No fracture of the sacrum or coccyx is identified. No presacral soft tissue swelling or displacement of overlying bowel loops to suggest presence of a hematoma. Electronically Signed   By: Ashley Royalty M.D.   On: 08/12/2017 20:52   Dg Hip Unilat W Or Wo Pelvis 2-3 Views Right  Result Date: 08/12/2017 CLINICAL DATA:  Recent fall with right hip pain EXAM: DG HIP (WITH OR WITHOUT PELVIS) 2-3V RIGHT COMPARISON:  06/06/2015 CT abdomen/pelvis FINDINGS: No pelvic fracture or diastasis. No right hip fracture or dislocation. No significant right hip arthropathy. No suspicious focal osseous lesion. No radiopaque foreign body. IMPRESSION: No fracture. Electronically Signed   By: Ilona Sorrel M.D.   On: 08/12/2017 20:51    Procedures Procedures (including critical care time)  Medications Ordered in ED Medications - No data to display   Initial Impression / Assessment and Plan / ED Course  I have reviewed the triage vital signs and the nursing notes.  Pertinent labs & imaging results that were available during my care of the patient were reviewed by me and considered in my medical decision making (see chart for details).     Patient presenting with low back pain with some associated radicular symptoms after fall 5 days ago.   X-rays are negative in  the ED.  Patient is neurovascularly intact.  No suspicion for cauda equina.  Patient is ambulatory in the ED.  Will treat supportively with ibuprofen, Tylenol, Robaxin, ice/heat, stretching.  Patient advised to follow-up with PCP if symptoms not improving.  Return precautions discussed.  Patient understands and agrees with plan.  Patient vitals stable throughout ED course and discharged in satisfactory condition.  Final Clinical Impressions(s) / ED Diagnoses   Final diagnoses:  Fall, initial encounter  Acute left-sided low back pain without sciatica  Left hip pain    ED Discharge Orders        Ordered    methocarbamol (ROBAXIN) 750 MG tablet  2 times daily     08/12/17 2107    ibuprofen (ADVIL,MOTRIN) 600 MG tablet  Every 6 hours PRN     08/12/17 2107       Frederica Kuster, PA-C 08/12/17 2118    Tanna Furry, MD 08/13/17 1444

## 2019-02-13 ENCOUNTER — Emergency Department (HOSPITAL_COMMUNITY): Payer: Medicaid Other

## 2019-02-13 ENCOUNTER — Emergency Department (HOSPITAL_COMMUNITY)
Admission: EM | Admit: 2019-02-13 | Discharge: 2019-02-13 | Disposition: A | Discharge status: Home / Self Care | Payer: Medicaid Other | Attending: Emergency Medicine | Admitting: Emergency Medicine

## 2019-02-13 ENCOUNTER — Other Ambulatory Visit: Payer: Self-pay

## 2019-02-13 DIAGNOSIS — R079 Chest pain, unspecified: Secondary | ICD-10-CM | POA: Insufficient documentation

## 2019-02-13 DIAGNOSIS — R05 Cough: Secondary | ICD-10-CM | POA: Diagnosis present

## 2019-02-13 DIAGNOSIS — U071 COVID-19: Secondary | ICD-10-CM | POA: Diagnosis not present

## 2019-02-13 DIAGNOSIS — R509 Fever, unspecified: Secondary | ICD-10-CM | POA: Insufficient documentation

## 2019-02-13 DIAGNOSIS — Z79899 Other long term (current) drug therapy: Secondary | ICD-10-CM | POA: Insufficient documentation

## 2019-02-13 LAB — BASIC METABOLIC PANEL
Anion gap: 8 (ref 5–15)
BUN: 15 mg/dL (ref 6–20)
CO2: 28 mmol/L (ref 22–32)
Calcium: 8.4 mg/dL — ABNORMAL LOW (ref 8.9–10.3)
Chloride: 101 mmol/L (ref 98–111)
Creatinine, Ser: 0.81 mg/dL (ref 0.44–1.00)
GFR calc Af Amer: >60 mL/min (ref >60)
GFR calc non Af Amer: >60 mL/min (ref >60)
Glucose, Bld: 153 mg/dL — ABNORMAL HIGH (ref 70–99)
Potassium: 3.9 mmol/L (ref 3.5–5.1)
Sodium: 137 mmol/L (ref 135–145)

## 2019-02-13 LAB — I-STAT BETA HCG BLOOD, ED (MC, WL, AP ONLY): I-stat hCG, quantitative: <5 m[IU]/mL (ref <5)

## 2019-02-13 LAB — CBC
HCT: 40.2 % (ref 36.0–46.0)
Hemoglobin: 12.7 g/dL (ref 12.0–15.0)
MCH: 25.8 pg — ABNORMAL LOW (ref 26.0–34.0)
MCHC: 31.6 g/dL (ref 30.0–36.0)
MCV: 81.5 fL (ref 80.0–100.0)
Platelets: 522 10*3/uL — ABNORMAL HIGH (ref 150–400)
RBC: 4.93 MIL/uL (ref 3.87–5.11)
RDW: 13.2 % (ref 11.5–15.5)
WBC: 15.4 10*3/uL — ABNORMAL HIGH (ref 4.0–10.5)
nRBC: 0 % (ref 0.0–0.2)

## 2019-02-13 LAB — TROPONIN I (HIGH SENSITIVITY): Troponin I (High Sensitivity): <2 ng/L (ref <18)

## 2019-02-13 MED ORDER — SODIUM CHLORIDE 0.9% FLUSH: 3.0000 mL | Freq: Once | INTRAVENOUS | Status: DC

## 2019-02-13 NOTE — ED Notes (Signed)
Portable Xray at bedside.

## 2019-02-13 NOTE — Discharge Instructions (Addendum)
Continue to wean yourself off oxygen.  Today your chest x-ray looks improved compared to old chest x-ray.  Believe clinically you are improving.  Continue your home medications.  Follow-up with your primary care doctor.

## 2019-02-13 NOTE — ED Provider Notes (Signed)
[facility] EMERGENCY DEPARTMENT Provider Note   CSN: [id number] Arrival date & time: 02/13/19  1432     History Chief Complaint  Patient presents with  . Covid+/CP    Martha Mathews is a 33 y.o. female.  Patient positive for coronavirus this past week.  Has been on steroids and oxygen.  Continues to have bad cough and chest pain.  Denies any active shortness of breath.  No nausea, no vomiting, no abdominal pain.  The history is provided by the patient.  URI Presenting symptoms: cough and fever   Presenting symptoms: no ear pain and no sore throat   Severity:  Mild Onset quality:  Gradual Timing:  Constant Progression:  Unchanged Chronicity:  New Relieved by: steroids, OTC meds for cough, oxygen. Worsened by:  Nothing Associated symptoms: arthralgias   Risk factors: no sick contacts        Past Medical History:  Diagnosis Date  . Abnormal Pap smear   . Anemia    HISTORY W/2006 PREGNANCY  . Anxiety   . Bipolar 1 disorder ([place])   . 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 Chlamydia 2004  . Depression    not currently on meds, being treated- trying to find a med  . Dyslexia   . Hypertelorism   . Migraines    otc med prn w/pregnancy  . 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 Ringworm   . Urinary tract infection     Patient Active Problem List   Diagnosis Date Noted  . Vertigo--positional 09/14/2012  . Unplanned wanted pregnancy 09/04/2011  . Chiari I malformation ([place]) 05/30/2011  . Preventative health care 10/23/2010  . Headache(784.0) 08/26/2010  . MAJOR DEPRESSIVE DISORDER RECURRENT EPISODE MILD 09/21/2007    Past Surgical History:  Procedure Laterality Date  . BILATERAL SALPINGECTOMY Bilateral 04/21/2012   Procedure: BILATERAL SALPINGECTOMY;  Surgeon: Cheri Fowler, MD;  Location: [facility];  Service: Obstetrics;  Laterality: Bilateral;  . CESAREAN SECTION    . CESAREAN SECTION N/A 04/21/2012   Procedure:  CESAREAN SECTION;  Surgeon: Cheri Fowler, MD;  Location: [facility];  Service: Obstetrics;  Laterality: N/A;  Repeat  . CHOLECYSTECTOMY  2006  . EYE SURGERY     left eye surgery - lazy eye  . INDUCED ABORTION     vaccum on oct 2009  . MYRINGOTOMY    . TONSILLECTOMY       OB History    Gravida  3   Para  2   Term  2   Preterm  0   AB  1   Living  2     SAB      TAB  1   Ectopic      Multiple      Live Births  2           Family History  Problem Relation Age of Onset  . Cancer Maternal Grandmother        Breast cancer  . Cancer Paternal Grandmother        Breast cancer  . Stroke Paternal Grandmother 28  . Diabetes Cousin   . Hypertension Cousin   . Hypertension Father   . Depression Father   . Other Father        radiation for cysts on brain  . Diabetes Mother   . Depression Mother     Social History   Tobacco Use  . Smoking status: Never  Smoker  . Smokeless tobacco: Never Used  Substance Use Topics  . Alcohol use: No    Comment: socially but none with pregnancy  . Drug use: No    Home Medications Prior to Admission medications   Medication Sig Start Date End Date Taking? Authorizing Provider  amoxicillin-clavulanate (AUGMENTIN) 875-125 MG tablet Take 1 tablet by mouth every 12 (twelve) hours. 07/19/16   Kinnie Feil, PA-C  benzonatate (TESSALON) 100 MG capsule Take 1 capsule (100 mg total) by mouth every 8 (eight) hours. 03/13/17   Domenic Moras, PA-C  butalbital-acetaminophen-caffeine (FIORICET WITH CODEINE) [phone] MG capsule TAKE ONE CAPSULE BY MOUTH AT ONSET OF HEADACHE 06/19/16   [provider]  clonazePAM (KLONOPIN) 0.5 MG tablet TAKE 1 TABLET AS DIRECTED 30 MIN BEFORE RIDING IN A CAR,MAY USE AS NEEDED 1 TIME PER DAY FOR PANIC 06/27/16   [provider]  clotrimazole-betamethasone (LOTRISONE) cream Apply 1 application topically 2 (two) times daily.    [provider]  cyclobenzaprine (FLEXERIL) 5 MG tablet  Take 1 tablet (5 mg total) by mouth every 8 (eight) hours as needed for headache 05/26/16   Melvenia Beam, MD  fluconazole (DIFLUCAN) 100 MG tablet Take 100 mg by mouth daily.    [provider]  FLUoxetine (PROZAC) 20 MG capsule TAKE 1 CAPSULE BY MOUTH ONCE A DAY FOR MOOD 06/26/16   [provider]  guaiFENesin 200 MG tablet Take 1 tablet (200 mg total) by mouth every 4 (four) hours as needed for cough or to loosen phlegm. 03/13/17   Domenic Moras, PA-C  HYDROcodone-acetaminophen (NORCO/VICODIN) 5-325 MG tablet Take 2 tablets by mouth every 6 (six) hours as needed for severe pain. 07/20/16   Khatri, Hina, PA-C  ibuprofen (ADVIL,MOTRIN) 600 MG tablet Take 1 tablet (600 mg total) by mouth every 6 (six) hours as needed. 08/12/17   Law, Bea Graff, PA-C  methocarbamol (ROBAXIN) 750 MG tablet Take 1 tablet (750 mg total) by mouth 2 (two) times daily. 08/12/17   Law, Bea Graff, PA-C  ondansetron (ZOFRAN) 8 MG tablet TAKE 1 TABLET BY MOUTH EVERY 4 TO 6 HOURS AS NEEDED FOR NAUSEA 06/19/16   [provider]  SAPHRIS 5 MG SUBL 24 hr tablet PLACE 1 TABLET UNDER TONGUE AT BEDTIME (MAY USE 1/2 TAB DURING DAY AS NEEDED FOR ANXIETY) 05/14/16   [provider]  SUMAtriptan (IMITREX) 100 MG tablet Take 1 tablet (100 mg total) by mouth once as needed. May repeat in 2 hours if headache persists or recurs. 05/26/16   Melvenia Beam, MD  topiramate (TOPAMAX) 100 MG tablet Take 2 tablets (200 mg total) by mouth at bedtime. 07/21/16   Ward Givens, NP    Allergies    Hydrocodone-acetaminophen and Adhesive [tape]  Review of Systems   Review of Systems  Constitutional: Positive for fever. Negative for chills.  HENT: Negative for ear pain and sore throat.   Eyes: Negative for pain and visual disturbance.  Respiratory: Positive for cough. Negative for shortness of breath.   Cardiovascular: Negative for chest pain and palpitations.  Gastrointestinal: Negative for abdominal pain and  vomiting.  Genitourinary: Negative for dysuria and hematuria.  Musculoskeletal: Positive for arthralgias. Negative for back pain.  Skin: Negative for color change and rash.  Neurological: Negative for seizures and syncope.  All other systems reviewed and are negative.   Physical Exam Updated Vital Signs BP 123/80 (BP Location: Right Arm)   Pulse 77   Temp 98.7 F (37.1 C) (Oral)  Resp (!) 21   SpO2 100%   Physical Exam Vitals and nursing note reviewed.  Constitutional:      General: She is not in acute distress.    Appearance: She is well-developed.  HENT:     Head: Normocephalic and atraumatic.     Nose: Nose normal.     Mouth/Throat:     Mouth: Mucous membranes are moist.  Eyes:     Extraocular Movements: Extraocular movements intact.     Conjunctiva/sclera: Conjunctivae normal.     Pupils: Pupils are equal, round, and reactive to light.  Cardiovascular:     Rate and Rhythm: Normal rate and regular rhythm.     Pulses: Normal pulses.     Heart sounds: Normal heart sounds. No murmur.  Pulmonary:     Effort: Pulmonary effort is normal. No respiratory distress.     Breath sounds: Normal breath sounds.  Abdominal:     Palpations: Abdomen is soft.     Tenderness: There is no abdominal tenderness.  Musculoskeletal:     Cervical back: Normal range of motion and neck supple.  Skin:    General: Skin is warm and dry.     Capillary Refill: Capillary refill takes less than 2 seconds.  Neurological:     General: No focal deficit present.     Mental Status: She is alert.  Psychiatric:        Mood and Affect: Mood normal.     ED Results / Procedures / Treatments   Labs (all labs ordered are listed, but only abnormal results are displayed) Labs Reviewed  BASIC METABOLIC PANEL - Abnormal; Notable for the following components:      Result Value   Glucose, Bld 153 (*)    Calcium 8.4 (*)    All other components within normal limits  CBC - Abnormal; Notable for the  following components:   WBC 15.4 (*)    MCH 25.8 (*)    Platelets 522 (*)    All other components within normal limits  I-STAT BETA HCG BLOOD, ED ([facility], [facility], [facility] ONLY)  TROPONIN I (HIGH SENSITIVITY)    EKG EKG Interpretation  Date/Time:  Sunday February 13 2019 15:11:09 EST Ventricular Rate:  80 PR Interval:  122 QRS Duration: 94 QT Interval:  400 QTC Calculation: 461 R Axis:   40 Text Interpretation: Normal sinus rhythm Normal ECG Confirmed by Lennice Sites [phone]) on 02/13/2019 3:57:52 PM   Radiology DG Chest Portable 1 View  Result Date: 02/13/2019 CLINICAL DATA:  COVID-19 EXAM: PORTABLE CHEST 1 VIEW COMPARISON:  Portable exam 1617 hours compared to 02/07/2019 FINDINGS: Normal heart size, mediastinal contours, and pulmonary vascularity. Hazy infiltrate at the peripheral RIGHT lung base, minimally LEFT base, consistent with multifocal pneumonia. RIGHT basilar infiltrate appears improved. No pleural effusion or pneumothorax. Bones unremarkable. IMPRESSION: Bibasilar infiltrates consistent with multifocal pneumonia, improved. Electronically Signed   By: Lavonia Dana M.D.   On: 02/13/2019 16:32    Procedures Procedures (including critical care time)  Medications Ordered in ED Medications  sodium chloride flush (NS) 0.9 % injection 3 mL (has no administration in time range)    ED Course  I have reviewed the triage vital signs and the nursing notes.  Pertinent labs & imaging results that were available during my care of the patient were reviewed by me and considered in my medical decision making (see chart for details).    MDM Rules/Calculators/A&P  Martha Mathews is a 33 year old female with no significant medical history  presents the ED with cough, Covid symptoms.  Patient with normal vitals.  No fever.  On room air she has normal oxygenation.  She has been on steroids, OTC cough medications, vitamins and has oxygen at home as well.  Continues to have cough and chest  congestion.  EKG is sinus rhythm.  No ischemic changes.  Troponin normal.  Chest x-ray overall shows improving multifocal pneumonia.  Has mild leukocytosis which is likely from steroids.  Otherwise no significant electrolyte abnormalities.  Overall patient appears well and believe clinically she is likely improving as she no longer needs oxygen.  Given reassurance and discharged in ED in good condition.  Given return precautions.  This chart was dictated using voice recognition software.  Despite best efforts to proofread,  errors can occur which can change the documentation meaning.  Martha Mathews was evaluated in Emergency Department on 02/13/2019 for the symptoms described in the history of present illness. She was evaluated in the context of the global COVID-19 pandemic, which necessitated consideration that the patient might be at risk for infection with the SARS-CoV-2 virus that causes COVID-19. Institutional protocols and algorithms that pertain to the evaluation of patients at risk for COVID-19 are in a state of rapid change based on information released by regulatory bodies including the CDC and federal and state organizations. These policies and algorithms were followed during the patient's care in the ED.    Final Clinical Impression(s) / ED Diagnoses Final diagnoses:  COVID-19 virus infection    Rx / DC Orders ED Discharge Orders    None       Lennice Sites, DO 02/13/19 1716

## 2019-02-13 NOTE — ED Notes (Signed)
Patient Alert and oriented to baseline. Stable and ambulatory to baseline. Patient verbalized understanding of the discharge instructions.  Patient belongings were taken by the patient.   

## 2019-02-13 NOTE — ED Triage Notes (Signed)
PT is covid positive since 12/20. She has been O2 dependent since diagnosis. She began having chest pain last night to mid chest. She got 324 Asprin and 2 Nitro. 20G to LAC placed by EMS. 95% on 2L.

## 2019-02-13 NOTE — ED Notes (Signed)
ED Provider at bedside. 

## 2019-05-27 ENCOUNTER — Emergency Department (HOSPITAL_COMMUNITY)
Admission: EM | Admit: 2019-05-27 | Discharge: 2019-05-27 | Disposition: A | Discharge status: Home / Self Care | Payer: Medicaid Other | Attending: Emergency Medicine | Admitting: Emergency Medicine

## 2019-05-27 ENCOUNTER — Other Ambulatory Visit: Payer: Self-pay

## 2019-05-27 ENCOUNTER — Encounter (HOSPITAL_COMMUNITY): Payer: Self-pay | Admitting: Emergency Medicine

## 2019-05-27 DIAGNOSIS — B001 Herpesviral vesicular dermatitis: Secondary | ICD-10-CM | POA: Diagnosis not present

## 2019-05-27 DIAGNOSIS — R0981 Nasal congestion: Secondary | ICD-10-CM | POA: Diagnosis not present

## 2019-05-27 DIAGNOSIS — Q0700 Arnold-Chiari syndrome without spina bifida or hydrocephalus: Secondary | ICD-10-CM | POA: Insufficient documentation

## 2019-05-27 DIAGNOSIS — R21 Rash and other nonspecific skin eruption: Secondary | ICD-10-CM | POA: Diagnosis present

## 2019-05-27 MED ORDER — FLUTICASONE PROPIONATE 50 MCG/ACT NA SUSP: 2.0000 | Freq: Every day | NASAL | 0 refills | Status: DC

## 2019-05-27 MED ORDER — VALACYCLOVIR HCL 1 G PO TABS: 2000.0000 mg | ORAL_TABLET | Freq: Two times a day (BID) | ORAL | 0 refills | Status: AC

## 2019-05-27 MED ORDER — DOCOSANOL 10 % EX CREA: TOPICAL_CREAM | CUTANEOUS | 0 refills | Status: DC

## 2019-05-27 NOTE — ED Triage Notes (Signed)
Per pt, states she started getting blisters on mouth and lips a few days ago-states noticed her left neck/gland swollen yesterday-doesn't know if she has an infection or not

## 2019-05-27 NOTE — ED Provider Notes (Signed)
[facility] DEPT Provider Note   CSN: [id number] Arrival date & time: 05/27/19  1104     History Chief Complaint  Patient presents with  . blisters    Martha Mathews is a 34 y.o. female.  HPI   Patient is a 34 year old female with a history of anemia, anxiety, bipolar 1 disorder, Chiari malformation, chlamydia, depression, dyslexia, hypertelorism, migraines, diarrhea, ringworm, who presents the emergency department today for evaluation of blisters to her upper lip.  States this appeared a few days ago.  They are painful to touch.  She also noted associated lymphadenopathy that started yesterday on the left side of her neck.  She also complains of a headache and some body aches.  She has had some nasal congestion at night and feels some pressure in the left ear.  She is never had similar symptoms in the past.  She denies any other infectious symptoms at this time.  Past Medical History:  Diagnosis Date  . Abnormal Pap smear   . Anemia    HISTORY W/2006 PREGNANCY  . Anxiety   . Bipolar 1 disorder ([place])   . 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 Chlamydia 2004  . Depression    not currently on meds, being treated- trying to find a med  . Dyslexia   . Hypertelorism   . Migraines    otc med prn w/pregnancy  . 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 Ringworm   . Urinary tract infection     Patient Active Problem List   Diagnosis Date Noted  . Vertigo--positional 09/14/2012  . Unplanned wanted pregnancy 09/04/2011  . Chiari I malformation ([place]) 05/30/2011  . Preventative health care 10/23/2010  . Headache(784.0) 08/26/2010  . MAJOR DEPRESSIVE DISORDER RECURRENT EPISODE MILD 09/21/2007    Past Surgical History:  Procedure Laterality Date  . BILATERAL SALPINGECTOMY Bilateral 04/21/2012   Procedure: BILATERAL SALPINGECTOMY;  Surgeon: Cheri Fowler, MD;  Location: [facility];  Service: Obstetrics;  Laterality: Bilateral;    . CESAREAN SECTION    . CESAREAN SECTION N/A 04/21/2012   Procedure: CESAREAN SECTION;  Surgeon: Cheri Fowler, MD;  Location: [facility];  Service: Obstetrics;  Laterality: N/A;  Repeat  . CHOLECYSTECTOMY  2006  . EYE SURGERY     left eye surgery - lazy eye  . INDUCED ABORTION     vaccum on oct 2009  . MYRINGOTOMY    . TONSILLECTOMY       OB History    Gravida  3   Para  2   Term  2   Preterm  0   AB  1   Living  2     SAB      TAB  1   Ectopic      Multiple      Live Births  2           Family History  Problem Relation Age of Onset  . Cancer Maternal Grandmother        Breast cancer  . Cancer Paternal Grandmother        Breast cancer  . Stroke Paternal Grandmother 101  . Diabetes Cousin   . Hypertension Cousin   . Hypertension Father   . Depression Father   . Other Father        radiation for cysts on brain  . Diabetes Mother   . Depression Mother     Social History  Tobacco Use  . Smoking status: Never Smoker  . Smokeless tobacco: Never Used  Substance Use Topics  . Alcohol use: No    Comment: socially but none with pregnancy  . Drug use: No    Home Medications Prior to Admission medications   Medication Sig Start Date End Date Taking? Authorizing Provider  amoxicillin-clavulanate (AUGMENTIN) 875-125 MG tablet Take 1 tablet by mouth every 12 (twelve) hours. 07/19/16   Kinnie Feil, PA-C  benzonatate (TESSALON) 100 MG capsule Take 1 capsule (100 mg total) by mouth every 8 (eight) hours. 03/13/17   Domenic Moras, PA-C  butalbital-acetaminophen-caffeine (FIORICET WITH CODEINE) [phone] MG capsule TAKE ONE CAPSULE BY MOUTH AT ONSET OF HEADACHE 06/19/16   [provider]  clonazePAM (KLONOPIN) 0.5 MG tablet TAKE 1 TABLET AS DIRECTED 30 MIN BEFORE RIDING IN A CAR,MAY USE AS NEEDED 1 TIME PER DAY FOR PANIC 06/27/16   [provider]  clotrimazole-betamethasone (LOTRISONE) cream Apply 1 application topically 2 (two) times daily.     [provider]  cyclobenzaprine (FLEXERIL) 5 MG tablet Take 1 tablet (5 mg total) by mouth every 8 (eight) hours as needed for headache 05/26/16   Melvenia Beam, MD  Docosanol (ABREVA) 10 % CREA Apply a small amount to the lesions up to 5 times per day until the lesions have healed. 05/27/19   Karron Alvizo S, PA-C  fluconazole (DIFLUCAN) 100 MG tablet Take 100 mg by mouth daily.    [provider]  FLUoxetine (PROZAC) 20 MG capsule TAKE 1 CAPSULE BY MOUTH ONCE A DAY FOR MOOD 06/26/16   [provider]  fluticasone (FLONASE) 50 MCG/ACT nasal spray Place 2 sprays into both nostrils daily. 05/27/19   Jaelynn Pozo S, PA-C  guaiFENesin 200 MG tablet Take 1 tablet (200 mg total) by mouth every 4 (four) hours as needed for cough or to loosen phlegm. 03/13/17   Domenic Moras, PA-C  HYDROcodone-acetaminophen (NORCO/VICODIN) 5-325 MG tablet Take 2 tablets by mouth every 6 (six) hours as needed for severe pain. 07/20/16   Khatri, Hina, PA-C  ibuprofen (ADVIL,MOTRIN) 600 MG tablet Take 1 tablet (600 mg total) by mouth every 6 (six) hours as needed. 08/12/17   Law, Bea Graff, PA-C  methocarbamol (ROBAXIN) 750 MG tablet Take 1 tablet (750 mg total) by mouth 2 (two) times daily. 08/12/17   Law, Bea Graff, PA-C  ondansetron (ZOFRAN) 8 MG tablet TAKE 1 TABLET BY MOUTH EVERY 4 TO 6 HOURS AS NEEDED FOR NAUSEA 06/19/16   [provider]  SAPHRIS 5 MG SUBL 24 hr tablet PLACE 1 TABLET UNDER TONGUE AT BEDTIME (MAY USE 1/2 TAB DURING DAY AS NEEDED FOR ANXIETY) 05/14/16   [provider]  SUMAtriptan (IMITREX) 100 MG tablet Take 1 tablet (100 mg total) by mouth once as needed. May repeat in 2 hours if headache persists or recurs. 05/26/16   Melvenia Beam, MD  topiramate (TOPAMAX) 100 MG tablet Take 2 tablets (200 mg total) by mouth at bedtime. 07/21/16   Ward Givens, NP  valACYclovir (VALTREX) 1000 MG tablet Take 2 tablets (2,000 mg total) by mouth 2 (two) times daily for 1 day.  05/27/19 05/28/19  Nayleah Gamel S, PA-C    Allergies    Hydrocodone-acetaminophen and Adhesive [tape]  Review of Systems   Review of Systems  Constitutional: Negative for fever.  HENT:       Lip pain  Neurological: Positive for headaches.  Hematological: Positive for adenopathy.    Physical Exam  Updated Vital Signs BP (!) 165/123 (BP Location: Right Arm)   Pulse 86   Temp 99.2 F (37.3 C) (Oral)   Resp 16   SpO2 97%   Physical Exam Vitals and nursing note reviewed.  Constitutional:      General: She is not in acute distress.    Appearance: She is well-developed.  HENT:     Head: Normocephalic and atraumatic.     Ears:     Comments: Left TM is nonerythematous and is not bulging however does have some serous fluid present behind the TM.    Mouth/Throat:     Comments: Vesicular lesions noted to the vermilion border of the upper lip noted medially and to the right side of the upper lip.  These are tender to touch. Eyes:     Conjunctiva/sclera: Conjunctivae normal.  Cardiovascular:     Rate and Rhythm: Normal rate.  Pulmonary:     Effort: Pulmonary effort is normal.  Musculoskeletal:        General: Normal range of motion.     Cervical back: Neck supple.  Lymphadenopathy:     Cervical: Cervical adenopathy present.  Skin:    General: Skin is warm and dry.  Neurological:     Mental Status: She is alert.     ED Results / Procedures / Treatments   Labs (all labs ordered are listed, but only abnormal results are displayed) Labs Reviewed - No data to display  EKG None  Radiology No results found.  Procedures Procedures (including critical care time)  Medications Ordered in ED Medications - No data to display  ED Course  I have reviewed the triage vital signs and the nursing notes.  Pertinent labs & imaging results that were available during my care of the patient were reviewed by me and considered in my medical decision making (see chart for details).     MDM Rules/Calculators/A&P                      Patient presenting with blisters to the upper lip for the last few days.  Associated with lymphadenopathy.  Also with some nasal congestion and left ear pressure.  Exam consistent with herpes labialis.  Will treat with antiviral.  We will also give Flonase for her nasal congestion.  Have advised her to follow-up with PCP and return to the ED for new or worsening symptoms.  She voices understanding of plan and reasons to return.  Questions answered.  Patient stable for discharge.  Final Clinical Impression(s) / ED Diagnoses Final diagnoses:  Herpes labialis  Nasal congestion    Rx / DC Orders ED Discharge Orders         Ordered    valACYclovir (VALTREX) 1000 MG tablet  2 times daily     05/27/19 1139    Docosanol (ABREVA) 10 % CREA     05/27/19 1139    fluticasone (FLONASE) 50 MCG/ACT nasal spray  Daily     05/27/19 1140           Aeon Kessner S, PA-C 05/27/19 1140    Malvin Johns, MD 05/27/19 1201

## 2019-05-27 NOTE — Discharge Instructions (Signed)
Please take the valacyclovir as directed for the lesions on your lips.  You may also use the cream to help with the sores as well.  You were given fluticasone to help with your nasal congestion and ear pressure.  Use as directed.  Please follow up with your primary care provider within 5-7 days for re-evaluation of your symptoms. If you do not have a primary care provider, information for a healthcare clinic has been provided for you to make arrangements for follow up care. Please return to the emergency department for any new or worsening symptoms.

## 2019-07-07 ENCOUNTER — Other Ambulatory Visit: Payer: Self-pay

## 2019-07-07 ENCOUNTER — Encounter (HOSPITAL_COMMUNITY): Payer: Self-pay

## 2019-07-07 ENCOUNTER — Emergency Department (HOSPITAL_COMMUNITY): Payer: No Typology Code available for payment source

## 2019-07-07 ENCOUNTER — Emergency Department (HOSPITAL_COMMUNITY)
Admission: EM | Admit: 2019-07-07 | Discharge: 2019-07-07 | Disposition: A | Discharge status: Home / Self Care | Payer: No Typology Code available for payment source | Attending: Emergency Medicine | Admitting: Emergency Medicine

## 2019-07-07 DIAGNOSIS — S79911A Unspecified injury of right hip, initial encounter: Secondary | ICD-10-CM | POA: Insufficient documentation

## 2019-07-07 DIAGNOSIS — Y929 Unspecified place or not applicable: Secondary | ICD-10-CM | POA: Diagnosis not present

## 2019-07-07 DIAGNOSIS — Y999 Unspecified external cause status: Secondary | ICD-10-CM | POA: Diagnosis not present

## 2019-07-07 DIAGNOSIS — R109 Unspecified abdominal pain: Secondary | ICD-10-CM | POA: Insufficient documentation

## 2019-07-07 DIAGNOSIS — Y939 Activity, unspecified: Secondary | ICD-10-CM | POA: Diagnosis not present

## 2019-07-07 DIAGNOSIS — S49191A Other physeal fracture of lower end of humerus, right arm, initial encounter for closed fracture: Secondary | ICD-10-CM | POA: Insufficient documentation

## 2019-07-07 DIAGNOSIS — M542 Cervicalgia: Secondary | ICD-10-CM | POA: Diagnosis not present

## 2019-07-07 DIAGNOSIS — R519 Headache, unspecified: Secondary | ICD-10-CM | POA: Diagnosis not present

## 2019-07-07 DIAGNOSIS — S4991XA Unspecified injury of right shoulder and upper arm, initial encounter: Secondary | ICD-10-CM

## 2019-07-07 DIAGNOSIS — T07XXXA Unspecified multiple injuries, initial encounter: Secondary | ICD-10-CM | POA: Diagnosis present

## 2019-07-07 LAB — BASIC METABOLIC PANEL
Anion gap: 9 (ref 5–15)
BUN: 8 mg/dL (ref 6–20)
CO2: 23 mmol/L (ref 22–32)
Calcium: 8.9 mg/dL (ref 8.9–10.3)
Chloride: 103 mmol/L (ref 98–111)
Creatinine, Ser: 0.79 mg/dL (ref 0.44–1.00)
GFR calc Af Amer: >60 mL/min (ref >60)
GFR calc non Af Amer: >60 mL/min (ref >60)
Glucose, Bld: 180 mg/dL — ABNORMAL HIGH (ref 70–99)
Potassium: 3.8 mmol/L (ref 3.5–5.1)
Sodium: 135 mmol/L (ref 135–145)

## 2019-07-07 LAB — CBC
HCT: 41.4 % (ref 36.0–46.0)
Hemoglobin: 13.3 g/dL (ref 12.0–15.0)
MCH: 26.5 pg (ref 26.0–34.0)
MCHC: 32.1 g/dL (ref 30.0–36.0)
MCV: 82.5 fL (ref 80.0–100.0)
Platelets: 355 10*3/uL (ref 150–400)
RBC: 5.02 MIL/uL (ref 3.87–5.11)
RDW: 14.5 % (ref 11.5–15.5)
WBC: 10.8 10*3/uL — ABNORMAL HIGH (ref 4.0–10.5)
nRBC: 0 % (ref 0.0–0.2)

## 2019-07-07 LAB — HEPATIC FUNCTION PANEL
ALT: 15 U/L (ref 0–44)
AST: 13 U/L — ABNORMAL LOW (ref 15–41)
Albumin: 3.1 g/dL — ABNORMAL LOW (ref 3.5–5.0)
Alkaline Phosphatase: 58 U/L (ref 38–126)
Bilirubin, Direct: 0.1 mg/dL (ref 0.0–0.2)
Indirect Bilirubin: 0.5 mg/dL (ref 0.3–0.9)
Total Bilirubin: 0.6 mg/dL (ref 0.3–1.2)
Total Protein: 5.6 g/dL — ABNORMAL LOW (ref 6.5–8.1)

## 2019-07-07 LAB — I-STAT BETA HCG BLOOD, ED (MC, WL, AP ONLY): I-stat hCG, quantitative: <5 m[IU]/mL (ref <5)

## 2019-07-07 MED ORDER — IBUPROFEN 800 MG PO TABS
800.0000 mg | ORAL_TABLET | Freq: Once | ORAL | Status: AC
  Administered 2019-07-07: 800 mg via ORAL
  Filled 2019-07-07: qty 1

## 2019-07-07 MED ORDER — IOHEXOL 300 MG/ML  SOLN
100.0000 mL | Freq: Once | INTRAMUSCULAR | Status: AC | PRN
  Administered 2019-07-07: 100 mL via INTRAVENOUS

## 2019-07-07 NOTE — ED Notes (Signed)
HCG preg negative
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
## 2019-10-28 ENCOUNTER — Other Ambulatory Visit: Payer: Medicaid Other
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
## 2020-01-09 ENCOUNTER — Emergency Department (HOSPITAL_COMMUNITY): Payer: Medicaid Other

## 2020-01-09 ENCOUNTER — Ambulatory Visit (INDEPENDENT_AMBULATORY_CARE_PROVIDER_SITE_OTHER): Payer: Medicaid Other | Admitting: Neurology

## 2020-01-09 ENCOUNTER — Encounter: Payer: Self-pay | Admitting: Neurology

## 2020-01-09 ENCOUNTER — Other Ambulatory Visit: Payer: Self-pay

## 2020-01-09 ENCOUNTER — Emergency Department (HOSPITAL_COMMUNITY)
Admission: EM | Admit: 2020-01-09 | Discharge: 2020-01-10 | Disposition: A | Discharge status: Home / Self Care | Payer: Medicaid Other | Attending: Emergency Medicine | Admitting: Emergency Medicine

## 2020-01-09 VITALS — BP 156/116 | HR 92 | Ht 64.0 in | Wt 296.0 lb

## 2020-01-09 DIAGNOSIS — I10 Essential (primary) hypertension: Secondary | ICD-10-CM | POA: Insufficient documentation

## 2020-01-09 DIAGNOSIS — H4710 Unspecified papilledema: Secondary | ICD-10-CM | POA: Diagnosis not present

## 2020-01-09 DIAGNOSIS — R030 Elevated blood-pressure reading, without diagnosis of hypertension: Secondary | ICD-10-CM | POA: Insufficient documentation

## 2020-01-09 DIAGNOSIS — R519 Headache, unspecified: Secondary | ICD-10-CM | POA: Diagnosis not present

## 2020-01-09 DIAGNOSIS — Z87891 Personal history of nicotine dependence: Secondary | ICD-10-CM | POA: Diagnosis not present

## 2020-01-09 LAB — BASIC METABOLIC PANEL
Anion gap: 8 (ref 5–15)
BUN: 11 mg/dL (ref 6–20)
CO2: 25 mmol/L (ref 22–32)
Calcium: 8.7 mg/dL — ABNORMAL LOW (ref 8.9–10.3)
Chloride: 105 mmol/L (ref 98–111)
Creatinine, Ser: 0.81 mg/dL (ref 0.44–1.00)
GFR, Estimated: >60 mL/min (ref >60)
Glucose, Bld: 100 mg/dL — ABNORMAL HIGH (ref 70–99)
Potassium: 3.9 mmol/L (ref 3.5–5.1)
Sodium: 138 mmol/L (ref 135–145)

## 2020-01-09 LAB — TROPONIN I (HIGH SENSITIVITY)
Troponin I (High Sensitivity): 18 ng/L — ABNORMAL HIGH (ref <18)
Troponin I (High Sensitivity): 14 ng/L (ref <18)

## 2020-01-09 LAB — CBC
HCT: 38 % (ref 36.0–46.0)
Hemoglobin: 11.6 g/dL — ABNORMAL LOW (ref 12.0–15.0)
MCH: 25.4 pg — ABNORMAL LOW (ref 26.0–34.0)
MCHC: 30.5 g/dL (ref 30.0–36.0)
MCV: 83.3 fL (ref 80.0–100.0)
Platelets: 347 10*3/uL (ref 150–400)
RBC: 4.56 MIL/uL (ref 3.87–5.11)
RDW: 14.4 % (ref 11.5–15.5)
WBC: 9.2 10*3/uL (ref 4.0–10.5)
nRBC: 0 % (ref 0.0–0.2)

## 2020-01-09 LAB — I-STAT BETA HCG BLOOD, ED (MC, WL, AP ONLY): I-stat hCG, quantitative: <5 m[IU]/mL (ref <5)

## 2020-01-09 NOTE — ED Triage Notes (Signed)
Reported was sent by Neurologist for further eval, d/t eye pressure, migraine and high BP. Stated was seen by PCP as well earlier today for chest tightness and was given NTG and Clonidine.

## 2020-01-09 NOTE — Patient Instructions (Addendum)
You have an appointment with Dr Theda Sers on Thursday March 15, 2020 at 9:45am.   You need to make a follow up with [facility].  IF your Blood Pressure is high and you need medications, propranolol may be something that they could consider that would help headache and blood pressure.

## 2020-01-10 MED ORDER — METOCLOPRAMIDE HCL 5 MG/ML IJ SOLN
10.0000 mg | Freq: Once | INTRAMUSCULAR | Status: AC
  Administered 2020-01-10: 10 mg via INTRAVENOUS
  Filled 2020-01-10: qty 2

## 2020-01-10 MED ORDER — DIPHENHYDRAMINE HCL 50 MG/ML IJ SOLN
25.0000 mg | Freq: Once | INTRAMUSCULAR | Status: AC
  Administered 2020-01-10: 25 mg via INTRAVENOUS
  Filled 2020-01-10: qty 1

## 2020-01-10 MED ORDER — KETOROLAC TROMETHAMINE 30 MG/ML IJ SOLN
15.0000 mg | Freq: Once | INTRAMUSCULAR | Status: AC
  Administered 2020-01-10: 15 mg via INTRAVENOUS
  Filled 2020-01-10: qty 1

## 2020-01-10 NOTE — Discharge Instructions (Addendum)
Continue your work-up with your outpatient primary care doctor and the neurologist and ophthalmologist.  If your symptoms change or worsen return to the emergency department.  Your blood pressure came down nicely after your headache was treated today.  Bad headaches can cause elevated blood pressure, however, you still need to continue the evaluation with your outpatient doctors.

## 2020-01-10 NOTE — ED Notes (Signed)
Patient verbalizes understanding of discharge instructions. Opportunity for questioning and answers were provided. Armband removed by staff, pt discharged from ED in wheelchair to home.   

## 2020-01-10 NOTE — ED Provider Notes (Signed)
[place] EMERGENCY DEPARTMENT Provider Note   CSN: [id number] Arrival date & time: 01/09/20  1720     History Chief Complaint  Patient presents with  . Hypertension    Martha Mathews is a 34 y.o. female.  Patient with past medical history notable for Chiari malformation, migraines, bipolar, presents to the emergency department with a chief complaint of hypertension.  She states that she has been having headaches and elevated blood pressure.  Reports that she is being seen by her PCP along with neurology.  She reports being told that her eye pressure appeared to be high by her eye doctor.  She was subsequently referred to neurology, who has ordered an outpatient MRI.  During her clinic visit, her blood pressure was noted to be 174/120.  She was sent to her PCPs office for management of this high blood pressure and was given nitroglycerin and clonidine.  She did not have any improvement while at the office, and was sent to the emergency department for the further treatment and evaluation.  She still complains of headache, but denies numbness, weakness, or tingling.  She denies any other associated symptoms.  The history is provided by the patient. No language interpreter was used.       Past Medical History:  Diagnosis Date  . Abnormal Pap smear   . Anemia    HISTORY W/2006 PREGNANCY  . Anxiety   . Bipolar 1 disorder ([place])   . 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 Chlamydia 2004  . Depression    not currently on meds, being treated- trying to find a med  . Dyslexia   . Hypertelorism   . Migraines    otc med prn w/pregnancy  . 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 Ringworm   . Urinary tract infection     Patient Active Problem List   Diagnosis Date Noted  . Vertigo--positional 09/14/2012  . Unplanned wanted pregnancy 09/04/2011  . Chiari I malformation ([place]) 05/30/2011  . Preventative health care 10/23/2010  .  Headache(784.0) 08/26/2010  . MAJOR DEPRESSIVE DISORDER RECURRENT EPISODE MILD 09/21/2007    Past Surgical History:  Procedure Laterality Date  . BILATERAL SALPINGECTOMY Bilateral 04/21/2012   Procedure: BILATERAL SALPINGECTOMY;  Surgeon: Cheri Fowler, MD;  Location: [facility];  Service: Obstetrics;  Laterality: Bilateral;  . CESAREAN SECTION    . CESAREAN SECTION N/A 04/21/2012   Procedure: CESAREAN SECTION;  Surgeon: Cheri Fowler, MD;  Location: [facility];  Service: Obstetrics;  Laterality: N/A;  Repeat  . CHOLECYSTECTOMY  2006  . EYE SURGERY     left eye surgery - lazy eye  . INDUCED ABORTION     vaccum on oct 2009  . MYRINGOTOMY    . TONSILLECTOMY       OB History    Gravida  3   Para  2   Term  2   Preterm  0   AB  1   Living  2     SAB      TAB  1   Ectopic      Multiple      Live Births  2           Family History  Problem Relation Age of Onset  . Cancer Maternal Grandmother        Breast cancer  . Cancer Paternal Grandmother        Breast cancer  . Stroke  Paternal Grandmother 76  . Diabetes Cousin   . Hypertension Cousin   . Hypertension Father   . Depression Father   . Other Father        radiation for cysts on brain  . Diabetes Mother   . Depression Mother     Social History   Tobacco Use  . Smoking status: Former Research scientist (life sciences)  . Smokeless tobacco: Never Used  . Tobacco comment: quit 2017  Vaping Use  . Vaping Use: Never used  Substance Use Topics  . Alcohol use: Yes    Comment: 1 mixed drink/weekend  . Drug use: No    Home Medications Prior to Admission medications   Medication Sig Start Date End Date Taking? Authorizing Provider  ADVAIR DISKUS 250-50 MCG/DOSE AEPB Inhale 1 puff into the lungs 2 (two) times daily. 08/23/19   [provider]  benzonatate (TESSALON) 100 MG capsule Take 1 capsule (100 mg total) by mouth every 8 (eight) hours. 03/13/17   Domenic Moras, PA-C  FLUoxetine (PROZAC) 40 MG capsule Take 40 mg by mouth  daily.  06/26/16   [provider]  fluticasone (FLONASE) 50 MCG/ACT nasal spray Place 2 sprays into both nostrils daily. 05/27/19   Couture, Cortni S, PA-C  gabapentin (NEURONTIN) 100 MG capsule Take 2 capsules by mouth at bedtime. 09/17/19   [provider]  SAPHRIS 5 MG SUBL 24 hr tablet PLACE 1 TABLET UNDER TONGUE AT BEDTIME (MAY USE 1/2 TAB DURING DAY AS NEEDED FOR ANXIETY) 05/14/16   [provider]  Vitamin D, Ergocalciferol, (DRISDOL) 1.25 MG (50000 UNIT) CAPS capsule Take 50,000 Units by mouth once a week. 08/23/19   [provider]    Allergies    Hydrocodone-acetaminophen and Adhesive [tape]  Review of Systems   Review of Systems  All other systems reviewed and are negative.   Physical Exam Updated Vital Signs BP (!) 158/98 (BP Location: Right Arm)   Pulse 68   Temp 98.8 F (37.1 C) (Oral)   Resp 12   Ht 5\' 4"  (1.626 m)   Wt 135.2 kg   SpO2 98%   BMI 51.15 kg/m   Physical Exam Vitals and nursing note reviewed.  Constitutional:      General: She is not in acute distress.    Appearance: She is well-developed. She is obese.  HENT:     Head: Normocephalic and atraumatic.  Eyes:     Conjunctiva/sclera: Conjunctivae normal.  Cardiovascular:     Rate and Rhythm: Normal rate and regular rhythm.     Heart sounds: No murmur heard.   Pulmonary:     Effort: Pulmonary effort is normal. No respiratory distress.     Breath sounds: Normal breath sounds.  Abdominal:     General: There is no distension.     Palpations: Abdomen is soft.     Tenderness: There is no abdominal tenderness.  Musculoskeletal:     Cervical back: Neck supple.     Comments: Moves all extremities  Skin:    General: Skin is warm and dry.  Neurological:     Mental Status: She is alert and oriented to person, place, and time.     Comments: CN III-XII intact, speech is clear, movements are goal oriented, no pronator drift, normal finger-to-nose, sensation and strength  intact throughout  Psychiatric:        Mood and Affect: Mood normal.        Behavior: Behavior normal.     ED Results / Procedures /  Treatments   Labs (all labs ordered are listed, but only abnormal results are displayed) Labs Reviewed  BASIC METABOLIC PANEL - Abnormal; Notable for the following components:      Result Value   Glucose, Bld 100 (*)    Calcium 8.7 (*)    All other components within normal limits  CBC - Abnormal; Notable for the following components:   Hemoglobin 11.6 (*)    MCH 25.4 (*)    All other components within normal limits  TROPONIN I (HIGH SENSITIVITY) - Abnormal; Notable for the following components:   Troponin I (High Sensitivity) 18 (*)    All other components within normal limits  I-STAT BETA HCG BLOOD, ED ([facility], [facility], [facility] ONLY)  TROPONIN I (HIGH SENSITIVITY)    EKG None  Radiology DG Chest 2 View  Result Date: 01/09/2020 CLINICAL DATA:  Chest pain EXAM: CHEST - 2 VIEW COMPARISON:  February 13, 2019 FINDINGS: The heart size and mediastinal contours are within normal limits. Both lungs are clear. The visualized skeletal structures are unremarkable. IMPRESSION: No active cardiopulmonary disease. Electronically Signed   By: Constance Holster M.D.   On: 01/09/2020 19:22    Procedures Procedures (including critical care time)  Medications Ordered in ED Medications  ketorolac (TORADOL) 30 MG/ML injection 15 mg (has no administration in time range)  metoCLOPramide (REGLAN) injection 10 mg (has no administration in time range)  diphenhydrAMINE (BENADRYL) injection 25 mg (has no administration in time range)    ED Course  I have reviewed the triage vital signs and the nursing notes.  Pertinent labs & imaging results that were available during my care of the patient were reviewed by me and considered in my medical decision making (see chart for details).    MDM Rules/Calculators/A&P                          Patient here with headache and elevated  blood pressure.  She was sent from her PCPs office due to elevated blood pressure that was not responding to treatment in the outpatient setting.  She has also been complaining of headache.  She is being followed by ophthalmology and neurology.  She has an outpatient MRI scheduled.  Her headache will be treated, and we will reassess the blood pressure.  Laboratory work-up is reassuring.  She did have mildly elevated initial troponin at 18, but repeat was 14.  She never had any chest pain.  This lab was ordered in triage.  EKG was normal.  I doubt ACS.  Patient feels significantly improved after headache cocktail.  Blood pressure is trending down nicely.  I do not feel that any further emergent work-up or treatment is indicated, and I believe the patient stable for discharge and outpatient follow-up.    Today's Vitals   01/10/20 0115 01/10/20 0116 01/10/20 0145 01/10/20 0200  BP: (!) 152/98  (!) 157/90 (!) 142/91  Pulse: 71  67 70  Resp: 15  11 13   Temp:      TempSrc:      SpO2: 100%  98% 97%  Weight:      Height:      PainSc:  8      Body mass index is 51.15 kg/m.  Final Clinical Impression(s) / ED Diagnoses Final diagnoses:  Nonintractable headache, unspecified chronicity pattern, unspecified headache type  Elevated blood pressure reading    Rx / DC Orders ED Discharge Orders    None  Montine Circle, PA-C 01/10/20 0215    Mesner, Corene Cornea, MD 01/10/20 [phone]

## 2020-01-17 ENCOUNTER — Other Ambulatory Visit: Payer: Self-pay

## 2020-01-17 ENCOUNTER — Ambulatory Visit
Admission: RE | Admit: 2020-01-17 | Discharge: 2020-01-17 | Disposition: A | Discharge status: Home / Self Care | Payer: Medicaid Other | Source: Ambulatory Visit | Attending: Neurology | Admitting: Neurology

## 2020-01-17 MED ORDER — GADOBENATE DIMEGLUMINE 529 MG/ML IV SOLN
20.0000 mL | Freq: Once | INTRAVENOUS | Status: AC | PRN
  Administered 2020-01-17: 20 mL via INTRAVENOUS

## 2020-02-18 DIAGNOSIS — I10 Essential (primary) hypertension: Secondary | ICD-10-CM

## 2020-02-18 HISTORY — DX: Essential (primary) hypertension: I10

## 2020-03-16 ENCOUNTER — Telehealth: Payer: Self-pay | Admitting: Neurology

## 2020-03-16 NOTE — Telephone Encounter (Signed)
-----   Message from [place], DO sent at 01/09/2020  3:10 PM EST ----- Pt went to see Dr. Delman Cheadle yesterday, ophthalmology.  Get records

## 2020-03-16 NOTE — Telephone Encounter (Signed)
Just send her a letter that it came to our attention that she no showed her visit and we would recommend rescheduling and letting us know when she gets an appointment (since they are not in our system)

## 2020-03-16 NOTE — Telephone Encounter (Signed)
Spoke with Dr Maurie Boettcher office and was informed that the patient no showed her recent appointment.

## 2020-03-16 NOTE — Telephone Encounter (Signed)
Ok

## 2021-01-15 ENCOUNTER — Emergency Department (HOSPITAL_COMMUNITY)
Admission: EM | Admit: 2021-01-15 | Discharge: 2021-01-15 | Disposition: A | Discharge status: Home / Self Care | Payer: No Typology Code available for payment source | Attending: Emergency Medicine | Admitting: Emergency Medicine

## 2021-01-15 ENCOUNTER — Emergency Department (HOSPITAL_COMMUNITY): Payer: No Typology Code available for payment source

## 2021-01-15 ENCOUNTER — Encounter (HOSPITAL_COMMUNITY): Payer: Self-pay

## 2021-01-15 ENCOUNTER — Other Ambulatory Visit: Payer: Self-pay

## 2021-01-15 DIAGNOSIS — W231XXA Caught, crushed, jammed, or pinched between stationary objects, initial encounter: Secondary | ICD-10-CM | POA: Diagnosis not present

## 2021-01-15 DIAGNOSIS — Y990 Civilian activity done for income or pay: Secondary | ICD-10-CM | POA: Diagnosis not present

## 2021-01-15 DIAGNOSIS — Z87891 Personal history of nicotine dependence: Secondary | ICD-10-CM | POA: Diagnosis not present

## 2021-01-15 DIAGNOSIS — S6991XA Unspecified injury of right wrist, hand and finger(s), initial encounter: Secondary | ICD-10-CM | POA: Insufficient documentation

## 2021-01-15 NOTE — ED Triage Notes (Signed)
Pt states that a box and metal slammed on her middle finger right hand while working around 0100.

## 2021-01-15 NOTE — ED Provider Notes (Signed)
[facility] DEPT Provider Note   CSN: [id number] Arrival date & time: 01/15/21  0326     History Chief Complaint  Patient presents with   Finger Injury    Martha Mathews is a 35 y.o. female.  Patient presents to the emergency department with a chief complaint of right middle finger pain.  She states that she slammed it between a box and a piece of metal tonight.  She works as a Designer, jewellery.  She denies any treatment prior to arrival.  She reports increased pain with movement and palpation.  The history is provided by the patient. No language interpreter was used.      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, TYPE I STABLE   Chlamydia 2004   Depression    not currently on meds, being treated- trying to find a med   Dyslexia    Hypertelorism    Migraines    otc med prn w/pregnancy   Neisseria gonorrhoeae    Aug 2010, treated by Ob   Ringworm    Urinary tract infection     Patient Active Problem List   Diagnosis Date Noted   Vertigo--positional 09/14/2012   Unplanned wanted pregnancy 09/04/2011   Chiari I malformation ([place]) 05/30/2011   Preventative health care 10/23/2010   Headache(784.0) 08/26/2010   MAJOR DEPRESSIVE DISORDER RECURRENT EPISODE MILD 09/21/2007    Past Surgical History:  Procedure Laterality Date   BILATERAL SALPINGECTOMY Bilateral 04/21/2012   Procedure: BILATERAL SALPINGECTOMY;  Surgeon: Cheri Fowler, MD;  Location: [facility];  Service: Obstetrics;  Laterality: Bilateral;   CESAREAN SECTION     CESAREAN SECTION N/A 04/21/2012   Procedure: CESAREAN SECTION;  Surgeon: Cheri Fowler, MD;  Location: [facility];  Service: Obstetrics;  Laterality: N/A;  Repeat   CHOLECYSTECTOMY  2006   EYE SURGERY     left eye surgery - lazy eye   INDUCED ABORTION     vaccum on oct 2009   MYRINGOTOMY      TONSILLECTOMY       OB History     Gravida  3   Para  2   Term  2   Preterm  0   AB  1   Living  2      SAB      IAB  1   Ectopic      Multiple      Live Births  2           Family History  Problem Relation Age of Onset   Cancer Maternal Grandmother        Breast cancer   Cancer Paternal Grandmother        Breast cancer   Stroke Paternal Grandmother 1   Diabetes Cousin    Hypertension Cousin    Hypertension Father    Depression Father    Other Father        radiation for cysts on brain   Diabetes Mother    Depression Mother     Social History   Tobacco Use   Smoking status: Former   Smokeless tobacco: Never   Tobacco comments:    quit 2017  Vaping Use   Vaping Use: Never used  Substance Use Topics   Alcohol use: Yes    Comment:  1 mixed drink/weekend   Drug use: No    Home Medications Prior to Admission medications   Medication Sig Start Date End Date Taking? Authorizing Provider  ADVAIR DISKUS 250-50 MCG/DOSE AEPB Inhale 1 puff into the lungs 2 (two) times daily. 08/23/19   [provider]  benzonatate (TESSALON) 100 MG capsule Take 1 capsule (100 mg total) by mouth every 8 (eight) hours. 03/13/17   Domenic Moras, PA-C  FLUoxetine (PROZAC) 40 MG capsule Take 40 mg by mouth daily.  06/26/16   [provider]  fluticasone (FLONASE) 50 MCG/ACT nasal spray Place 2 sprays into both nostrils daily. 05/27/19   Couture, Cortni S, PA-C  gabapentin (NEURONTIN) 100 MG capsule Take 2 capsules by mouth at bedtime. 09/17/19   [provider]  SAPHRIS 5 MG SUBL 24 hr tablet PLACE 1 TABLET UNDER TONGUE AT BEDTIME (MAY USE 1/2 TAB DURING DAY AS NEEDED FOR ANXIETY) 05/14/16   [provider]  Vitamin D, Ergocalciferol, (DRISDOL) 1.25 MG (50000 UNIT) CAPS capsule Take 50,000 Units by mouth once a week. 08/23/19   [provider]    Allergies    Hydrocodone-acetaminophen and Adhesive [tape]  Review of Systems   Review  of Systems  All other systems reviewed and are negative.  Physical Exam Updated Vital Signs BP (!) 161/94 (BP Location: Left Arm)   Pulse 63   Temp 98.1 F (36.7 C) (Oral)   Resp 16   LMP 12/18/2020   SpO2 96%   Physical Exam Vitals and nursing note reviewed.  Constitutional:      General: She is not in acute distress.    Appearance: She is well-developed.  HENT:     Head: Normocephalic and atraumatic.  Eyes:     Conjunctiva/sclera: Conjunctivae normal.  Cardiovascular:     Rate and Rhythm: Normal rate.     Heart sounds: No murmur heard. Pulmonary:     Effort: Pulmonary effort is normal. No respiratory distress.  Abdominal:     General: There is no distension.  Musculoskeletal:     Cervical back: Neck supple.     Comments: Moves all extremities  Skin:    General: Skin is warm and dry.  Neurological:     Mental Status: She is alert and oriented to person, place, and time.  Psychiatric:        Mood and Affect: Mood normal.        Behavior: Behavior normal.    ED Results / Procedures / Treatments   Labs (all labs ordered are listed, but only abnormal results are displayed) Labs Reviewed - No data to display  EKG None  Radiology DG Finger Middle Right  Result Date: 01/15/2021 CLINICAL DATA:  35 year old female status post blunt trauma from dropped heavy object on finger at work. EXAM: RIGHT MIDDLE FINGER 2+V COMPARISON:  Right hand series 02/07/2014. FINDINGS: Bone mineralization is within normal limits. There is no evidence of fracture or dislocation. There is no evidence of arthropathy or other focal bone abnormality. Soft tissue swelling along the 3rd proximal phalanx. No soft tissue gas. No radiopaque foreign body identified. IMPRESSION: Soft tissue swelling with no fracture or dislocation identified about the right 3rd finger. Electronically Signed   By: Genevie Ann M.D.   On: 01/15/2021 05:35    Procedures Procedures   Medications Ordered in ED Medications -  No data to display  ED Course  I have reviewed the triage vital signs and the nursing notes.  Pertinent labs & imaging  results that were available during my care of the patient were reviewed by me and considered in my medical decision making (see chart for details).    MDM Rules/Calculators/A&P                           Patient here with right middle finger injury.  She got her finger pinched between a box and a piece of metal tonight while at work.  There is no laceration.  There is a minor contusion to the posterior aspect of the right proximal third finger.  Plain films are negative.  We will place patient in a finger splint and discharge with PCP follow-up. Final Clinical Impression(s) / ED Diagnoses Final diagnoses:  Injury of finger of right hand, initial encounter    Rx / DC Orders ED Discharge Orders     None        Montine Circle, PA-C 01/15/21 0545    [name], MD 01/15/21 [phone]

## 2021-01-15 NOTE — ED Notes (Signed)
Finger splint applied to patient.

## 2021-03-25 ENCOUNTER — Other Ambulatory Visit: Payer: Self-pay | Admitting: Obstetrics & Gynecology

## 2021-04-23 NOTE — Progress Notes (Signed)
Surgical Instructions ? ? ? Your procedure is scheduled on Friday, March 10th, 2023. ? ? Report to [facility] Main Entrance "A" at 11:00 A.M., then check in with the Admitting office. ? Call this number if you have problems the morning of surgery: ? [phone] ? ? If you have any questions prior to your surgery date call [phone]: Open Monday-Friday 8am-4pm ? ? ? Remember: ? Do not eat or drink after midnight the night before your surgery ? ? Take these medicines the morning of surgery with A SIP OF WATER:  ? ?benzonatate (TESSALON) ?FLUoxetine (PROZAC) ?fluticasone (FLONASE)  ?ADVAIR DISKUS - if needed ?ALPRAZolam Duanne Moron) - if needed ? ?As of today, STOP taking any Aspirin (unless otherwise instructed by your surgeon) Aleve, Naproxen, Ibuprofen, Motrin, Advil, Goody's, BC's, all herbal medications, fish oil, and all vitamins. ? ? ? The day of surgery: ?         ?Do not wear jewelry or makeup ?Do not wear lotions, powders, perfumes, or deodorant. ?Do not shave 48 hours prior to surgery. ?Do not bring valuables to the hospital. ?Do not wear nail polish, gel polish, artificial nails, or any other type of covering on natural nails (fingers and toes) ?If you have artificial nails or gel coating that need to be removed by a nail salon, please have this removed prior to surgery. Artificial nails or gel coating may interfere with anesthesia's ability to adequately monitor your vital signs. ? ? ?[facility] is not responsible for any belongings or valuables. .  ? ?Do NOT Smoke (Tobacco/Vaping)  24 hours prior to your procedure ? ?If you use a CPAP at night, you may bring your mask for your overnight stay. ?  ?Contacts, glasses, hearing aids, dentures or partials may not be worn into surgery, please bring cases for these belongings ?  ?For patients admitted to the hospital, discharge time will be determined by your treatment team. ?  ?Patients discharged the day of surgery will not be allowed to drive home, and someone  needs to stay with them for 24 hours. ? ?NO VISITORS WILL BE ALLOWED IN PRE-OP WHERE PATIENTS ARE PREPPED FOR SURGERY.  ONLY 1 SUPPORT PERSON MAY BE PRESENT IN THE WAITING ROOM WHILE YOU ARE IN SURGERY.  IF YOU ARE TO BE ADMITTED, ONCE YOU ARE IN YOUR ROOM YOU WILL BE ALLOWED TWO (2) VISITORS. 1 (ONE) VISITOR MAY STAY OVERNIGHT BUT MUST ARRIVE TO THE ROOM BY 8pm.  Minor children may have two parents present. Special consideration for safety and communication needs will be reviewed on a case by case basis. ? ?Special instructions:   ? ?Oral Hygiene is also important to reduce your risk of infection.  Remember - BRUSH YOUR TEETH THE MORNING OF SURGERY WITH YOUR REGULAR TOOTHPASTE ? ? ?[facility]- Preparing For Surgery ? ?Before surgery, you can play an important role. Because skin is not sterile, your skin needs to be as free of germs as possible. You can reduce the number of germs on your skin by washing with CHG (chlorahexidine gluconate) Soap before surgery.  CHG is an antiseptic cleaner which kills germs and bonds with the skin to continue killing germs even after washing.   ? ? ?Please do not use if you have an allergy to CHG or antibacterial soaps. If your skin becomes reddened/irritated stop using the CHG.  ?Do not shave (including legs and underarms) for at least 48 hours prior to first CHG shower. It is OK to shave your face. ? ?  Please follow these instructions carefully. ?  ? ? Shower the NIGHT BEFORE SURGERY and the MORNING OF SURGERY with CHG Soap.  ? If you chose to wash your hair, wash your hair first as usual with your normal shampoo. After you shampoo, rinse your hair and body thoroughly to remove the shampoo.  Then [employer] and genitals (private parts) with your normal soap and rinse thoroughly to remove soap. ? ?After that Use CHG Soap as you would any other liquid soap. You can apply CHG directly to the skin and wash gently with a scrungie or a clean washcloth.  ? ?Apply the CHG Soap to your body  ONLY FROM THE NECK DOWN.  Do not use on open wounds or open sores. Avoid contact with your eyes, ears, mouth and genitals (private parts). Wash Face and genitals (private parts)  with your normal soap.  ? ?Wash thoroughly, paying special attention to the area where your surgery will be performed. ? ?Thoroughly rinse your body with warm water from the neck down. ? ?DO NOT shower/wash with your normal soap after using and rinsing off the CHG Soap. ? ?Pat yourself dry with a CLEAN TOWEL. ? ?Wear CLEAN PAJAMAS to bed the night before surgery ? ?Place CLEAN SHEETS on your bed the night before your surgery ? ?DO NOT SLEEP WITH PETS. ? ? ?Day of Surgery: ? ?Take a shower with CHG soap. ?Wear Clean/Comfortable clothing the morning of surgery ?Do not apply any deodorants/lotions.   ?Remember to brush your teeth WITH YOUR REGULAR TOOTHPASTE. ? ? ? ?COVID testing ? ?If you are going to stay overnight or be admitted after your procedure/surgery and require a pre-op COVID test, please follow these instructions after your COVID test  ? ?You are not required to quarantine however you are required to wear a well-fitting mask when you are out and around people not in your household.  If your mask becomes wet or soiled, replace with a new one. ? ?Wash your hands often with soap and water for 20 seconds or clean your hands with an alcohol-based hand sanitizer that contains at least 60% alcohol. ? ?Do not share personal items. ? ?Notify your provider: ?if you are in close contact with someone who has COVID  ?or if you develop a fever of 100.4 or greater, sneezing, cough, sore throat, shortness of breath or body aches. ? ?  ?Please read over the following fact sheets that you were given.   ?

## 2021-04-24 ENCOUNTER — Encounter (HOSPITAL_COMMUNITY): Payer: Self-pay | Admitting: Vascular Surgery

## 2021-04-24 ENCOUNTER — Encounter (HOSPITAL_COMMUNITY)
Admission: RE | Admit: 2021-04-24 | Discharge: 2021-04-24 | Disposition: A | Discharge status: Home / Self Care | Payer: Medicaid Other | Source: Ambulatory Visit | Attending: Obstetrics & Gynecology | Admitting: Obstetrics & Gynecology

## 2021-04-24 ENCOUNTER — Other Ambulatory Visit: Payer: Self-pay

## 2021-04-24 ENCOUNTER — Encounter (HOSPITAL_COMMUNITY): Payer: Self-pay

## 2021-04-24 VITALS — BP 172/118 | HR 74 | Temp 98.3°F | Resp 19 | Ht 64.0 in | Wt 291.4 lb

## 2021-04-24 DIAGNOSIS — G935 Compression of brain: Secondary | ICD-10-CM | POA: Insufficient documentation

## 2021-04-24 DIAGNOSIS — Z20822 Contact with and (suspected) exposure to covid-19: Secondary | ICD-10-CM | POA: Insufficient documentation

## 2021-04-24 DIAGNOSIS — I10 Essential (primary) hypertension: Secondary | ICD-10-CM | POA: Diagnosis not present

## 2021-04-24 DIAGNOSIS — Z01818 Encounter for other preprocedural examination: Secondary | ICD-10-CM | POA: Insufficient documentation

## 2021-04-24 DIAGNOSIS — F319 Bipolar disorder, unspecified: Secondary | ICD-10-CM | POA: Diagnosis not present

## 2021-04-24 DIAGNOSIS — N946 Dysmenorrhea, unspecified: Secondary | ICD-10-CM | POA: Insufficient documentation

## 2021-04-24 DIAGNOSIS — Z6841 Body Mass Index (BMI) 40.0 and over, adult: Secondary | ICD-10-CM | POA: Insufficient documentation

## 2021-04-24 LAB — CBC
HCT: 41.4 % (ref 36.0–46.0)
Hemoglobin: 13.4 g/dL (ref 12.0–15.0)
MCH: 26.2 pg (ref 26.0–34.0)
MCHC: 32.4 g/dL (ref 30.0–36.0)
MCV: 81 fL (ref 80.0–100.0)
Platelets: 340 10*3/uL (ref 150–400)
RBC: 5.11 MIL/uL (ref 3.87–5.11)
RDW: 14.2 % (ref 11.5–15.5)
WBC: 9.1 10*3/uL (ref 4.0–10.5)
nRBC: 0 % (ref 0.0–0.2)

## 2021-04-24 LAB — BASIC METABOLIC PANEL
Anion gap: 7 (ref 5–15)
BUN: 8 mg/dL (ref 6–20)
CO2: 28 mmol/L (ref 22–32)
Calcium: 8.8 mg/dL — ABNORMAL LOW (ref 8.9–10.3)
Chloride: 103 mmol/L (ref 98–111)
Creatinine, Ser: 0.78 mg/dL (ref 0.44–1.00)
GFR, Estimated: >60 mL/min (ref >60)
Glucose, Bld: 98 mg/dL (ref 70–99)
Potassium: 3.5 mmol/L (ref 3.5–5.1)
Sodium: 138 mmol/L (ref 135–145)

## 2021-04-24 LAB — TYPE AND SCREEN
ABO/RH(D): O POS
Antibody Screen: NEGATIVE

## 2021-04-24 LAB — SARS CORONAVIRUS 2 (TAT 6-24 HRS): SARS Coronavirus 2: NEGATIVE

## 2021-04-24 NOTE — Progress Notes (Signed)
Pt's BP 172/118 manually at [name]. Martha Mathews, Anesthesia PA, spoke with pt. Pt will f/u with her or Jeneen Rinks regarding BP issues prior to surgery ?

## 2021-04-24 NOTE — Progress Notes (Signed)
PCP - Andy Gauss, NP ?Cardiologist - denies ? ?PPM/ICD - denies ? ? ?Chest x-ray - 01/09/20 ?EKG - 04/24/21 at [name] ?Stress Test - denies ?ECHO - denies ?Cardiac Cath - denies ? ?Sleep Study - denies ? ?DM- denies ? ?Blood Thinner Instructions: n/a ?Aspirin Instructions: ASA stopped 2/27 per order from Dr. Alwyn Pea ? ?ERAS Protcol - no, NPO ? ? ?COVID TEST- 04/24/21 at [name] ? ? ?Anesthesia review: yes, 172/118 manual BP at [name], Ebony Hail saw pt in [name] ? ?Patient denies shortness of breath, fever, cough and chest pain at [name] appointment ? ? ?All instructions explained to the patient, with a verbal understanding of the material. Patient agrees to go over the instructions while at home for a better understanding. Patient also instructed to wear a mask in public after being tested for COVID-19. The opportunity to ask questions was provided. ?  ?

## 2021-04-24 NOTE — Progress Notes (Addendum)
Anesthesia Chart Review: ? Case: [id number] Date/Time: 04/26/21 1245  ? Procedure: TOTAL LAPAROSCOPIC HYSTERECTOMY WITH SALPINGECTOMY - POSSIBLE OPEN TOTAL ABDOMINAL HYSTERECTOMY  ? Anesthesia type: Regional  ? Pre-op diagnosis: DYSMENORRHEA  ? [facility] RO[facility] 07 / [facility]  ? Surgeons: Sanjuana Kava, MD  ? ?  ? ? ?DISCUSSION: Patient is a 36 year old female scheduled for the above procedure. I evaluated her at her 04/24/21 [name] visit due to elevated BP of 184/124. ? ?History includes never smoker, Bipolar 1 disorder, HTN, Chiari malformation Type 1 (stable low lying cerebellar tonsils 5 mm below the foramen magnum 01/17/20), hypertelorism, dyslexia, cholecystectomy (04/21/12). BMI is consistent with morbid obesity. ? ?She says she ran out of lisinopril a week ago. She said she had not had the money to fill and has had to relay on samples from PCP and Health [facility]. She is trying to get established with a mail-in pharmacy. She recently got a cash refund, and said she should now be able to afford a new refill. She plans to follow-up with her PCP right after her 04/24/21 [name] visit. I asked her to let me know of any medication changes. I also spoke with her PCP Andy Gauss, NP. It sounds like patient has had issues with housing insecurity in the past, so medication compliance has been at least intermittently challenging due to finances.  ? ?She denied known heart, lung, kidney, or liver issues. Says she has been mildly anemic, but not severe enough for transfusions. Her H/H at [name] were normal at 13.4/41.4. She has two children ages 44 and 61 years old. She denied chest pain or SOB. She may notice palpitations or fast heart rate with prolonged activity, but says her activity in not limited by this or by breathing. No pitting edema. She denied personal or family history of anesthesia complication.  She denied limited neck mobility or mouth opening. ? ?ASA has been on hold since 04/15/21.  ? ?04/24/21 presurgical COVID-19  test was negative.  ? ?I sent Dr. Alwyn Pea [phone]) a staff message with update regarding BP on 04/24/21. Since then I also reviewed with anesthesiologist Suzette Battiest, MD including EKG and comparison tracings. Her high lateral T wave abnormality is more pronounced in aVL when compared to several prior tracings--She has intermittently had upright or flat to mildly negative T wave abnormality. Given patient's significantly elevated BP and has not been on medications for at least a week, he advised postponing if surgery was considered elective to allow more time for consistent HTN control. Lisa from Dr. Tyler Aas office then called to report patient had been started on Lisinopril 20 mg daily. She said patient took a dose 04/24/21 PM and then started Q AM dosing on 04/25/21.  BP recheck at PCP office on 04/25/21 was reported as 130/88.  I updated anesthesiologist Oren Bracket, MD with recommendations as before. If case is not considered elective, then would re-evaluate BP on arrival. Also advised per guideline, Lisinopril would be held on the morning of surgery due to potential risk of intraoperative hypotension. I have updated Dr. Alwyn Pea. Lattie Haw reported that current plan is to postpone surgery to allow more time for HTN control. She will notify scheduling and the patient.  ? ? ?VS: BP (!) 184/124   Pulse 74   Temp 36.8 ?C   Resp 19   Ht '5\' 4"'$  (1.626 m)   Wt 132.2 kg   LMP 04/19/2021 (Exact Date)   SpO2 99%   BMI 50.02 kg/m?  184/125  LUE (automatic). BP 172/118 (manual RUE).  ?Patient and provider wore face masks. Heart RRR, no murmur noted. Lung clear. No pitting edema of the ankles.  ? ? ?PROVIDERS: ?[facility] [place] Location. Sees Andy Gauss, FNP. [phone]) ? ? ?LABS: Labs reviewed: Acceptable for surgery. ?(all labs ordered are listed, but only abnormal results are displayed) ? ?Labs Reviewed  ?BASIC METABOLIC PANEL - Abnormal; Notable for the following components:  ?     Result Value  ? Calcium 8.8 (*)   ? All other components within normal limits  ?SARS CORONAVIRUS 2 (TAT 6-24 HRS)  ?CBC  ?TYPE AND SCREEN  ? ? ?IMAGES: ?MRI Brain 01/17/20: ?IMPRESSION: ?- No acute abnormality.  Negative for mass lesion. 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 ? ? ?EKG: 04/24/21: ?Normal sinus rhythm ?Minimal voltage criteria for LVH, may be normal variant ( [employer] product ) ?Borderline ECG ?- Negative T wave in aVL and more non-specific in I. She has had mild intermittent T wave inversion/flatting in aVL on comparison EKGs, but not seen on last EKG from 01/10/20. ? ? ?CV: N/A ? ?Past Medical History:  ?Diagnosis Date  ? Abnormal Pap smear   ? Anemia   ? HISTORY W/2006 PREGNANCY  ? Anxiety   ? Bipolar 1 disorder ([place])   ? Chiari malformation   ? 3RD GRADE, TYPE I STABLE  ? Chlamydia 2004  ? Depression   ? not currently on meds, being treated- trying to find a med  ? Dyslexia   ? Hypertelorism   ? Hypertension 2022  ? Migraines   ? otc med prn w/pregnancy  ? Neisseria gonorrhoeae   ? Aug 2010, treated by Ob  ? Ringworm   ? Urinary tract infection   ? ? ?Past Surgical History:  ?Procedure Laterality Date  ? BILATERAL SALPINGECTOMY Bilateral 04/21/2012  ? Procedure: BILATERAL SALPINGECTOMY;  Surgeon: Cheri Fowler, MD;  Location: [facility];  Service: Obstetrics;  Laterality: Bilateral;  ? CESAREAN SECTION  2006  ? CESAREAN SECTION N/A 04/21/2012  ? Procedure: CESAREAN SECTION;  Surgeon: Cheri Fowler, MD;  Location: [facility];  Service: Obstetrics;  Laterality: N/A;  Repeat  ? CHOLECYSTECTOMY  2006  ? EYE SURGERY    ? left eye surgery - lazy eye  ? INDUCED ABORTION    ? vaccum on oct 2009  ? MYRINGOTOMY    ? as a child  ? TONSILLECTOMY    ? as a child  ? TUBAL LIGATION  2014  ? pt believes a portion of fallopian tube(s) were removed  ? ? ?MEDICATIONS: ? acetaminophen (TYLENOL) 500 MG tablet  ? ALPRAZolam (XANAX) 1 MG tablet  ? aspirin EC 81 MG tablet  ?  FLUoxetine (PROZAC) 40 MG capsule  ? gabapentin (NEURONTIN) 100 MG capsule  ? linaclotide (LINZESS) 145 MCG CAPS capsule  ? lisinopril (ZESTRIL) 20 MG tablet  ? QUEtiapine (SEROQUEL) 300 MG tablet  ? topiramate (TOPAMAX) 50 MG tablet  ? Vitamin D, Ergocalciferol, (DRISDOL) 1.25 MG (50000 UNIT) CAPS capsule  ? ?No current facility-administered medications for this encounter.  ? ? ?Myra Gianotti, PA-C ?Surgical Short Stay/Anesthesiology ?[facility] Phone [phone] ?[facility] Phone [phone] ?04/25/2021 3:49 PM ? ? ? ? ? ? ? ?

## 2021-04-26 ENCOUNTER — Ambulatory Visit (HOSPITAL_COMMUNITY)
Admission: RE | Admit: 2021-04-26 | Payer: Medicaid Other | Source: Home / Self Care | Admitting: Obstetrics & Gynecology

## 2021-04-26 SURGERY — HYSTERECTOMY, TOTAL, LAPAROSCOPIC, WITH SALPINGECTOMY
Anesthesia: Regional

## 2021-05-08 ENCOUNTER — Other Ambulatory Visit: Payer: Self-pay | Admitting: Obstetrics & Gynecology

## 2021-05-20 ENCOUNTER — Other Ambulatory Visit: Payer: Self-pay

## 2021-05-20 ENCOUNTER — Emergency Department (HOSPITAL_BASED_OUTPATIENT_CLINIC_OR_DEPARTMENT_OTHER): Payer: Medicaid Other

## 2021-05-20 ENCOUNTER — Encounter (HOSPITAL_BASED_OUTPATIENT_CLINIC_OR_DEPARTMENT_OTHER): Payer: Self-pay | Admitting: Emergency Medicine

## 2021-05-20 ENCOUNTER — Emergency Department (HOSPITAL_BASED_OUTPATIENT_CLINIC_OR_DEPARTMENT_OTHER)
Admission: EM | Admit: 2021-05-20 | Discharge: 2021-05-20 | Disposition: A | Discharge status: Home / Self Care | Payer: Medicaid Other | Attending: Emergency Medicine | Admitting: Emergency Medicine

## 2021-05-20 ENCOUNTER — Encounter: Payer: Self-pay | Admitting: Emergency Medicine

## 2021-05-20 ENCOUNTER — Ambulatory Visit
Admission: EM | Admit: 2021-05-20 | Discharge: 2021-05-20 | Payer: Medicaid Other | Attending: Internal Medicine | Admitting: Internal Medicine

## 2021-05-20 DIAGNOSIS — R519 Headache, unspecified: Secondary | ICD-10-CM | POA: Insufficient documentation

## 2021-05-20 DIAGNOSIS — I161 Hypertensive emergency: Secondary | ICD-10-CM

## 2021-05-20 DIAGNOSIS — R824 Acetonuria: Secondary | ICD-10-CM | POA: Diagnosis not present

## 2021-05-20 DIAGNOSIS — R809 Proteinuria, unspecified: Secondary | ICD-10-CM | POA: Diagnosis not present

## 2021-05-20 DIAGNOSIS — H53149 Visual discomfort, unspecified: Secondary | ICD-10-CM | POA: Insufficient documentation

## 2021-05-20 DIAGNOSIS — I10 Essential (primary) hypertension: Secondary | ICD-10-CM | POA: Insufficient documentation

## 2021-05-20 DIAGNOSIS — K047 Periapical abscess without sinus: Secondary | ICD-10-CM

## 2021-05-20 DIAGNOSIS — Z79899 Other long term (current) drug therapy: Secondary | ICD-10-CM | POA: Insufficient documentation

## 2021-05-20 LAB — CBC WITH DIFFERENTIAL/PLATELET
Abs Immature Granulocytes: 0.02 10*3/uL (ref 0.00–0.07)
Basophils Absolute: 0 10*3/uL (ref 0.0–0.1)
Basophils Relative: 0 %
Eosinophils Absolute: 0 10*3/uL (ref 0.0–0.5)
Eosinophils Relative: 0 %
HCT: 39.8 % (ref 36.0–46.0)
Hemoglobin: 12.7 g/dL (ref 12.0–15.0)
Immature Granulocytes: 0 %
Lymphocytes Relative: 24 %
Lymphs Abs: 2 10*3/uL (ref 0.7–4.0)
MCH: 25.2 pg — ABNORMAL LOW (ref 26.0–34.0)
MCHC: 31.9 g/dL (ref 30.0–36.0)
MCV: 79.1 fL — ABNORMAL LOW (ref 80.0–100.0)
Monocytes Absolute: 0.4 10*3/uL (ref 0.1–1.0)
Monocytes Relative: 5 %
Neutro Abs: 6 10*3/uL (ref 1.7–7.7)
Neutrophils Relative %: 71 %
Platelets: 312 10*3/uL (ref 150–400)
RBC: 5.03 MIL/uL (ref 3.87–5.11)
RDW: 14.2 % (ref 11.5–15.5)
WBC: 8.4 10*3/uL (ref 4.0–10.5)
nRBC: 0 % (ref 0.0–0.2)

## 2021-05-20 LAB — BASIC METABOLIC PANEL
Anion gap: 10 (ref 5–15)
BUN: 10 mg/dL (ref 6–20)
CO2: 24 mmol/L (ref 22–32)
Calcium: 9.1 mg/dL (ref 8.9–10.3)
Chloride: 103 mmol/L (ref 98–111)
Creatinine, Ser: 0.65 mg/dL (ref 0.44–1.00)
GFR, Estimated: >60 mL/min (ref >60)
Glucose, Bld: 94 mg/dL (ref 70–99)
Potassium: 3.6 mmol/L (ref 3.5–5.1)
Sodium: 137 mmol/L (ref 135–145)

## 2021-05-20 LAB — URINALYSIS, ROUTINE W REFLEX MICROSCOPIC
Bilirubin Urine: NEGATIVE
Glucose, UA: NEGATIVE mg/dL
Ketones, ur: 15 mg/dL — AB
Nitrite: NEGATIVE
Protein, ur: 100 mg/dL — AB
RBC / HPF: >50 RBC/hpf — ABNORMAL HIGH (ref 0–5)
Specific Gravity, Urine: 1.031 — ABNORMAL HIGH (ref 1.005–1.030)
pH: 6 (ref 5.0–8.0)

## 2021-05-20 LAB — PREGNANCY, URINE: Preg Test, Ur: NEGATIVE

## 2021-05-20 MED ORDER — OXYCODONE-ACETAMINOPHEN 5-325 MG PO TABS
1.0000 | ORAL_TABLET | Freq: Once | ORAL | Status: AC
  Administered 2021-05-20: 1 via ORAL
  Filled 2021-05-20: qty 1

## 2021-05-20 MED ORDER — HYDRALAZINE HCL 20 MG/ML IJ SOLN
10.0000 mg | Freq: Once | INTRAMUSCULAR | Status: AC
  Administered 2021-05-20: 10 mg via INTRAVENOUS
  Filled 2021-05-20: qty 1

## 2021-05-20 MED ORDER — IOHEXOL 300 MG/ML  SOLN
100.0000 mL | Freq: Once | INTRAMUSCULAR | Status: AC | PRN
  Administered 2021-05-20: 100 mL via INTRAVENOUS

## 2021-05-20 MED ORDER — AMOXICILLIN-POT CLAVULANATE 875-125 MG PO TABS: 1.0000 | ORAL_TABLET | Freq: Two times a day (BID) | ORAL | 0 refills | Status: DC

## 2021-05-20 MED ORDER — OXYCODONE-ACETAMINOPHEN 5-325 MG PO TABS
1.0000 | ORAL_TABLET | Freq: Four times a day (QID) | ORAL | 0 refills | Status: AC | PRN
Start: 2021-05-20 — End: 2021-05-25

## 2021-05-20 NOTE — ED Provider Notes (Signed)
?[facility] EMERGENCY DEPT ?Provider Note ? ? ?CSN: [id number] ?Arrival date & time: 05/20/21  1537 ? ?  ? ?History ?PMH: Chiari Malformation, Bipolar 1, Migraines, anemia, HTN ?Chief Complaint  ?Patient presents with  ? Hypertension  ? ? ?Martha Mathews is a 36 y.o. female. ?Emergency room after being sent over here by urgent care on blood pressure.  She says she originally went to urgent care when she is having right-sided lower dental pain started 2 days ago.  Dental pain is progressively worsened she feels like the right side of her neck is swelling. Pain radiates to the right side of her face and into her right neck.  She denies any fevers or chills.  No dental injury recently.  Urgent Care did not address her dental pain. I do see on chart review that they prescribed her Augmentin.  She was noted to be with systolic over 924 and diastolic over 268 urgent care.  Patient takes lisinopril at home for her blood pressure and did take this this morning.  She reportedly has been having trouble with elevated BP and her PCP was trying to manage it. She was at her OBGYN appt several days ago with a similar BP. She went and saw her PCP afterwards and was given medication to help her BP.  ?When asked she denies any chest pain, shortness of breath, nausea, vomiting, melena..  She does have associated headaches that suddenly started about 20 minutes ago.  Does note that she has a history of migraines and this does feel similar to that. She has associated photophobia. ? ? ?Hypertension ?Associated symptoms include headaches. Pertinent negatives include no chest pain, no abdominal pain and no shortness of breath.  ? ?  ? ?Home Medications ?Prior to Admission medications   ?Medication Sig Start Date End Date Taking? Authorizing Provider  ?oxyCODONE-acetaminophen (PERCOCET/ROXICET) 5-325 MG tablet Take 1 tablet by mouth every 6 (six) hours as needed for up to 5 days for severe pain. 05/20/21 05/25/21 Yes Daneil Beem,  Adora Fridge, PA-C  ?acetaminophen (TYLENOL) 500 MG tablet Take 1,000 mg by mouth every 6 (six) hours as needed for headache.    [provider]  ?ALPRAZolam Duanne Moron) 1 MG tablet Take 1 mg by mouth 2 (two) times daily as needed for anxiety. 01/09/21   [provider]  ?amoxicillin-clavulanate (AUGMENTIN) 875-125 MG tablet Take 1 tablet by mouth every 12 (twelve) hours. 05/20/21   Teodora Medici, FNP  ?aspirin EC 81 MG tablet Take 81 mg by mouth daily. Swallow whole.    [provider]  ?FLUoxetine (PROZAC) 40 MG capsule Take 40 mg by mouth daily.  06/26/16   [provider]  ?gabapentin (NEURONTIN) 100 MG capsule Take 200 mg by mouth at bedtime. 09/17/19   [provider]  ?linaclotide (LINZESS) 145 MCG CAPS capsule Take 145 mcg by mouth 2 (two) times a week.    [provider]  ?lisinopril (ZESTRIL) 20 MG tablet Take 20 mg by mouth daily.    [provider]  ?QUEtiapine (SEROQUEL) 300 MG tablet Take 300 mg by mouth at bedtime. 10/31/20   [provider]  ?topiramate (TOPAMAX) 50 MG tablet Take 50 mg by mouth daily.    [provider]  ?Vitamin D, Ergocalciferol, (DRISDOL) 1.25 MG (50000 UNIT) CAPS capsule Take 50,000 Units by mouth every Wednesday. 08/23/19   [provider]  ?   ? ?Allergies    ?Adhesive [tape], Hydrocodone-acetaminophen, and Iodine   ? ?Review of  Systems   ?Review of Systems  ?HENT:  Positive for dental problem.   ?Eyes:  Positive for photophobia.  ?Respiratory:  Negative for shortness of breath.   ?Cardiovascular:  Negative for chest pain.  ?Gastrointestinal:  Negative for abdominal pain, nausea and vomiting.  ?Neurological:  Positive for headaches.  ?All other systems reviewed and are negative. ? ?Physical Exam ?Updated Vital Signs ?BP (!) 179/100   Pulse 70   Temp 98.5 ?F (36.9 ?C)   Resp 11   LMP 04/21/2021   SpO2 100%  ?Physical Exam ?Vitals and nursing note reviewed.  ?Constitutional:   ?   General: She is not  in acute distress. ?   Appearance: Normal appearance. She is not ill-appearing, toxic-appearing or diaphoretic.  ?HENT:  ?   Head: Normocephalic and atraumatic.  ?   Jaw: No trismus.  ?   Comments: Slight swelling of right anterior neck.  ?   Nose: No nasal deformity.  ?   Mouth/Throat:  ?   Lips: Pink. No lesions.  ?   Mouth: Mucous membranes are moist. No injury, lacerations, oral lesions or angioedema.  ?   Pharynx: Oropharynx is clear. Uvula midline. No pharyngeal swelling, oropharyngeal exudate, posterior oropharyngeal erythema or uvula swelling.  ? ?   Comments: Tenderness to above tooth. I cannot visualize pulp. No discernable abscess.  ?Eyes:  ?   General: Gaze aligned appropriately. No scleral icterus.    ?   Right eye: No discharge.     ?   Left eye: No discharge.  ?   Conjunctiva/sclera: Conjunctivae normal.  ?   Right eye: Right conjunctiva is not injected. No exudate or hemorrhage. ?   Left eye: Left conjunctiva is not injected. No exudate or hemorrhage. ?   Pupils: Pupils are equal, round, and reactive to light.  ?Cardiovascular:  ?   Rate and Rhythm: Normal rate and regular rhythm.  ?   Pulses: Normal pulses.     ?     Radial pulses are 2+ on the right side and 2+ on the left side.  ?     Dorsalis pedis pulses are 2+ on the right side and 2+ on the left side.  ?   Heart sounds: Normal heart sounds, S1 normal and S2 normal. Heart sounds not distant. No murmur heard. ?  No friction rub. No gallop. No S3 or S4 sounds.  ?Pulmonary:  ?   Effort: Pulmonary effort is normal. No accessory muscle usage or respiratory distress.  ?   Breath sounds: Normal breath sounds. No stridor. No wheezing, rhonchi or rales.  ?Chest:  ?   Chest wall: No tenderness.  ?Abdominal:  ?   General: Abdomen is flat. Bowel sounds are normal. There is no distension.  ?   Palpations: Abdomen is soft. There is no mass or pulsatile mass.  ?   Tenderness: There is no abdominal tenderness. There is no guarding or rebound.   ?Musculoskeletal:  ?   Right lower leg: No edema.  ?   Left lower leg: No edema.  ?Skin: ?   General: Skin is warm and dry.  ?   Coloration: Skin is not jaundiced or pale.  ?   Findings: No bruising, erythema, lesion or rash.  ?Neurological:  ?   General: No focal deficit present.  ?   Mental Status: She is alert and oriented to person, place, and time.  ?   GCS: GCS eye subscore is 4. GCS verbal subscore is 5.  GCS motor subscore is 6.  ?Psychiatric:     ?   Mood and Affect: Mood normal.     ?   Behavior: Behavior normal. Behavior is cooperative.  ? ? ?ED Results / Procedures / Treatments   ?Labs ?(all labs ordered are listed, but only abnormal results are displayed) ?Labs Reviewed  ?CBC WITH DIFFERENTIAL/PLATELET - Abnormal; Notable for the following components:  ?    Result Value  ? MCV 79.1 (*)   ? MCH 25.2 (*)   ? All other components within normal limits  ?URINALYSIS, ROUTINE W REFLEX MICROSCOPIC - Abnormal; Notable for the following components:  ? Color, Urine ORANGE (*)   ? APPearance HAZY (*)   ? Specific Gravity, Urine 1.031 (*)   ? Hgb urine dipstick LARGE (*)   ? Ketones, ur 15 (*)   ? Protein, ur 100 (*)   ? Leukocytes,Ua SMALL (*)   ? RBC / HPF >50 (*)   ? All other components within normal limits  ?BASIC METABOLIC PANEL  ?PREGNANCY, URINE  ? ? ?EKG ?EKG Interpretation ? ?Date/Time:  Monday May 20 2021 15:50:14 EDT ?Ventricular Rate:  78 ?PR Interval:  145 ?QRS Duration: 96 ?QT Interval:  398 ?QTC Calculation: 762 ?R Axis:   25 ?Text Interpretation: Sinus rhythm since last tracing no significant change Confirmed by Malvin Johns [phone]) on 05/20/2021 5:17:53 PM ? ?Radiology ?CT Head Wo Contrast ? ?Result Date: 05/20/2021 ?CLINICAL DATA:  Headache, sudden, severe EXAM: CT HEAD WITHOUT CONTRAST TECHNIQUE: Contiguous axial images were obtained from the base of the skull through the vertex without intravenous contrast. RADIATION DOSE REDUCTION: This exam was performed according to the departmental  dose-optimization program which includes automated exposure control, adjustment of the mA and/or kV according to patient size and/or use of iterative reconstruction technique. COMPARISON:  Head CT 07/07/2019 12/13/2012. FINDINGS: B

## 2021-05-20 NOTE — ED Provider Notes (Signed)
?[facility] ? ? ? ?CSN: [id number] ?Arrival date & time: 05/20/21  1104 ? ? ?  ? ?History   ?Chief Complaint ?Chief Complaint  ?Patient presents with  ? Oral Swelling  ? ? ?HPI ?Martha Mathews is a 36 y.o. female.  ? ?Patient presents with right-sided facial and dental pain that has been present for approximately 2 days.  Patient reports pain is located in the right lower jaw and radiates back.  Denies any purulent drainage from mouth, fever, body aches, chills.  Denies any injury to the area.  Patient is taking Tylenol for pain with minimal improvement in pain.  She also has significantly elevated blood pressure and reports that her primary care has been trying to manage it.  She supposed to have a gynecological procedure a few days ago but was not able to have it completed because her blood pressure was 703J systolic.  She was sent from gynecology to her primary care that day who gave her blood pressure medication in office that helped improve her blood pressure.  She has a follow-up appointment in 3 days for blood pressure management.  She has been monitoring her blood pressure at home and has been getting 009F to 818E systolic.  Denies any associated headache, blurred vision, nausea, vomiting, chest pain, shortness of breath, dizziness. ? ? ? ?Past Medical History:  ?Diagnosis Date  ? Abnormal Pap smear   ? Anemia   ? HISTORY W/2006 PREGNANCY  ? Anxiety   ? Bipolar 1 disorder ([place])   ? Chiari malformation   ? 3RD GRADE, TYPE I STABLE  ? Chlamydia 2004  ? Depression   ? not currently on meds, being treated- trying to find a med  ? Dyslexia   ? Hypertelorism   ? Hypertension 2022  ? Migraines   ? otc med prn w/pregnancy  ? Neisseria gonorrhoeae   ? Aug 2010, treated by Ob  ? Ringworm   ? Urinary tract infection   ? ? ?Patient Active Problem List  ? Diagnosis Date Noted  ? Vertigo--positional 09/14/2012  ? Unplanned wanted pregnancy 09/04/2011  ? Chiari I malformation ([place]) 05/30/2011  ? Preventative  health care 10/23/2010  ? Headache(784.0) 08/26/2010  ? MAJOR DEPRESSIVE DISORDER RECURRENT EPISODE MILD 09/21/2007  ? ? ?Past Surgical History:  ?Procedure Laterality Date  ? BILATERAL SALPINGECTOMY Bilateral 04/21/2012  ? Procedure: BILATERAL SALPINGECTOMY;  Surgeon: Cheri Fowler, MD;  Location: [facility];  Service: Obstetrics;  Laterality: Bilateral;  ? CESAREAN SECTION  2006  ? CESAREAN SECTION N/A 04/21/2012  ? Procedure: CESAREAN SECTION;  Surgeon: Cheri Fowler, MD;  Location: [facility];  Service: Obstetrics;  Laterality: N/A;  Repeat  ? CHOLECYSTECTOMY  2006  ? EYE SURGERY    ? left eye surgery - lazy eye  ? INDUCED ABORTION    ? vaccum on oct 2009  ? MYRINGOTOMY    ? as a child  ? TONSILLECTOMY    ? as a child  ? TUBAL LIGATION  2014  ? pt believes a portion of fallopian tube(s) were removed  ? ? ?OB History   ? ? Gravida  ?3  ? Para  ?2  ? Term  ?2  ? Preterm  ?0  ? AB  ?1  ? Living  ?2  ?  ? ? SAB  ?   ? IAB  ?1  ? Ectopic  ?   ? Multiple  ?   ? Live Births  ?2  ?   ?  ?  ? ? ? ?  Home Medications   ? ?Prior to Admission medications   ?Medication Sig Start Date End Date Taking? Authorizing Provider  ?acetaminophen (TYLENOL) 500 MG tablet Take 1,000 mg by mouth every 6 (six) hours as needed for headache.   Yes [provider]  ?ALPRAZolam Duanne Moron) 1 MG tablet Take 1 mg by mouth 2 (two) times daily as needed for anxiety. 01/09/21  Yes [provider]  ?amoxicillin-clavulanate (AUGMENTIN) 875-125 MG tablet Take 1 tablet by mouth every 12 (twelve) hours. 05/20/21  Yes Oswaldo Conroy E, FNP  ?aspirin EC 81 MG tablet Take 81 mg by mouth daily. Swallow whole.   Yes [provider]  ?FLUoxetine (PROZAC) 40 MG capsule Take 40 mg by mouth daily.  06/26/16  Yes [provider]  ?gabapentin (NEURONTIN) 100 MG capsule Take 200 mg by mouth at bedtime. 09/17/19  Yes [provider]  ?linaclotide (LINZESS) 145 MCG CAPS capsule Take 145 mcg by mouth 2 (two) times a week.   Yes [provider]  ?lisinopril (ZESTRIL) 20 MG tablet Take 20 mg by mouth daily.   Yes [provider]  ?QUEtiapine (SEROQUEL) 300 MG tablet Take 300 mg by mouth at bedtime. 10/31/20  Yes [provider]  ?topiramate (TOPAMAX) 50 MG tablet Take 50 mg by mouth daily.   Yes [provider]  ?Vitamin D, Ergocalciferol, (DRISDOL) 1.25 MG (50000 UNIT) CAPS capsule Take 50,000 Units by mouth every Wednesday. 08/23/19  Yes [provider]  ? ? ?Family History ?Family History  ?Problem Relation Age of Onset  ? Cancer Maternal Grandmother   ?     Breast cancer  ? Cancer Paternal Grandmother   ?     Breast cancer  ? Stroke Paternal Grandmother 57  ? Diabetes Cousin   ? Hypertension Cousin   ? Hypertension Father   ? Depression Father   ? Other Father   ?     radiation for cysts on brain  ? Diabetes Mother   ? Depression Mother   ? ? ?Social History ?Social History  ? ?Tobacco Use  ? Smoking status: Never  ? Smokeless tobacco: Never  ?Vaping Use  ? Vaping Use: Never used  ?Substance Use Topics  ? Alcohol use: Not Currently  ? Drug use: Not Currently  ?  Comment: quit smoking marijuana 2017  ? ? ? ?Allergies   ?Adhesive [tape], Hydrocodone-acetaminophen, and Iodine ? ? ?Review of Systems ?Review of Systems ?Per HPI ? ?Physical Exam ?Triage Vital Signs ?ED Triage Vitals  ?Enc Vitals Group  ?   BP 05/20/21 1335 (!) 193/130  ?   Pulse Rate 05/20/21 1335 67  ?   Resp 05/20/21 1335 18  ?   Temp 05/20/21 1335 98 ?F (36.7 ?C)  ?   Temp Source 05/20/21 1335 Oral  ?   SpO2 05/20/21 1335 97 %  ?   Weight 05/20/21 1337 256 lb (116.1 kg)  ?   Height 05/20/21 1337 '5\' 6"'$  (1.676 m)  ?   Head Circumference --   ?   Peak Flow --   ?   Pain Score 05/20/21 1336 9  ?   Pain Loc --   ?   Pain Edu? --   ?   Excl. in Jud? --   ? ?No data found. ? ?Updated Vital Signs ?BP (S) (!) 214/122 (BP Location: Right Arm) Comment: Taken by [facility], NP  Pulse 67   Temp 98 ?F (36.7 ?C) (Oral)   Resp 18  Ht '5\' 6"'$  (1.676 m)   Wt 256  lb (116.1 kg)   LMP 04/21/2021   SpO2 97%   BMI 41.32 kg/m?  ? ?Visual Acuity ?Right Eye Distance:   ?Left Eye Distance:   ?Bilateral Distance:   ? ?Right Eye Near:   ?Left Eye Near:    ?Bilateral Near:    ? ?Physical Exam ?Constitutional:   ?   General: She is not in acute distress. ?   Appearance: Normal appearance. She is not toxic-appearing or diaphoretic.  ?HENT:  ?   Head: Normocephalic and atraumatic.  ?   Mouth/Throat:  ?   Lips: Pink.  ?   Mouth: Mucous membranes are moist.  ?   Dentition: Dental tenderness present.  ?   Comments: Tenderness to palpation to right lower dentition.  No obvious gingival swelling or erythema.  No obvious abscess.  No purulent drainage.  No obvious swelling. ?Eyes:  ?   Extraocular Movements: Extraocular movements intact.  ?   Conjunctiva/sclera: Conjunctivae normal.  ?   Pupils: Pupils are equal, round, and reactive to light.  ?Cardiovascular:  ?   Rate and Rhythm: Normal rate and regular rhythm.  ?   Pulses: Normal pulses.  ?   Heart sounds: Normal heart sounds.  ?Pulmonary:  ?   Effort: Pulmonary effort is normal. No respiratory distress.  ?   Breath sounds: Normal breath sounds.  ?Neurological:  ?   General: No focal deficit present.  ?   Mental Status: She is alert and oriented to person, place, and time. Mental status is at baseline.  ?   Cranial Nerves: Cranial nerves 2-12 are intact.  ?   Sensory: Sensation is intact.  ?   Motor: Motor function is intact.  ?   Coordination: Coordination is intact.  ?   Gait: Gait is intact.  ?Psychiatric:     ?   Mood and Affect: Mood normal.     ?   Behavior: Behavior normal.     ?   Thought Content: Thought content normal.     ?   Judgment: Judgment normal.  ? ? ? ?UC Treatments / Results  ?Labs ?(all labs ordered are listed, but only abnormal results are displayed) ?Labs Reviewed - No data to display ? ?EKG ? ? ?Radiology ?No results found. ? ?Procedures ?Procedures (including critical care time) ? ?Medications Ordered in  UC ?Medications - No data to display ? ?Initial Impression / Assessment and Plan / UC Course  ?I have reviewed the triage vital signs and the nursing notes. ? ?Pertinent labs & imaging results that were available duri

## 2021-05-20 NOTE — ED Triage Notes (Signed)
Pt arrives to ED from UC for hypertension. Pt reports she went to UC for right sided dental/facial pain that started x2 days ago. Her BP at UC was >595 systolic.  ?

## 2021-05-20 NOTE — ED Triage Notes (Signed)
Patient c/o right sided facial pain and swelling x 2 days.  Patient unsure if it's a sinus infection or dental problem.  Patient has taken Tylenol. ?

## 2021-05-20 NOTE — ED Notes (Signed)
Patient is being discharged from the Urgent Care and sent to the Emergency Department via POV . Per [place], NP, patient is in need of higher level of care due to elevated BP. Patient is aware and verbalizes understanding of plan of care.  ?Vitals:  ? 05/20/21 1335  ?BP: (!) 193/130  ?Pulse: 67  ?Resp: 18  ?Temp: 98 ?F (36.7 ?C)  ?SpO2: 97%  ?  ?

## 2021-05-20 NOTE — ED Notes (Signed)
Patient verbalizes understanding of discharge instructions. Opportunity for questioning and answers were provided. Patient discharged from ED.  °

## 2021-05-20 NOTE — Discharge Instructions (Signed)
You were given an antibiotic for possible dental infection.  Follow-up with dentist.  Please go to the hospital  as soon as you leave urgent care for further evaluation and management of significantly elevated blood pressure. ?

## 2021-05-20 NOTE — Discharge Instructions (Signed)
Please take antibiotics as Urgent Care prescribed. I have given you several resources for a dentist. You can try to schedule an appointment with our on call physician or use one of the dentists in the resource guide.  ?You should alternate tylenol and ibuprofen for pain. I have prescribed you a few Percocets for breakthrough pain. Please be careful taking these as they can make you drowsy. Do not drive prior to taking. ?Please follow up with your PCP regarding elevated blood pressure. ?

## 2021-06-18 NOTE — Progress Notes (Signed)
Attempted multiple times to contact pt to give pre-op instructions, pt cannot be reached.  ?

## 2021-06-18 NOTE — Anesthesia Preprocedure Evaluation (Addendum)
Anesthesia Evaluation  ?Patient identified by MRN, date of birth, ID band ?Patient awake ? ? ? ?Reviewed: ?Allergy & Precautions, NPO status , Patient's Chart, lab work & pertinent test results ? ?History of Anesthesia Complications ?(+) history of anesthetic complications ? ?Airway ?Mallampati: III ? ?TM Distance: >3 FB ?Neck ROM: Full ? ? ? Dental ? ?(+) Dental Advisory Given, Missing ?  ?Pulmonary ?neg pulmonary ROS,  ?  ?Pulmonary exam normal ? ? ? ? ? ? ? Cardiovascular ?hypertension, Pt. on medications ?Normal cardiovascular exam ? ? ?  ?Neuro/Psych ? Headaches, PSYCHIATRIC DISORDERS Anxiety Depression Bipolar Disorder  ?Type 1 chiari malformation ?Vertigo ? ?  ? GI/Hepatic ?negative GI ROS, Neg liver ROS,   ?Endo/Other  ?Morbid obesity ? Renal/GU ?negative Renal ROS  ? ?  ?Musculoskeletal ?negative musculoskeletal ROS ?(+)  ? Abdominal ?  ?Peds ? Hematology ?negative hematology ROS ?(+)   ?Anesthesia Other Findings ? ? Reproductive/Obstetrics ? ?  ? ? ? ? ? ? ? ? ? ? ? ? ? ?  ?  ? ? ? ? ? ? ? ?Anesthesia Physical ?Anesthesia Plan ? ?ASA: 3 ? ?Anesthesia Plan: General  ? ?Post-op Pain Management: Tylenol PO (pre-op)* and Celebrex PO (pre-op)*  ? ?Induction: Intravenous ? ?PONV Risk Score and Plan: 4 or greater and Treatment may vary due to age or medical condition, Ondansetron, Dexamethasone, Midazolam and Scopolamine patch - Pre-op ? ?Airway Management Planned: Oral ETT ? ?Additional Equipment: None ? ?Intra-op Plan:  ? ?Post-operative Plan: Extubation in OR ? ?Informed Consent: I have reviewed the patients History and Physical, chart, labs and discussed the procedure including the risks, benefits and alternatives for the proposed anesthesia with the patient or authorized representative who has indicated his/her understanding and acceptance.  ? ? ? ?Dental advisory given ? ?Plan Discussed with: CRNA and Anesthesiologist ? ?Anesthesia Plan Comments:   ? ? ? ? ? ?Anesthesia  Quick Evaluation ? ?

## 2021-06-19 ENCOUNTER — Inpatient Hospital Stay (HOSPITAL_COMMUNITY)
Admission: RE | Admit: 2021-06-19 | Discharge: 2021-06-21 | DRG: 940 | Disposition: A | Discharge status: Home / Self Care | Payer: Medicaid Other | Attending: Obstetrics & Gynecology | Admitting: Obstetrics & Gynecology

## 2021-06-19 ENCOUNTER — Encounter (HOSPITAL_COMMUNITY)
Admission: RE | Disposition: A | Discharge status: Home / Self Care | Payer: Self-pay | Source: Home / Self Care | Attending: Obstetrics & Gynecology

## 2021-06-19 ENCOUNTER — Encounter (HOSPITAL_COMMUNITY): Payer: Self-pay | Admitting: Obstetrics & Gynecology

## 2021-06-19 ENCOUNTER — Ambulatory Visit (HOSPITAL_COMMUNITY): Payer: Medicaid Other | Admitting: Certified Registered"

## 2021-06-19 ENCOUNTER — Other Ambulatory Visit: Payer: Self-pay

## 2021-06-19 ENCOUNTER — Ambulatory Visit (HOSPITAL_BASED_OUTPATIENT_CLINIC_OR_DEPARTMENT_OTHER): Payer: Medicaid Other | Admitting: Certified Registered"

## 2021-06-19 DIAGNOSIS — Z6841 Body Mass Index (BMI) 40.0 and over, adult: Secondary | ICD-10-CM

## 2021-06-19 DIAGNOSIS — N946 Dysmenorrhea, unspecified: Principal | ICD-10-CM | POA: Diagnosis present

## 2021-06-19 DIAGNOSIS — I10 Essential (primary) hypertension: Secondary | ICD-10-CM

## 2021-06-19 DIAGNOSIS — Z885 Allergy status to narcotic agent status: Secondary | ICD-10-CM

## 2021-06-19 DIAGNOSIS — F418 Other specified anxiety disorders: Secondary | ICD-10-CM

## 2021-06-19 DIAGNOSIS — N938 Other specified abnormal uterine and vaginal bleeding: Secondary | ICD-10-CM | POA: Diagnosis present

## 2021-06-19 DIAGNOSIS — N736 Female pelvic peritoneal adhesions (postinfective): Secondary | ICD-10-CM | POA: Diagnosis present

## 2021-06-19 DIAGNOSIS — Z888 Allergy status to other drugs, medicaments and biological substances status: Secondary | ICD-10-CM

## 2021-06-19 DIAGNOSIS — D251 Intramural leiomyoma of uterus: Secondary | ICD-10-CM | POA: Diagnosis present

## 2021-06-19 DIAGNOSIS — F319 Bipolar disorder, unspecified: Secondary | ICD-10-CM | POA: Diagnosis not present

## 2021-06-19 DIAGNOSIS — G8918 Other acute postprocedural pain: Principal | ICD-10-CM | POA: Diagnosis present

## 2021-06-19 DIAGNOSIS — Z90710 Acquired absence of both cervix and uterus: Secondary | ICD-10-CM | POA: Diagnosis present

## 2021-06-19 DIAGNOSIS — Z8249 Family history of ischemic heart disease and other diseases of the circulatory system: Secondary | ICD-10-CM

## 2021-06-19 HISTORY — PX: TOTAL LAPAROSCOPIC HYSTERECTOMY WITH SALPINGECTOMY: SHX6742

## 2021-06-19 HISTORY — PX: CYSTOSCOPY: SHX5120

## 2021-06-19 LAB — CBC
HCT: 41.2 % (ref 36.0–46.0)
HCT: 38.3 % (ref 36.0–46.0)
Hemoglobin: 12.8 g/dL (ref 12.0–15.0)
Hemoglobin: 12.1 g/dL (ref 12.0–15.0)
MCH: 25.8 pg — ABNORMAL LOW (ref 26.0–34.0)
MCH: 26.1 pg (ref 26.0–34.0)
MCHC: 31.1 g/dL (ref 30.0–36.0)
MCHC: 31.6 g/dL (ref 30.0–36.0)
MCV: 82.9 fL (ref 80.0–100.0)
MCV: 82.7 fL (ref 80.0–100.0)
Platelets: 336 10*3/uL (ref 150–400)
Platelets: 291 10*3/uL (ref 150–400)
RBC: 4.97 MIL/uL (ref 3.87–5.11)
RBC: 4.63 MIL/uL (ref 3.87–5.11)
RDW: 14.6 % (ref 11.5–15.5)
RDW: 14.6 % (ref 11.5–15.5)
WBC: 9.8 10*3/uL (ref 4.0–10.5)
WBC: 16.4 10*3/uL — ABNORMAL HIGH (ref 4.0–10.5)
nRBC: 0 % (ref 0.0–0.2)
nRBC: 0 % (ref 0.0–0.2)

## 2021-06-19 LAB — TYPE AND SCREEN
ABO/RH(D): O POS
Antibody Screen: NEGATIVE

## 2021-06-19 LAB — SURGICAL PCR SCREEN
MRSA, PCR: NEGATIVE
Staphylococcus aureus: NEGATIVE

## 2021-06-19 LAB — POCT PREGNANCY, URINE: Preg Test, Ur: NEGATIVE

## 2021-06-19 LAB — CREATININE, SERUM
Creatinine, Ser: 0.98 mg/dL (ref 0.44–1.00)
GFR, Estimated: >60 mL/min (ref >60)

## 2021-06-19 SURGERY — HYSTERECTOMY, TOTAL, LAPAROSCOPIC, WITH SALPINGECTOMY
Anesthesia: General | Site: Abdomen | Laterality: Bilateral

## 2021-06-19 MED ORDER — LABETALOL HCL 5 MG/ML IV SOLN
5.0000 mg | INTRAVENOUS | Status: AC | PRN
  Administered 2021-06-19 (×2): 5 mg via INTRAVENOUS

## 2021-06-19 MED ORDER — ORAL CARE MOUTH RINSE: 15.0000 mL | Freq: Once | OROMUCOSAL | Status: AC

## 2021-06-19 MED ORDER — KETOROLAC TROMETHAMINE 30 MG/ML IJ SOLN
30.0000 mg | Freq: Four times a day (QID) | INTRAMUSCULAR | Status: DC
  Administered 2021-06-19 – 2021-06-20 (×3): 30 mg via INTRAVENOUS
  Filled 2021-06-19 (×3): qty 1

## 2021-06-19 MED ORDER — CEFAZOLIN IN SODIUM CHLORIDE 3-0.9 GM/100ML-% IV SOLN: 3.0000 g | INTRAVENOUS | Status: DC

## 2021-06-19 MED ORDER — OXYCODONE HCL 5 MG PO TABS
10.0000 mg | ORAL_TABLET | ORAL | Status: DC | PRN
  Administered 2021-06-19 – 2021-06-21 (×5): 10 mg via ORAL
  Filled 2021-06-19 (×5): qty 2

## 2021-06-19 MED ORDER — FENTANYL CITRATE (PF) 250 MCG/5ML IJ SOLN
INTRAMUSCULAR | Status: AC
  Filled 2021-06-19: qty 5

## 2021-06-19 MED ORDER — MIDAZOLAM HCL 2 MG/2ML IJ SOLN
INTRAMUSCULAR | Status: DC | PRN
  Administered 2021-06-19: 2 mg via INTRAVENOUS

## 2021-06-19 MED ORDER — PANTOPRAZOLE SODIUM 40 MG IV SOLR
40.0000 mg | Freq: Every day | INTRAVENOUS | Status: DC
  Administered 2021-06-19: 40 mg via INTRAVENOUS
  Filled 2021-06-19: qty 10

## 2021-06-19 MED ORDER — MUPIROCIN 2 % EX OINT: 1.0000 "application " | TOPICAL_OINTMENT | Freq: Once | CUTANEOUS | Status: DC

## 2021-06-19 MED ORDER — BUPIVACAINE HCL (PF) 0.25 % IJ SOLN
INTRAMUSCULAR | Status: AC
  Filled 2021-06-19: qty 30

## 2021-06-19 MED ORDER — OXYCODONE HCL 5 MG/5ML PO SOLN: 5.0000 mg | Freq: Once | ORAL | Status: DC | PRN

## 2021-06-19 MED ORDER — FENTANYL CITRATE (PF) 250 MCG/5ML IJ SOLN
INTRAMUSCULAR | Status: DC | PRN
  Administered 2021-06-19 (×5): 50 ug via INTRAVENOUS

## 2021-06-19 MED ORDER — ALBUMIN HUMAN 5 % IV SOLN: INTRAVENOUS | Status: DC | PRN

## 2021-06-19 MED ORDER — CELECOXIB 200 MG PO CAPS
200.0000 mg | ORAL_CAPSULE | Freq: Once | ORAL | Status: AC
  Administered 2021-06-19: 200 mg via ORAL
  Filled 2021-06-19: qty 1

## 2021-06-19 MED ORDER — METHYLENE BLUE 1 % INJ SOLN
INTRAVENOUS | Status: AC
  Filled 2021-06-19: qty 10

## 2021-06-19 MED ORDER — ACETAMINOPHEN 500 MG PO TABS
1000.0000 mg | ORAL_TABLET | Freq: Once | ORAL | Status: AC
  Administered 2021-06-19: 1000 mg via ORAL
  Filled 2021-06-19: qty 2

## 2021-06-19 MED ORDER — SUGAMMADEX SODIUM 200 MG/2ML IV SOLN
INTRAVENOUS | Status: DC | PRN
  Administered 2021-06-19: 300 mg via INTRAVENOUS

## 2021-06-19 MED ORDER — ONDANSETRON HCL 4 MG/2ML IJ SOLN: 4.0000 mg | Freq: Once | INTRAMUSCULAR | Status: DC | PRN

## 2021-06-19 MED ORDER — CEFAZOLIN SODIUM-DEXTROSE 2-4 GM/100ML-% IV SOLN
2.0000 g | INTRAVENOUS | Status: AC
  Administered 2021-06-19: 3 g via INTRAVENOUS
  Filled 2021-06-19: qty 100

## 2021-06-19 MED ORDER — KETOROLAC TROMETHAMINE 30 MG/ML IJ SOLN
INTRAMUSCULAR | Status: DC | PRN
  Administered 2021-06-19: 30 mg via INTRAVENOUS

## 2021-06-19 MED ORDER — ACETAMINOPHEN 500 MG PO TABS
1000.0000 mg | ORAL_TABLET | Freq: Four times a day (QID) | ORAL | Status: DC
  Administered 2021-06-19 – 2021-06-21 (×6): 1000 mg via ORAL
  Filled 2021-06-19 (×6): qty 2

## 2021-06-19 MED ORDER — ONDANSETRON HCL 4 MG PO TABS: 4.0000 mg | ORAL_TABLET | Freq: Four times a day (QID) | ORAL | Status: DC | PRN

## 2021-06-19 MED ORDER — QUETIAPINE FUMARATE 300 MG PO TABS
300.0000 mg | ORAL_TABLET | Freq: Every day | ORAL | Status: DC
Start: 2021-06-19 — End: 2021-06-21
  Administered 2021-06-19 – 2021-06-20 (×2): 300 mg via ORAL
  Filled 2021-06-19 (×3): qty 1

## 2021-06-19 MED ORDER — MENTHOL 3 MG MT LOZG: 1.0000 | LOZENGE | OROMUCOSAL | Status: DC | PRN

## 2021-06-19 MED ORDER — PROPOFOL 10 MG/ML IV BOLUS
INTRAVENOUS | Status: DC | PRN
  Administered 2021-06-19: 300 mg via INTRAVENOUS

## 2021-06-19 MED ORDER — IBUPROFEN 600 MG PO TABS: 600.0000 mg | ORAL_TABLET | Freq: Four times a day (QID) | ORAL | Status: DC

## 2021-06-19 MED ORDER — DEXAMETHASONE SODIUM PHOSPHATE 10 MG/ML IJ SOLN
INTRAMUSCULAR | Status: DC | PRN
Start: 2021-06-19 — End: 2021-06-19
  Administered 2021-06-19: 10 mg via INTRAVENOUS

## 2021-06-19 MED ORDER — GABAPENTIN 100 MG PO CAPS
200.0000 mg | ORAL_CAPSULE | Freq: Two times a day (BID) | ORAL | Status: DC
  Administered 2021-06-19 – 2021-06-21 (×5): 200 mg via ORAL
  Filled 2021-06-19 (×5): qty 2

## 2021-06-19 MED ORDER — PROPOFOL 10 MG/ML IV BOLUS
INTRAVENOUS | Status: AC
  Filled 2021-06-19: qty 20

## 2021-06-19 MED ORDER — ONDANSETRON HCL 4 MG/2ML IJ SOLN
INTRAMUSCULAR | Status: DC | PRN
  Administered 2021-06-19: 4 mg via INTRAVENOUS

## 2021-06-19 MED ORDER — ZOLPIDEM TARTRATE 5 MG PO TABS: 5.0000 mg | ORAL_TABLET | Freq: Every evening | ORAL | Status: DC | PRN

## 2021-06-19 MED ORDER — LIDOCAINE 2% (20 MG/ML) 5 ML SYRINGE
INTRAMUSCULAR | Status: DC | PRN
  Administered 2021-06-19: 60 mg via INTRAVENOUS

## 2021-06-19 MED ORDER — TOPIRAMATE 25 MG PO TABS
50.0000 mg | ORAL_TABLET | Freq: Every day | ORAL | Status: DC
  Filled 2021-06-19 (×3): qty 2

## 2021-06-19 MED ORDER — METHYLENE BLUE 1 % INJ SOLN
INTRAVENOUS | Status: DC | PRN
  Administered 2021-06-19: 50 mg via INTRAVENOUS

## 2021-06-19 MED ORDER — ROCURONIUM BROMIDE 10 MG/ML (PF) SYRINGE
PREFILLED_SYRINGE | INTRAVENOUS | Status: DC | PRN
  Administered 2021-06-19: 30 mg via INTRAVENOUS
  Administered 2021-06-19: 70 mg via INTRAVENOUS
  Administered 2021-06-19 (×2): 50 mg via INTRAVENOUS

## 2021-06-19 MED ORDER — LACTATED RINGERS IV SOLN: INTRAVENOUS | Status: DC

## 2021-06-19 MED ORDER — ENOXAPARIN SODIUM 40 MG/0.4ML IJ SOSY
40.0000 mg | PREFILLED_SYRINGE | INTRAMUSCULAR | Status: DC
  Administered 2021-06-19 – 2021-06-21 (×2): 40 mg via SUBCUTANEOUS
  Filled 2021-06-19 (×2): qty 0.4

## 2021-06-19 MED ORDER — FLUOXETINE HCL 20 MG PO CAPS
40.0000 mg | ORAL_CAPSULE | Freq: Every day | ORAL | Status: DC
  Administered 2021-06-20 – 2021-06-21 (×2): 40 mg via ORAL
  Filled 2021-06-19 (×2): qty 2

## 2021-06-19 MED ORDER — ONDANSETRON HCL 4 MG/2ML IJ SOLN: 4.0000 mg | Freq: Four times a day (QID) | INTRAMUSCULAR | Status: DC | PRN

## 2021-06-19 MED ORDER — HYDROMORPHONE HCL 1 MG/ML IJ SOLN
1.0000 mg | INTRAMUSCULAR | Status: DC | PRN
  Administered 2021-06-19: 1 mg via INTRAVENOUS
  Filled 2021-06-19: qty 1

## 2021-06-19 MED ORDER — MIDAZOLAM HCL 2 MG/2ML IJ SOLN
INTRAMUSCULAR | Status: AC
  Filled 2021-06-19: qty 2

## 2021-06-19 MED ORDER — CHLORHEXIDINE GLUCONATE 0.12 % MT SOLN
15.0000 mL | Freq: Once | OROMUCOSAL | Status: AC
Start: 2021-06-19 — End: 2021-06-19
  Administered 2021-06-19: 15 mL via OROMUCOSAL
  Filled 2021-06-19: qty 15

## 2021-06-19 MED ORDER — DEXMEDETOMIDINE (PRECEDEX) IN NS 20 MCG/5ML (4 MCG/ML) IV SYRINGE
PREFILLED_SYRINGE | INTRAVENOUS | Status: DC | PRN
  Administered 2021-06-19: 8 ug via INTRAVENOUS
  Administered 2021-06-19: 4 ug via INTRAVENOUS
  Administered 2021-06-19: 8 ug via INTRAVENOUS

## 2021-06-19 MED ORDER — FENTANYL CITRATE (PF) 100 MCG/2ML IJ SOLN
25.0000 ug | INTRAMUSCULAR | Status: DC | PRN
  Administered 2021-06-19 (×2): 50 ug via INTRAVENOUS

## 2021-06-19 MED ORDER — SENNOSIDES-DOCUSATE SODIUM 8.6-50 MG PO TABS
1.0000 | ORAL_TABLET | Freq: Every evening | ORAL | Status: DC | PRN
  Administered 2021-06-20: 1 via ORAL
  Filled 2021-06-19: qty 1

## 2021-06-19 MED ORDER — BUPIVACAINE HCL (PF) 0.25 % IJ SOLN
INTRAMUSCULAR | Status: DC | PRN
  Administered 2021-06-19: 30 mL

## 2021-06-19 MED ORDER — LABETALOL HCL 5 MG/ML IV SOLN
5.0000 mg | Freq: Once | INTRAVENOUS | Status: AC
  Administered 2021-06-19: 5 mg via INTRAVENOUS
  Filled 2021-06-19: qty 4

## 2021-06-19 MED ORDER — LISINOPRIL 20 MG PO TABS
20.0000 mg | ORAL_TABLET | Freq: Every day | ORAL | Status: DC
  Administered 2021-06-20 – 2021-06-21 (×2): 20 mg via ORAL
  Filled 2021-06-19 (×2): qty 1

## 2021-06-19 MED ORDER — LABETALOL HCL 5 MG/ML IV SOLN
20.0000 mg | Freq: Once | INTRAVENOUS | Status: AC
  Administered 2021-06-19: 20 mg via INTRAVENOUS
  Filled 2021-06-19: qty 4

## 2021-06-19 MED ORDER — SODIUM CHLORIDE 0.9 % IR SOLN
Status: DC | PRN
  Administered 2021-06-19: 1 via INTRAVESICAL

## 2021-06-19 MED ORDER — LABETALOL HCL 5 MG/ML IV SOLN: 5.0000 mg | INTRAVENOUS | Status: DC | PRN

## 2021-06-19 MED ORDER — LABETALOL HCL 5 MG/ML IV SOLN
INTRAVENOUS | Status: AC
  Filled 2021-06-19: qty 4

## 2021-06-19 MED ORDER — SIMETHICONE 80 MG PO CHEW
80.0000 mg | CHEWABLE_TABLET | Freq: Two times a day (BID) | ORAL | Status: DC
  Administered 2021-06-19 – 2021-06-21 (×4): 80 mg via ORAL
  Filled 2021-06-19 (×4): qty 1

## 2021-06-19 MED ORDER — ALPRAZOLAM 0.25 MG PO TABS: 1.0000 mg | ORAL_TABLET | Freq: Two times a day (BID) | ORAL | Status: DC | PRN

## 2021-06-19 MED ORDER — FENTANYL CITRATE (PF) 100 MCG/2ML IJ SOLN
INTRAMUSCULAR | Status: AC
  Filled 2021-06-19: qty 2

## 2021-06-19 MED ORDER — OXYCODONE HCL 5 MG PO TABS: 5.0000 mg | ORAL_TABLET | Freq: Once | ORAL | Status: DC | PRN

## 2021-06-19 SURGICAL SUPPLY — 41 items
APPLICATOR ARISTA FLEXITIP XL (MISCELLANEOUS) ×1 IMPLANT
CABLE HIGH FREQUENCY MONO STRZ (ELECTRODE) ×3 IMPLANT
COVER MAYO STAND STRL (DRAPES) ×3 IMPLANT
DERMABOND ADHESIVE PROPEN (GAUZE/BANDAGES/DRESSINGS) ×1
DERMABOND ADVANCED .7 DNX6 (GAUZE/BANDAGES/DRESSINGS) IMPLANT
GLOVE BIO SURGEON STRL SZ7 (GLOVE) ×9 IMPLANT
GLOVE SURG UNDER POLY LF SZ7 (GLOVE) ×12 IMPLANT
GOWN STRL REUS W/ TWL LRG LVL3 (GOWN DISPOSABLE) ×6 IMPLANT
GOWN STRL REUS W/TWL LRG LVL3 (GOWN DISPOSABLE) ×8
HEMOSTAT ARISTA ABSORB 3G PWDR (HEMOSTASIS) ×1 IMPLANT
HIBICLENS CHG 4% 4OZ BTL (MISCELLANEOUS) ×3 IMPLANT
IRRIG SUCT STRYKERFLOW 2 WTIP (MISCELLANEOUS) ×3
IRRIGATION SUCT STRKRFLW 2 WTP (MISCELLANEOUS) ×2 IMPLANT
KIT TURNOVER KIT B (KITS) ×3 IMPLANT
LIGASURE VESSEL 5MM BLUNT TIP (ELECTROSURGICAL) ×3 IMPLANT
MANIPULATOR ADVINCU DEL 2.5 PL (MISCELLANEOUS) ×1 IMPLANT
NS IRRIG 1000ML POUR BTL (IV SOLUTION) ×3 IMPLANT
PACK LAPAROSCOPY BASIN (CUSTOM PROCEDURE TRAY) ×3 IMPLANT
PACK TRENDGUARD 450 HYBRID PRO (MISCELLANEOUS) IMPLANT
PROTECTOR NERVE ULNAR (MISCELLANEOUS) ×6 IMPLANT
SCISSORS LAP 5X35 DISP (ENDOMECHANICALS) ×3 IMPLANT
SET CYSTO W/LG BORE CLAMP LF (SET/KITS/TRAYS/PACK) ×1 IMPLANT
SET TRI-LUMEN FLTR TB AIRSEAL (TUBING) ×1 IMPLANT
SLEEVE ENDOPATH XCEL 5M (ENDOMECHANICALS) ×3 IMPLANT
SUT MNCRL AB 4-0 PS2 18 (SUTURE) ×5 IMPLANT
SUT VIC AB 0 CT1 18XCR BRD8 (SUTURE) IMPLANT
SUT VIC AB 0 CT1 8-18 (SUTURE) ×6
SUT VIC AB 2-0 CT1 (SUTURE) IMPLANT
SUT VICRYL 0 UR6 27IN ABS (SUTURE) ×3 IMPLANT
SUT VLOC 180 0 9IN  GS21 (SUTURE) ×3
SUT VLOC 180 0 9IN GS21 (SUTURE) IMPLANT
SYR 10ML LL (SYRINGE) ×1 IMPLANT
SYR 50ML LL SCALE MARK (SYRINGE) ×1 IMPLANT
TOWEL GREEN STERILE FF (TOWEL DISPOSABLE) ×7 IMPLANT
TRAY FOLEY W/BAG SLVR 14FR (SET/KITS/TRAYS/PACK) ×3 IMPLANT
TRENDGUARD 450 HYBRID PRO PACK (MISCELLANEOUS) ×3
TROCAR PORT AIRSEAL 8X120 (TROCAR) ×1 IMPLANT
TROCAR XCEL NON-BLD 11X100MML (ENDOMECHANICALS) ×3 IMPLANT
TROCAR XCEL NON-BLD 5MMX100MML (ENDOMECHANICALS) ×3 IMPLANT
UNDERPAD 30X36 HEAVY ABSORB (UNDERPADS AND DIAPERS) ×3 IMPLANT
WARMER LAPAROSCOPE (MISCELLANEOUS) ×3 IMPLANT

## 2021-06-19 NOTE — Progress Notes (Signed)
Patient vitals on arrival ?Pinn, MD made aware ?New orders for labetalol '20mg'$  IV x1 placed ? ? ? 06/19/21 1501  ?Vitals  ?Temp 98.8 ?F (37.1 ?C)  ?Temp Source Oral  ?BP (!) 192/115  ?MAP (mmHg) 137  ?BP Location Right Arm  ?BP Method Automatic  ?Patient Position (if appropriate) Lying  ?Pulse Rate 97  ?Pulse Rate Source Monitor  ?Resp 16  ?MEWS COLOR  ?MEWS Score Color Green  ?Oxygen Therapy  ?SpO2 95 %  ?O2 Device Room Air  ?MEWS Score  ?MEWS Temp 0  ?MEWS Systolic 0  ?MEWS Pulse 0  ?MEWS RR 0  ?MEWS LOC 0  ?MEWS Score 0  ? ? ?

## 2021-06-19 NOTE — Anesthesia Postprocedure Evaluation (Signed)
Anesthesia Post Note ? ?Patient: Martha Mathews ? ?Procedure(s) Performed: TOTAL LAPAROSCOPIC HYSTERECTOMY WITH SALPINGECTOMY (Bilateral: Abdomen) ?CYSTOSCOPY ? ?  ? ?Patient location during evaluation: PACU ?Anesthesia Type: General ?Level of consciousness: awake and alert ?Pain management: pain level controlled ?Vital Signs Assessment: post-procedure vital signs reviewed and stable ?Respiratory status: spontaneous breathing, nonlabored ventilation, respiratory function stable and patient connected to nasal cannula oxygen ?Cardiovascular status: blood pressure returned to baseline and stable ?Postop Assessment: no apparent nausea or vomiting ?Anesthetic complications: no ? ? ?No notable events documented. ? ?Last Vitals:  ?Vitals:  ? 06/19/21 1410 06/19/21 1501  ?BP: (!) 173/102 (!) 192/115  ?Pulse: 75 97  ?Resp: 12 16  ?Temp:  37.1 ?C  ?SpO2: 100% 95%  ?  ?Last Pain:  ?Vitals:  ? 06/19/21 1542  ?TempSrc:   ?PainSc: 4   ? ? ?  ?  ?  ?  ?  ?  ? ?Effie Berkshire ? ? ? ? ?

## 2021-06-19 NOTE — Transfer of Care (Signed)
Immediate Anesthesia Transfer of Care Note ? ?Patient: Martha Mathews ? ?Procedure(s) Performed: TOTAL LAPAROSCOPIC HYSTERECTOMY WITH SALPINGECTOMY (Bilateral: Abdomen) ?CYSTOSCOPY ? ?Patient Location: PACU ? ?Anesthesia Type:General ? ?Level of Consciousness: awake, alert  and oriented ? ?Airway & Oxygen Therapy: Patient Spontanous Breathing ? ?Post-op Assessment: Report given to RN and Post -op Vital signs reviewed and stable ? ?Post vital signs: Reviewed and stable ? ?Last Vitals:  ?Vitals Value Taken Time  ?BP 141/90 06/19/21 1224  ?Temp    ?Pulse 74 06/19/21 1227  ?Resp 13 06/19/21 1227  ?SpO2 100 % 06/19/21 1227  ?Vitals shown include unvalidated device data. ? ?Last Pain:  ?Vitals:  ? 06/19/21 0737  ?TempSrc: Oral  ?PainSc: 0-No pain  ?   ? ?  ? ?Complications: No notable events documented. ?

## 2021-06-19 NOTE — Anesthesia Procedure Notes (Signed)
Procedure Name: Intubation ?Date/Time: 06/19/2021 8:43 AM ?Performed by: Griffin Dakin, CRNA ?Pre-anesthesia Checklist: Patient identified, Emergency Drugs available, Suction available and Patient being monitored ?Patient Re-evaluated:Patient Re-evaluated prior to induction ?Oxygen Delivery Method: Circle system utilized ?Preoxygenation: Pre-oxygenation with 100% oxygen ?Induction Type: IV induction ?Ventilation: Mask ventilation without difficulty and Oral airway inserted - appropriate to patient size ?Laryngoscope Size: Mac and 3 ?Grade View: Grade II ?Tube type: Oral ?Tube size: 7.0 mm ?Number of attempts: 1 ?Airway Equipment and Method: Stylet and Oral airway ?Placement Confirmation: ETT inserted through vocal cords under direct vision, positive ETCO2 and breath sounds checked- equal and bilateral ?Secured at: 23 cm ?Tube secured with: Tape ?Dental Injury: Teeth and Oropharynx as per pre-operative assessment  ? ? ? ? ?

## 2021-06-19 NOTE — H&P (Signed)
MD GYN HISTORY AND PHYSICAL ? ?Admission Date: 06/19/2021  7:10 AM  ?Admit Diagnosis: Abnormal uterine bleeding; dysmenorrhea ?Patient Name: Martha Mathews        ?MRN#: [id number] ? ?Subjective:  ? ? Patient is a 36 y.o. female [id number] presents for scheduled TLH possible TAH / BS.  The indications for procedure are abnormal uterine bleeding and dysmenorrhea. Patient was referred to GYN from PCP at [facility] in [place]. Pt. had a Transvaginal US 10/16/2020 that was limited due to bowel gas, but showed enlarged uterus (results are in the chart). Pt. changes pad about 3 x an hr due to heavy cycles. In the last 2-3 months the bleeding and dysmenorrhea has markedly worsened. Prior to August Midol would help to alleviate her pain, but now it does not help. ?Patient is up to date on her preventative care. ? ? ?Pertinent Gynecological History: ?Menses:flow is excessive with use of 6 pads or tampons on heaviest days and with severe dysmenorrhea  ?Bleeding: dysfunctional uterine bleeding ?Contraception: tubal ligation ?DES exposure: denies ?Blood transfusions: none ?Sexually transmitted diseases: no past history ?Previous GYN Procedures:  none   ?Last mammogram:  N/A  Date: ?Last pap: normal Date: 11/14/20 ?OB History: G2, P2  ? ?Menstrual History: ?Menarche age: 43 ? ? ?Medical / Surgical History: ?Past medical history:  ?Past Medical History:  ?Diagnosis Date  ? Abnormal Pap smear   ? Anemia   ? HISTORY W/2006 PREGNANCY  ? Anxiety   ? Bipolar 1 disorder ([place])   ? Chiari malformation   ? 3RD GRADE, TYPE I STABLE  ? Chlamydia 2004  ? Depression   ? not currently on meds, being treated- trying to find a med  ? Dyslexia   ? Hypertelorism   ? Hypertension 2022  ? Migraines   ? otc med prn w/pregnancy  ? Neisseria gonorrhoeae   ? Aug 2010, treated by Ob  ? Ringworm   ? Urinary tract infection   ?  ?Past surgical history:  ?Past Surgical History:  ?Procedure Laterality Date  ? BILATERAL SALPINGECTOMY Bilateral 04/21/2012   ? Procedure: BILATERAL SALPINGECTOMY;  Surgeon: Cheri Fowler, MD;  Location: [facility];  Service: Obstetrics;  Laterality: Bilateral;  ? CESAREAN SECTION  2006  ? CESAREAN SECTION N/A 04/21/2012  ? Procedure: CESAREAN SECTION;  Surgeon: Cheri Fowler, MD;  Location: [facility];  Service: Obstetrics;  Laterality: N/A;  Repeat  ? CHOLECYSTECTOMY  2006  ? EYE SURGERY    ? left eye surgery - lazy eye  ? INDUCED ABORTION    ? vaccum on oct 2009  ? MYRINGOTOMY    ? as a child  ? TONSILLECTOMY    ? as a child  ? TUBAL LIGATION  2014  ? pt believes a portion of fallopian tube(s) were removed  ? ?Family History:  ?Family History  ?Problem Relation Age of Onset  ? Cancer Maternal Grandmother   ?     Breast cancer  ? Cancer Paternal Grandmother   ?     Breast cancer  ? Stroke Paternal Grandmother 88  ? Diabetes Cousin   ? Hypertension Cousin   ? Hypertension Father   ? Depression Father   ? Other Father   ?     radiation for cysts on brain  ? Diabetes Mother   ? Depression Mother   ?  ?Social History:  reports that she has never smoked. She has never used smokeless tobacco. She reports that she does not currently use alcohol.  She reports that she does not currently use drugs. ? ?Allergies: ?Allergies  ?Allergen Reactions  ? Adhesive [Tape] Hives and Rash  ?  redness  ? Hydrocodone-Acetaminophen Itching and Rash  ? Iodine Itching and Rash  ? ?  ?Current Medications at time of admission:  ?Prior to Admission medications   ?Medication Sig Start Date End Date Taking? Authorizing Provider  ?acetaminophen (TYLENOL) 500 MG tablet Take 1,000 mg by mouth every 6 (six) hours as needed for headache.   Yes [provider]  ?ALPRAZolam Duanne Moron) 1 MG tablet Take 1 mg by mouth 2 (two) times daily as needed for anxiety. 01/09/21  Yes [provider]  ?amoxicillin-clavulanate (AUGMENTIN) 875-125 MG tablet Take 1 tablet by mouth every 12 (twelve) hours. 05/20/21  Yes Oswaldo Conroy E, FNP  ?aspirin EC 81 MG tablet Take 81 mg by mouth  daily. Swallow whole.   Yes [provider]  ?FLUoxetine (PROZAC) 40 MG capsule Take 40 mg by mouth daily.  06/26/16  Yes [provider]  ?gabapentin (NEURONTIN) 100 MG capsule Take 200 mg by mouth at bedtime. 09/17/19  Yes [provider]  ?linaclotide (LINZESS) 145 MCG CAPS capsule Take 145 mcg by mouth 2 (two) times a week.   Yes [provider]  ?lisinopril (ZESTRIL) 20 MG tablet Take 20 mg by mouth daily.   Yes [provider]  ?QUEtiapine (SEROQUEL) 300 MG tablet Take 300 mg by mouth at bedtime. 10/31/20  Yes [provider]  ?topiramate (TOPAMAX) 50 MG tablet Take 50 mg by mouth daily.   Yes [provider]  ?Vitamin D, Ergocalciferol, (DRISDOL) 1.25 MG (50000 UNIT) CAPS capsule Take 50,000 Units by mouth every Wednesday. 08/23/19  Yes [provider]  ? ? ?Review of Systems: ?Constitutional: Negative   ?HENT: Negative   ?Eyes: Negative   ?Respiratory: Negative   ?Cardiovascular: Negative   ?Gastrointestinal: Negative  ?Genitourinary: Pos for vaginal bleeding   ?Musculoskeletal: Negative   ?Skin: Negative   ?Neurological: Negative   ?Endo/Heme/Allergies: Negative   ?Psychiatric/Behavioral: Negative  ?  ?  ?Objective:  ? ?  ?Physical Exam: ?VS: Blood pressure (!) 163/112, pulse 98, temperature 98.4 ?F (36.9 ?C), temperature source Oral, resp. rate 14, height '5\' 6"'$  (1.676 m), weight 134.3 kg, SpO2 99 %. ?Physical Exam ?General:   alert, cooperative, and no distress  ?Skin:   normal  ?Lungs:   clear to auscultation bilaterally  ?Heart:   regular rate and rhythm, S1, S2 normal, no murmur, click, rub or gallop  ?Abdomen:  soft, non-tender; bowel sounds normal; no masses,  no organomegaly  ?Pelvis:  Exam deferred.  ?Uterus (office): midline, mobile, non-tender, normal shape, no uterine prolapse, and enlarged 12 wks  ?Adnexa: No palpable masses  ?Cervix:  Normal  ? ?Labs / Imaging: ?Results for orders placed or performed during the hospital  encounter of 06/19/21 (from the past 24 hour(s))  ?CBC     Status: Abnormal  ? Collection Time: 06/19/21  7:18 AM  ?Result Value Ref Range  ? WBC 9.8 4.0 - 10.5 K/uL  ? RBC 4.97 3.87 - 5.11 MIL/uL  ? Hemoglobin 12.8 12.0 - 15.0 g/dL  ? HCT 41.2 36.0 - 46.0 %  ? MCV 82.9 80.0 - 100.0 fL  ? MCH 25.8 (L) 26.0 - 34.0 pg  ? MCHC 31.1 30.0 - 36.0 g/dL  ? RDW 14.6 11.5 - 15.5 %  ? Platelets 336 150 - 400 K/uL  ? nRBC 0.0 0.0 - 0.2 %  ?  Type and screen     Status: None (Preliminary result)  ? Collection Time: 06/19/21  7:30 AM  ?Result Value Ref Range  ? ABO/RH(D) PENDING   ? Antibody Screen PENDING   ? Sample Expiration    ?  06/22/2021,2359 ?Performed at [facility], [place] [address], [place], [place] [address] ?  ?Pregnancy, urine POC     Status: None  ? Collection Time: 06/19/21  7:45 AM  ?Result Value Ref Range  ? Preg Test, Ur NEGATIVE NEGATIVE  ? ?[place] office November 2022: ?Transabdominal / Transvaginal US Performed ?Anteverted Uterus, Measures 11.0 cm Fundus to External OS. ?Endometrium Appears Within Normal Limits-Normal Morphology On 3D Rendering ?RT OV Appears Within Normal Limits ?LT OV Appears Within Normal Limits ?No Free Fluid Noted in [place] ?Bilateral Adnexas Appears Unremarkable ?Bladder Appears Unremarkable ?  ? ?Assessment:  ?  ?    Patient is a 36 y.o. y.o [id number] with diagnosis of  Abnormal uterine bleeding and dysmenorrhea      ?  ?Plan:  ? ? I had a lengthy discussion with the patient regarding her diagnosis. She was counseled about the procedure, risks, reasons, benefits and complications to include: injury to bowel, bladder, major blood vessel, ureter, bleeding, possibility of transfusion, infection, abnormal scar formation and the possibility of diagnosis of malignancy requiring further medical or surgical treatment. All the above was reviewed in detail.  ?All inquiries made by patient were answered.  ?Consent was signed, witnessed and placed into chart. Post op Instructions were reviewed,  including office follow up.  ? ?On call to OR for TLH possible TAH bilateral salpingectomy, possible cystoscopy.  ? ? ?Sanjuana Kava MD ?06/19/2021, 8:04 AM  ?

## 2021-06-19 NOTE — Progress Notes (Signed)
Patient arrived to [place] room 1 alert and oriented x4. Pain level 6/10. 4 lapsites with skin glue, honeycomb dressing over bellybutton all clean dry and intact. Bed in lowest position call light in reach. Will continue to monitor patient.  ?

## 2021-06-20 ENCOUNTER — Encounter (HOSPITAL_COMMUNITY): Payer: Self-pay | Admitting: Obstetrics & Gynecology

## 2021-06-20 ENCOUNTER — Other Ambulatory Visit (HOSPITAL_COMMUNITY): Payer: Self-pay

## 2021-06-20 DIAGNOSIS — I10 Essential (primary) hypertension: Secondary | ICD-10-CM | POA: Diagnosis present

## 2021-06-20 DIAGNOSIS — N736 Female pelvic peritoneal adhesions (postinfective): Secondary | ICD-10-CM | POA: Diagnosis present

## 2021-06-20 DIAGNOSIS — Z885 Allergy status to narcotic agent status: Secondary | ICD-10-CM | POA: Diagnosis not present

## 2021-06-20 DIAGNOSIS — N938 Other specified abnormal uterine and vaginal bleeding: Secondary | ICD-10-CM | POA: Diagnosis present

## 2021-06-20 DIAGNOSIS — D251 Intramural leiomyoma of uterus: Secondary | ICD-10-CM | POA: Diagnosis present

## 2021-06-20 DIAGNOSIS — F319 Bipolar disorder, unspecified: Secondary | ICD-10-CM | POA: Diagnosis present

## 2021-06-20 DIAGNOSIS — Z888 Allergy status to other drugs, medicaments and biological substances status: Secondary | ICD-10-CM | POA: Diagnosis not present

## 2021-06-20 DIAGNOSIS — Z6841 Body Mass Index (BMI) 40.0 and over, adult: Secondary | ICD-10-CM | POA: Diagnosis not present

## 2021-06-20 DIAGNOSIS — Z8249 Family history of ischemic heart disease and other diseases of the circulatory system: Secondary | ICD-10-CM | POA: Diagnosis not present

## 2021-06-20 DIAGNOSIS — G8918 Other acute postprocedural pain: Secondary | ICD-10-CM | POA: Diagnosis present

## 2021-06-20 LAB — CBC
HCT: 34 % — ABNORMAL LOW (ref 36.0–46.0)
Hemoglobin: 10.9 g/dL — ABNORMAL LOW (ref 12.0–15.0)
MCH: 26.5 pg (ref 26.0–34.0)
MCHC: 32.1 g/dL (ref 30.0–36.0)
MCV: 82.5 fL (ref 80.0–100.0)
Platelets: 295 10*3/uL (ref 150–400)
RBC: 4.12 MIL/uL (ref 3.87–5.11)
RDW: 14.7 % (ref 11.5–15.5)
WBC: 16.7 10*3/uL — ABNORMAL HIGH (ref 4.0–10.5)
nRBC: 0 % (ref 0.0–0.2)

## 2021-06-20 MED ORDER — OXYCODONE-ACETAMINOPHEN 5-325 MG PO TABS
1.0000 | ORAL_TABLET | ORAL | 0 refills | Status: AC | PRN
  Filled 2021-06-20: qty 30, 3d supply, fill #0

## 2021-06-20 MED ORDER — PANTOPRAZOLE SODIUM 40 MG PO TBEC
40.0000 mg | DELAYED_RELEASE_TABLET | Freq: Every day | ORAL | Status: DC
  Administered 2021-06-20 – 2021-06-21 (×2): 40 mg via ORAL
  Filled 2021-06-20 (×2): qty 1

## 2021-06-20 MED ORDER — IBUPROFEN 800 MG PO TABS
800.0000 mg | ORAL_TABLET | Freq: Three times a day (TID) | ORAL | Status: DC
  Administered 2021-06-20 – 2021-06-21 (×3): 800 mg via ORAL
  Filled 2021-06-20 (×3): qty 1

## 2021-06-20 MED ORDER — IBUPROFEN 800 MG PO TABS
800.0000 mg | ORAL_TABLET | Freq: Three times a day (TID) | ORAL | 3 refills | Status: AC
Start: 2021-06-20 — End: ?
  Filled 2021-06-20: qty 60, 20d supply, fill #0

## 2021-06-20 NOTE — Progress Notes (Signed)
Mobility Specialist Progress Note: ? ? 06/20/21 1400  ?Mobility  ?Activity Ambulated with assistance in hallway  ?Level of Assistance Standby assist, set-up cues, supervision of patient - no hands on  ?Assistive Device Front wheel walker  ?Distance Ambulated (ft) 570 ft  ?Activity Response Tolerated well  ?$Mobility charge 1 Mobility  ? ?Pt received in bed asking to ambulate. Complaints of gas pain. Required multiple short standing breaks. Left in bed with call bell in reach and all needs met.  ? ?Cyndia Degraff ?Mobility Specialist ?Primary Phone [phone] ? ?

## 2021-06-20 NOTE — Op Note (Signed)
OPERATIVE NOTE ? ?Martha Mathews  ? ?DOB:    June 14, 1985  ? ?MRN:    [id number]  ? ?Date of Surgery:  06/19/2021 ? ?Preoperative Diagnosis: ?Abnormal uterine bleeding ?Dysmenorrhea ? ?Postoperative Diagnosis: ?Same plus pelvic adhesive disease ? ?Procedure (s): ?Total Laparoscopic hysterectomy  ?Bilateral salpingectomy ?Lysis of Adhesions ?Urethrocystoscopy ? ?Surgeon: ?Mady Haagensen. Alwyn Pea, M.D. ? ?Assistant: ?Everett Graff, M.D. ? ?Anesthetic: ?General  ETA ? ?EBL: 175 cc ? ?IVF:  1200 mL LR 200 mL albumin ? ?UOP:  500 mL Clear urine ( Foley) ? ?Specimen:  Uterus, cervix, bilateral tubes ? ?Complications:  None ? ? ?Disposition: ?The patient presents with the above-mentioned diagnosis. She understands the indications for surgical procedure.  She also understands the alternative treatment options. She accepts the risk of, but not limited to, anesthetic complications, bleeding, infections, and possible damage to the surrounding organs. ?  ?Indication: ?36 yo with long standing history of abnormal uterine bleeding and dysmenorrhea refractory to conservation medical management.  ?  ?Findings: ?Exam under anesthesia: External vulva: Normal  vaginal vault: normal ruggae Cervix: Grossly normal Uterus: enlarged 12 wks size, mobile no palpable masses Adnexa: no palpable masses ?Laparoscopy: Omental adhesions anterior abdominal wall.  Dense pelvic adhesions from left pelvic side wall to lower uterine segment.Enlarged 13 week size globular appearing uterus with small fibroids visible.  Normal bilateral ovaries.  Normal appearing fallopian tubes bilaterally.  Appendix not visualized. Gallbladder absent. Normal liver edge; cystoscopy: normal urothelium brisk bilateral jets seen from both ureteral orifices ?  ?Procedure: ?The patient was brought into the operating room with an intravenous line in placed, and anesthetic was administered. She was placed in a low dorsal lithotomy position using Allen stirrups. The patient's  abdomen was  prepped with ChloraPrep. The perineum and vagina were prepped with multiple layers of Betadine. A  weighted speculum was placed in the posterior vagina and the anterior vagina was retracted with a Deaver retractor. The anterior lip of the cervix was held with a single tooth tenaculum. The uterus was sounded to 9cm  and dilated with [facility] dilators. The tenaculum was removed and the anterior lip of the cervix held with a stitch of 0-vicryl. 2.5 cm Advincula uterine manipulator was placed and the intrauterine balloon inflated. The  Speculum was removed and the bladder catheterized with a foley.  The patient was sterilely draped. The subumbilical area was injected with half percent Marcaine.  An incision was made in the umbilicus with 11 blade scalpel after infiltration with 0.25% Marcaine. A 10 mm Visiport trocar was used with l47m 0 degree laparoscope attached to perform direct entry into the patient's abdomen. Direct video visualization confirmed entry into the abdomen and pneumoperitoneum was obtained using approximately 3L CO2 gas. The patient was placed in steep Trendelenburg position. A small incision was made and a 5 mm trocar was inserted into the abdominal cavity along the right pelvis under direct visualization.  The 854mAir seal trocar was placed along the lower left abdomen and another left 3m21mid abdominal trocar was placed. There was no injury noted with placement of trocars. The pelvic contents were visualized and findings noted above, both ureters were identified crossing the pelvic brim and pelvic sidewall coursing well below operative field.  Pictures were taken of the patient's pelvic structures.   ?  ?The omental adhesions were obstructing views of the pelvis, therefore they were cauterized and transected with the 3mm59mgasure. Visualization improved. The  left fallopian tube was identified and followed to its  fimbriated end. The proximal portion of the left fallopian tube was cauterized and cut.  The left round ligament was cauterized and transected. The left utero-ovarian ligament was cauterized and cut. The bladder flap was developed by entering the anterior broad ligament.  The right fallopian tube was identified and followed to its fimbriated end. The proximal portion of the right fallopian tube was cauterized and cut. The right upper pedicle was then isolated. The right round ligament was then cauterized and transected via the Ligasure. The anterior broad ligament was then isolated and the the mesovarium was skeletonized. A bladder flap was started by the endoshears attached to monopolar cautery and the peritoneum on the vesicouterine fold was incised to mobilize the bladder.The right utero-ovarian ligament was cauterized and cut. The bladder flap was developed further by dissecting down carefully the adhesions along the uterovesical junction and along the lower uterine segment. Care was taken to remain close to the uterus to avoid the ureters and the bladder was pushed well below the operative field. Once the [facility] colpotomy ring was skeletonized and in position, the uterine arteries were sealed and transected using the 53m Ligasure at the level of the colpotomy ring.  The uterine arteries were coagulated and transected bilaterally. ?  ?The vagina was transected using the monopolar endoshears resulting in separation of the uterus and attached tubes. The uterus, cervix and tubes were then delivered through the vagina. The vaginal occluder was insufflated with 60 cc of water and the vaginal vault was laparoscopically with 0-v-loc suture.  ?A modified colposuspension was performed by incorporating the uterosacral ligaments at the angles of the vaginal cuff.  The surgical technician confirmed that the vault was closed by placing a finger in the vagina and there were no notable defects. Irrigation was performed and the vaginal cuff and the pedicles were noted to be hemostatic.    ?  ?A cystoscopy was then  performed using a 70 degree cystoscopy and a complete bladder survey was performed. There was no noted injury to the bladder urothelium and there were brisk jets from both ureteral orifices.  ?  ?The accessory ports were removed under direct laparoscopic guidance and  the pneumoperitoneum was released via the Airseal mechanism. The umbilical port was removed using laparoscopic guidance. The umbilical fascia was closed with an interrupted figure-of-eight stitch using 0 Vicryl on a UR-6. The skin was closed with subcuticular stitches using 4-0 Monocryl suture. Dermabond was used to cover each incision site and op sites placed.  The final sponge, needle, and instrument counts were correct at the completion of the procedure. The patient tolerated the procedure well and was awakened and taken to the post anesthesia care unit in stable condition.    ?  ?  ?  ?PCaffie DammeMD ? ?

## 2021-06-20 NOTE — Discharge Summary (Signed)
Physician Discharge Summary  ?Patient ID: ?Martha Mathews ?MRN: [id number] ?DOB/AGE: 10/06/1985 36 y.o. ? ?Admit date: 06/19/2021 ?Discharge date: 06/20/2021 ? ? ?Discharge Diagnoses:  ?Principal Problem: ?  Dysmenorrhea ?Active Problems: ?  S/P laparoscopic hysterectomy ? ? ?Operation: Total Laparoscopic Hysterectomy; Bilateral Salpingectomy; Lysis of Adhesions; Urethrocystoscopy ? ? ?Discharged Condition: good ? ?Hospital Course: Patient taken to operating room where above procedures were performed.  There were no intraoperative or post operative complications.  Patient progressed well in the hospital.  Due to gas pain patient desired to stay another night in hospital.  POD#2 was meeting all discharge criteria and was discharged home.   ? ?Disposition: Home ? ?Discharge Medications:  ?Allergies as of 06/20/2021   ? ?   Reactions  ? Adhesive [tape] Hives, Rash  ? redness  ? Hydrocodone-acetaminophen Itching, Rash  ? Iodine Itching, Rash  ? ?  ? ?  ?Medication List  ?  ? ?TAKE these medications   ? ?acetaminophen 500 MG tablet ?Commonly known as: TYLENOL ?Take 1,000 mg by mouth every 6 (six) hours as needed for headache. ?  ?ALPRAZolam 1 MG tablet ?Commonly known as: Duanne Moron ?Take 1 mg by mouth 2 (two) times daily as needed for anxiety. ?  ?amoxicillin-clavulanate 875-125 MG tablet ?Commonly known as: AUGMENTIN ?Take 1 tablet by mouth every 12 (twelve) hours. ?  ?aspirin EC 81 MG tablet ?Take 81 mg by mouth daily. Swallow whole. ?  ?FLUoxetine 40 MG capsule ?Commonly known as: PROZAC ?Take 40 mg by mouth daily. ?  ?gabapentin 100 MG capsule ?Commonly known as: NEURONTIN ?Take 200 mg by mouth at bedtime. ?  ?ibuprofen 800 MG tablet ?Commonly known as: ADVIL ?Take 1 tablet (800 mg total) by mouth every 8 (eight) hours. ?  ?linaclotide 145 MCG Caps capsule ?Commonly known as: LINZESS ?Take 145 mcg by mouth 2 (two) times a week. ?  ?lisinopril 20 MG tablet ?Commonly known as: ZESTRIL ?Take 20 mg by mouth daily. ?   ?oxyCODONE-acetaminophen 5-325 MG tablet ?Commonly known as: PERCOCET/ROXICET ?Take 1-2 tablets by mouth every 4 (four) hours as needed for severe pain. ?  ?QUEtiapine 300 MG tablet ?Commonly known as: SEROQUEL ?Take 300 mg by mouth at bedtime. ?  ?topiramate 50 MG tablet ?Commonly known as: TOPAMAX ?Take 50 mg by mouth daily. ?  ?Vitamin D (Ergocalciferol) 1.25 MG (50000 UNIT) Caps capsule ?Commonly known as: DRISDOL ?Take 50,000 Units by mouth every Wednesday. ?  ? ?  ? ?  ?  ? ? ?  ?Discharge Care Instructions  ?(From admission, onward)  ?  ? ? ?  ? ?  Start     Ordered  ? 06/20/21 0000  Discharge wound care:       ?Comments: Your incisions have skin glue which will dissolve over time.  You may shower, no baths. Don't use lotions or savs on incision sites.  ? 06/20/21 1043  ? ?  ?  ? ?  ?  ? ?Discharge Instructions: Call Dr. Alwyn Pea or call office immediately if you are having very heavy vaginal bleeding (i.e. soaking a pad an hour) or passing very large clots frequently.  ?Look vaginal infection to include abnormal appearing discharge with odor or pus  ? ?Follow-up: 1 to 2 weeks for incision check followed by 4 week post operative visit ? ?Final Pathology: Pending  ? ? ?Signed: Rogelio Seen [email or web address]@ ?06/20/2021, 10:44 AM ?  ?

## 2021-06-20 NOTE — Progress Notes (Signed)
MD POST OP PROGRESS NOTE ? ?Martha Mathews is a36 y.o.  ?[id number] ? ?Post Op Date # 1 ? ?Subjective: ?Patient complains of bad gas pain.  She has not passed flatus ?She denies chest pain, shortness of breath, no nausea or vomiting.  ?She reports ambulating overnight without difficulty. ?The foley is in place. ?She is tolerating PO, no nausea or vomiting.  ? ?Objective: ?Vital signs in last 24 hours: ?Temp:  [97.8 ?F (36.6 ?C)-99.2 ?F (37.3 ?C)] 97.8 ?F (36.6 ?C) (05/04 0815) ?Pulse Rate:  [69-97] 92 (05/04 0815) ?Resp:  [9-18] 18 (05/04 0815) ?BP: (113-192)/(61-115) 123/72 (05/04 9191) ?SpO2:  [95 %-100 %] 96 % (05/04 0815) ? ?Intake/Output from previous day: ?05/03 0701 - 05/04 0700 ?In: 2743.6 [P.O.:240; I.V.:2253.6] ?Out: 1525 [Urine:1350] ?Intake/Output this shift: ?No intake/output data recorded. ?Recent Labs  ?Lab 06/19/21 ?0718 06/19/21 ?1550 06/20/21 ?6606  ?WBC 9.8 16.4* 16.7*  ?HGB 12.8 12.1 10.9*  ?HCT 41.2 38.3 34.0*  ?PLT 336 291 295  ? ?  ?Recent Labs  ?Lab 06/19/21 ?1550  ?CREATININE 0.98  ?  ?EXAM: ?General: alert, cooperative, and moderate distress ?GI: Soft, appropriate tenderness to palpation, no rebound or guarding ?      Incisions clean dry intact ?Extremities: SCDs in place and running  ?Vaginal Bleeding: none ? ? ?Assessment: ?s/p Procedure(s): ?TOTAL LAPAROSCOPIC HYSTERECTOMY WITH SALPINGECTOMY ?CYSTOSCOPY:  With uncontrolled post op pain.  ?Overnight there was one severe range BP - IV labetalol x 1 dose administered ? ?Plan: ?Encouraged patient to ambulate ?Drink warm / hot fluids to help with GI motility ?Heating pad to abdomen ?D/C IV and foley ?Will change Ibuprofen to 800 mg q 8 hours ?Will d/c IV meds and transition all to PO ? ? LOS: 0 days  ? ? ?Iley Breeden,MD 06/20/2021 10:20 AM  ?

## 2021-06-21 ENCOUNTER — Other Ambulatory Visit (HOSPITAL_COMMUNITY): Payer: Self-pay

## 2021-06-21 LAB — SURGICAL PATHOLOGY

## 2021-06-21 NOTE — Progress Notes (Signed)
2 Days Post-Op Procedure(s): ?TOTAL LAPAROSCOPIC HYSTERECTOMY WITH SALPINGECTOMY ?CYSTOSCOPY ? ?Subjective: ?Patient reports tolerating PO, + flatus, and + BM.   ? ?Objective: ?BP 118/63 (BP Location: Left Arm)   Pulse 75   Temp 97.7 ?F (36.5 ?C) (Oral)   Resp 18   Ht '5\' 6"'$  (1.676 m)   Wt 134.3 kg   SpO2 100%   BMI 47.78 kg/m?  ? ?General: alert and cooperative ?Resp: clear to auscultation bilaterally ?GI: soft, non-tender; bowel sounds normal; no masses,  no organomegaly ?Extremities: Homans sign is negative, no sign of DVT ?Vaginal Bleeding: none ? ?Assessment: ?s/p Procedure(s): ?TOTAL LAPAROSCOPIC HYSTERECTOMY WITH SALPINGECTOMY ?CYSTOSCOPY: stable and tolerating diet ? ?Plan: ?Discharge home ?DC summary per DR PInn ? LOS: 1 day  ? ? ?Martha Mathews ?06/21/2021, 3:34 PM ? ? ? ?  ?

## 2021-06-21 NOTE — Progress Notes (Signed)
Mobility Specialist Progress Note: ? ? 06/21/21 1019  ?Mobility  ?Activity Ambulated with assistance in hallway  ?Level of Assistance Independent after set-up  ?Assistive Device None  ?Distance Ambulated (ft) 300 ft  ?Activity Response Tolerated well  ?$Mobility charge 1 Mobility  ? ?Pt received coming out of the BR willing to participate in mobility. Complaints of 8/10 stinging pain at her belly button and 8/10 gas pain in her shoulder. Left in bed with call bell in reach and all needs met.  ? ?Orell Hurtado ?Mobility Specialist ?Primary Phone [phone] ? ?

## 2021-06-21 NOTE — Progress Notes (Signed)
Bedelia Person to be D/C'd per MD order. Discussed with the patient and all questions fully answered. ?? ?VSS, Skin clean, dry and intact without evidence of skin break down, no evidence of skin tears noted. ?? ?IV catheter discontinued intact. Site without signs and symptoms of complications. Dressing and pressure applied. ?? ?An After Visit Summary was printed and given to the patient. Patient informed where to pickup prescriptions. ?? ?D/c education completed with patient/family including follow up instructions, medication list, d/c activities limitations if indicated, with other d/c instructions as indicated by MD - patient able to verbalize understanding, all questions fully answered.  ?? ?Patient instructed to return to ED, call 911, or call MD for any changes in condition.  ?? ?Patient to be escorted via [place], and D/C home via private auto.  ?

## 2021-10-17 ENCOUNTER — Emergency Department (HOSPITAL_COMMUNITY)
Admission: EM | Admit: 2021-10-17 | Discharge: 2021-10-18 | Disposition: A | Discharge status: Home / Self Care | Payer: Medicaid Other | Attending: Emergency Medicine | Admitting: Emergency Medicine

## 2021-10-17 ENCOUNTER — Emergency Department (HOSPITAL_COMMUNITY): Payer: Medicaid Other

## 2021-10-17 ENCOUNTER — Encounter (HOSPITAL_COMMUNITY): Payer: Self-pay | Admitting: Emergency Medicine

## 2021-10-17 ENCOUNTER — Other Ambulatory Visit: Payer: Self-pay

## 2021-10-17 DIAGNOSIS — Z7982 Long term (current) use of aspirin: Secondary | ICD-10-CM | POA: Insufficient documentation

## 2021-10-17 DIAGNOSIS — I10 Essential (primary) hypertension: Secondary | ICD-10-CM | POA: Diagnosis not present

## 2021-10-17 DIAGNOSIS — J181 Lobar pneumonia, unspecified organism: Secondary | ICD-10-CM | POA: Diagnosis not present

## 2021-10-17 DIAGNOSIS — R079 Chest pain, unspecified: Secondary | ICD-10-CM | POA: Diagnosis present

## 2021-10-17 DIAGNOSIS — J189 Pneumonia, unspecified organism: Secondary | ICD-10-CM

## 2021-10-17 DIAGNOSIS — Z79899 Other long term (current) drug therapy: Secondary | ICD-10-CM | POA: Diagnosis not present

## 2021-10-17 LAB — CBC
HCT: 40.8 % (ref 36.0–46.0)
Hemoglobin: 13 g/dL (ref 12.0–15.0)
MCH: 26.2 pg (ref 26.0–34.0)
MCHC: 31.9 g/dL (ref 30.0–36.0)
MCV: 82.1 fL (ref 80.0–100.0)
Platelets: 329 10*3/uL (ref 150–400)
RBC: 4.97 MIL/uL (ref 3.87–5.11)
RDW: 14.9 % (ref 11.5–15.5)
WBC: 9 10*3/uL (ref 4.0–10.5)
nRBC: 0 % (ref 0.0–0.2)

## 2021-10-17 LAB — I-STAT BETA HCG BLOOD, ED (MC, WL, AP ONLY): I-stat hCG, quantitative: <5 m[IU]/mL (ref <5)

## 2021-10-17 MED ORDER — LISINOPRIL 20 MG PO TABS
20.0000 mg | ORAL_TABLET | Freq: Once | ORAL | Status: AC
Start: 2021-10-17 — End: 2021-10-18
  Administered 2021-10-18: 20 mg via ORAL
  Filled 2021-10-17: qty 1

## 2021-10-17 NOTE — ED Provider Notes (Signed)
[facility] EMERGENCY DEPARTMENT Provider Note   CSN: [id number] Arrival date & time: 10/17/21  2119     History  Chief Complaint  Patient presents with   Chest Pain    Martha Mathews is a 36 y.o. female.  The history is provided by the patient and medical records.  Chest Pain  36 y.o. F with hx of chiari I malformation, headaches, HTN, depression, bipolar disorder, presenting to the ED with chest pain.  Started having cough Tuesday, seen at [facility] and diagnosed with URI and started on z-pack.  She has been taking as directed without change.  States tonight while cooking dinner she started having increased chest pain, felt like a sharp/stabbing sensation in her chest with some increased SOB.  Her mother gave her a SL NTG and given ASA by EMS with improvement.  She denies fever/chills/sweats.  No prior cardiac hx.  She was tested for covid at [facility] on Tuesday (rapid and PCR) and both of which were negative.  No sick contacts.  No abdominal pain, nausea, vomiting, or diarrhea.  Home Medications Prior to Admission medications   Medication Sig Start Date End Date Taking? Authorizing Provider  acetaminophen (TYLENOL) 500 MG tablet Take 1,000 mg by mouth every 6 (six) hours as needed for headache.    [provider]  ALPRAZolam Duanne Moron) 1 MG tablet Take 1 mg by mouth 2 (two) times daily as needed for anxiety. 01/09/21   [provider]  amoxicillin-clavulanate (AUGMENTIN) 875-125 MG tablet Take 1 tablet by mouth every 12 (twelve) hours. 05/20/21   Teodora Medici, FNP  aspirin EC 81 MG tablet Take 81 mg by mouth daily. Swallow whole.    [provider]  FLUoxetine (PROZAC) 40 MG capsule Take 40 mg by mouth daily.  06/26/16   [provider]  gabapentin (NEURONTIN) 100 MG capsule Take 200 mg by mouth at bedtime. 09/17/19   [provider]  ibuprofen (ADVIL) 800 MG tablet Take 1 tablet (800 mg total) by mouth every 8 (eight) hours. 06/20/21    Sanjuana Kava, MD  linaclotide (LINZESS) 145 MCG CAPS capsule Take 145 mcg by mouth 2 (two) times a week.    [provider]  lisinopril (ZESTRIL) 20 MG tablet Take 20 mg by mouth daily.    [provider]  oxyCODONE-acetaminophen (PERCOCET/ROXICET) 5-325 MG tablet Take 1-2 tablets by mouth every 4 (four) hours as needed for severe pain. 06/20/21   Sanjuana Kava, MD  QUEtiapine (SEROQUEL) 300 MG tablet Take 300 mg by mouth at bedtime. 10/31/20   [provider]  topiramate (TOPAMAX) 50 MG tablet Take 50 mg by mouth daily.    [provider]  Vitamin D, Ergocalciferol, (DRISDOL) 1.25 MG (50000 UNIT) CAPS capsule Take 50,000 Units by mouth every Wednesday. 08/23/19   [provider]      Allergies    Adhesive [tape], Hydrocodone-acetaminophen, and Iodine    Review of Systems   Review of Systems  Cardiovascular:  Positive for chest pain.  All other systems reviewed and are negative.   Physical Exam Updated Vital Signs BP (!) 164/103   Pulse 65   Temp 98.9 F (37.2 C) (Oral)   Resp 18   LMP 04/21/2021   SpO2 97%   Physical Exam Vitals and nursing note reviewed.  Constitutional:      Appearance: She is well-developed. She is obese.  HENT:     Head: Normocephalic and atraumatic.  Eyes:  Conjunctiva/sclera: Conjunctivae normal.     Pupils: Pupils are equal, round, and reactive to light.  Cardiovascular:     Rate and Rhythm: Normal rate and regular rhythm.     Heart sounds: Normal heart sounds.  Pulmonary:     Effort: Pulmonary effort is normal.     Breath sounds: Normal breath sounds. No wheezing or rhonchi.     Comments: Dry cough observed on exam, no distress, able to speak in sentences without difficulty Abdominal:     General: Bowel sounds are normal.     Palpations: Abdomen is soft.  Musculoskeletal:        General: Normal range of motion.     Cervical back: Normal range of motion.  Skin:    General: Skin is warm and dry.   Neurological:     Mental Status: She is alert and oriented to person, place, and time.     ED Results / Procedures / Treatments   Labs (all labs ordered are listed, but only abnormal results are displayed) Labs Reviewed  BASIC METABOLIC PANEL - Abnormal; Notable for the following components:      Result Value   Potassium 3.3 (*)    Calcium 8.7 (*)    All other components within normal limits  CBC  I-STAT BETA HCG BLOOD, ED ([facility], [facility], [facility] ONLY)  TROPONIN I (HIGH SENSITIVITY)  TROPONIN I (HIGH SENSITIVITY)    EKG EKG Interpretation  Date/Time:  Thursday October 17 2021 21:20:06 EDT Ventricular Rate:  80 PR Interval:  152 QRS Duration: 94 QT Interval:  398 QTC Calculation: 459 R Axis:   -11 Text Interpretation: Normal sinus rhythm Minimal voltage criteria for LVH, may be normal variant ( Cornell product ) Borderline ECG When compared with ECG of 20-May-2021 15:50, PREVIOUS ECG IS PRESENT No acute changes Confirmed by Addison Lank [phone]) on 10/18/2021 12:10:36 AM  Radiology DG Chest 2 View  Result Date: 10/17/2021 CLINICAL DATA:  Chest pain for 2 hours EXAM: CHEST - 2 VIEW COMPARISON:  01/09/2020 FINDINGS: Cardiac shadow is within normal limits. Lungs are well aerated bilaterally. Patchy density is noted in the lobe may represent some very early infiltrate. This is new prior exam. No bony abnormality is noted. IMPRESSION: Patchy airspace opacity in the right upper lobe. Electronically Signed   By: Inez Catalina M.D.   On: 10/17/2021 22:22    Procedures Procedures    Medications Ordered in ED Medications  lisinopril (ZESTRIL) tablet 20 mg (20 mg Oral Given 10/18/21 0008)    ED Course/ Medical Decision Making/ A&P                           Medical Decision Making Risk Prescription drug management.   36 year old female presenting to the ED with chest pain.  URI symptoms since Tuesday, currently on Z-Pak prescribed by urgent care.  States chest pain began tonight while  cooking.  No known cardiac history.  She is afebrile, nontoxic.  Does have dry cough on exam.  She is somewhat hypertensive--has not yet had her nighttime dose of lisinopril.  This was given.  EKG is nonischemic.  Chest x-ray with patchy opacity in right upper lobe.  Labs are reassuring--specifically no leukocytosis or electrolyte derangement.  Her troponin is negative.  Given nature of her symptoms, low suspicion for ACS.  Suspect her pain is likely due to her pneumonia.  BP is downtrending after additional dose of lisinopril.  She is stable for  discharge.  She has had negative covid test x2 in th epast 48 hours, do not feel this needs to be repeated.  Instructed to continue her Z-Pak (has only had 2 doses thus far).  She will follow-up with PCP.  Work note provided.  Return here for new concerns.  Final Clinical Impression(s) / ED Diagnoses Final diagnoses:  Community acquired pneumonia of right upper lobe of lung  Chest pain in adult    Rx / DC Orders ED Discharge Orders     None         Larene Pickett, PA-C 10/18/21 0054    Fatima Blank, MD 10/18/21 [phone]

## 2021-10-17 NOTE — ED Triage Notes (Signed)
Patient reports central chest pain this evening , non radiating with mild SOB and nausea . She received ASA 324 mg and Zofran '4mg'$  IV by EMS prior to arrival . 1 NTG sl at home . Currently taking oral antibiotic for URI prescribed at an urgent care .

## 2021-10-18 LAB — BASIC METABOLIC PANEL
Anion gap: 10 (ref 5–15)
BUN: 7 mg/dL (ref 6–20)
CO2: 24 mmol/L (ref 22–32)
Calcium: 8.7 mg/dL — ABNORMAL LOW (ref 8.9–10.3)
Chloride: 104 mmol/L (ref 98–111)
Creatinine, Ser: 0.84 mg/dL (ref 0.44–1.00)
GFR, Estimated: >60 mL/min (ref >60)
Glucose, Bld: 84 mg/dL (ref 70–99)
Potassium: 3.3 mmol/L — ABNORMAL LOW (ref 3.5–5.1)
Sodium: 138 mmol/L (ref 135–145)

## 2021-10-18 LAB — TROPONIN I (HIGH SENSITIVITY)
Troponin I (High Sensitivity): 4 ng/L (ref <18)
Troponin I (High Sensitivity): 6 ng/L (ref <18)

## 2021-10-18 NOTE — Discharge Instructions (Signed)
Continue your z-pack as prescribed.  Make sure to rest, hydrate. Follow-up with your primary care doctor. Return to the ED for new or worsening symptoms.

## 2021-11-28 ENCOUNTER — Other Ambulatory Visit: Payer: Self-pay

## 2021-11-28 ENCOUNTER — Emergency Department (HOSPITAL_COMMUNITY)
Admission: EM | Admit: 2021-11-28 | Discharge: 2021-11-28 | Disposition: A | Discharge status: Home / Self Care | Payer: Medicaid Other | Attending: Emergency Medicine | Admitting: Emergency Medicine

## 2021-11-28 ENCOUNTER — Emergency Department (HOSPITAL_COMMUNITY): Payer: Medicaid Other

## 2021-11-28 ENCOUNTER — Encounter (HOSPITAL_COMMUNITY): Payer: Self-pay

## 2021-11-28 DIAGNOSIS — M533 Sacrococcygeal disorders, not elsewhere classified: Secondary | ICD-10-CM | POA: Diagnosis not present

## 2021-11-28 DIAGNOSIS — W109XXA Fall (on) (from) unspecified stairs and steps, initial encounter: Secondary | ICD-10-CM | POA: Diagnosis not present

## 2021-11-28 DIAGNOSIS — S39012A Strain of muscle, fascia and tendon of lower back, initial encounter: Secondary | ICD-10-CM | POA: Diagnosis not present

## 2021-11-28 DIAGNOSIS — I10 Essential (primary) hypertension: Secondary | ICD-10-CM | POA: Insufficient documentation

## 2021-11-28 DIAGNOSIS — R519 Headache, unspecified: Secondary | ICD-10-CM | POA: Insufficient documentation

## 2021-11-28 DIAGNOSIS — Z7982 Long term (current) use of aspirin: Secondary | ICD-10-CM | POA: Insufficient documentation

## 2021-11-28 DIAGNOSIS — W108XXA Fall (on) (from) other stairs and steps, initial encounter: Secondary | ICD-10-CM

## 2021-11-28 DIAGNOSIS — M545 Low back pain, unspecified: Secondary | ICD-10-CM | POA: Diagnosis present

## 2021-11-28 DIAGNOSIS — Y92009 Unspecified place in unspecified non-institutional (private) residence as the place of occurrence of the external cause: Secondary | ICD-10-CM | POA: Diagnosis not present

## 2021-11-28 DIAGNOSIS — Z79899 Other long term (current) drug therapy: Secondary | ICD-10-CM | POA: Diagnosis not present

## 2021-11-28 MED ORDER — METHOCARBAMOL 500 MG PO TABS: 500.0000 mg | ORAL_TABLET | Freq: Two times a day (BID) | ORAL | 0 refills | Status: AC

## 2021-11-28 MED ORDER — LIDOCAINE 5 % EX PTCH
1.0000 | MEDICATED_PATCH | CUTANEOUS | Status: DC
  Administered 2021-11-28: 1 via TRANSDERMAL
  Filled 2021-11-28: qty 1

## 2021-11-28 MED ORDER — DICLOFENAC SODIUM 1 % EX GEL: 4.0000 g | Freq: Four times a day (QID) | CUTANEOUS | 0 refills | Status: AC

## 2021-11-28 MED ORDER — LISINOPRIL 10 MG PO TABS
20.0000 mg | ORAL_TABLET | Freq: Once | ORAL | Status: AC
  Administered 2021-11-28: 20 mg via ORAL
  Filled 2021-11-28: qty 2

## 2021-11-28 MED ORDER — CYCLOBENZAPRINE HCL 10 MG PO TABS
10.0000 mg | ORAL_TABLET | Freq: Once | ORAL | Status: AC
  Administered 2021-11-28: 10 mg via ORAL
  Filled 2021-11-28: qty 1

## 2021-11-28 NOTE — ED Notes (Signed)
ED Provider at bedside. 

## 2021-11-28 NOTE — ED Triage Notes (Addendum)
Patient reports that she was coming down the steps outside of her house, felt dizzy, tried to grab the posts on the stairs and missed, falling down 4 steps. BP in triage-235/132. Paienat states a headache 2-3 days. Patient denies any blurred vision  Patient denies hitting her head or having LOC. Patient denies blood thinners.  Patient does c/o lower back pain and radiates into the right thigh.

## 2021-11-28 NOTE — Discharge Instructions (Signed)
Warm compress to low back for 20 minutes at a time.  Can apply diclofenac to back as needed as prescribed for pain.  Can also take Robaxin.  Do not drive or operate machinery while taking Robaxin until you to have this medication lethargy.  Also recommend over-the-counter lidocaine patches. Follow-up with your primary care provider for recheck. Take your blood pressure medications as prescribed, monitor your blood pressure and recheck with your primary care provider for further management.

## 2021-11-28 NOTE — ED Notes (Signed)
Pt BP 208/118, Martha Broad, PA made aware.

## 2021-11-28 NOTE — ED Provider Triage Note (Signed)
Emergency Medicine Provider Triage Evaluation Note  Martha Mathews , a 36 y.o. female  was evaluated in triage.  Pt complains of fall down 4-5 wooden steps, on buttocks, slid down on back. Pain to tailbone and across lower back. No other injuries. No LOC. Able to ambulate. Took 2 Tylenol.  LMP- hysterectomy   Review of Systems  Positive: As above Negative: As above  Physical Exam  BP (!) 235/132 (BP Location: Left Arm)   Pulse 85   Temp 98.4 F (36.9 C) (Oral)   Resp 18   Ht '5\' 5"'$  (1.651 m)   Wt 129.3 kg   LMP 04/21/2021   SpO2 98%   BMI 47.43 kg/m  Gen:   Awake, no distress   Resp:  Normal effort  MSK:   Moves extremities without difficulty  Other:  TTP low back  Medical Decision Making  Medically screening exam initiated at 5:26 PM.  Appropriate orders placed.  Bedelia Person was informed that the remainder of the evaluation will be completed by another provider, this initial triage assessment does not replace that evaluation, and the importance of remaining in the ED until their evaluation is complete.     Tacy Learn, PA-C 11/28/21 1728

## 2021-11-28 NOTE — ED Notes (Signed)
PA aware of patients BP.An After Visit Summary was printed and given to the patient. Discharge instructions given and no further questions at this time.  Pt states a friend is taking her home. Pt denies chest pain, denies SOB.

## 2021-11-28 NOTE — ED Provider Notes (Signed)
[place] [facility] DEPT Provider Note   CSN: [id number] Arrival date & time: 11/28/21  1633     History  Chief Complaint  Patient presents with   Fall   Back Pain   Hypertension   Headache    Martha Mathews is a 36 y.o. female.  37 year old female presents for evaluation after a fall down 4-5 wooden steps today and when she laid on her buttocks and slid down onto her back.  She reports pain in her tailbone and across her lower back.  No loss of consciousness, no other injuries, has been ambulatory since the fall.  Took 2 Tylenol prior to arrival. Past medical history of hypertension, states that she is under a lot of stress recently due to family moving in with her temporarily, attributes this to her elevated blood pressure tonight.  Also history of bipolar disorder, Chiari malformation, depression, anemia, migraines and anxiety.       Home Medications Prior to Admission medications   Medication Sig Start Date End Date Taking? Authorizing Provider  diclofenac Sodium (VOLTAREN) 1 % GEL Apply 4 g topically 4 (four) times daily. 11/28/21  Yes Tacy Learn, PA-C  methocarbamol (ROBAXIN) 500 MG tablet Take 1 tablet (500 mg total) by mouth 2 (two) times daily. 11/28/21  Yes Tacy Learn, PA-C  acetaminophen (TYLENOL) 500 MG tablet Take 1,000 mg by mouth every 6 (six) hours as needed for headache.    [provider]  ALPRAZolam Duanne Moron) 1 MG tablet Take 1 mg by mouth 2 (two) times daily as needed for anxiety. 01/09/21   [provider]  amoxicillin-clavulanate (AUGMENTIN) 875-125 MG tablet Take 1 tablet by mouth every 12 (twelve) hours. 05/20/21   Teodora Medici, FNP  aspirin EC 81 MG tablet Take 81 mg by mouth daily. Swallow whole.    [provider]  FLUoxetine (PROZAC) 40 MG capsule Take 40 mg by mouth daily.  06/26/16   [provider]  gabapentin (NEURONTIN) 100 MG capsule Take 200 mg by mouth at bedtime. 09/17/19    [provider]  ibuprofen (ADVIL) 800 MG tablet Take 1 tablet (800 mg total) by mouth every 8 (eight) hours. 06/20/21   Sanjuana Kava, MD  linaclotide (LINZESS) 145 MCG CAPS capsule Take 145 mcg by mouth 2 (two) times a week.    [provider]  lisinopril (ZESTRIL) 20 MG tablet Take 20 mg by mouth daily.    [provider]  oxyCODONE-acetaminophen (PERCOCET/ROXICET) 5-325 MG tablet Take 1-2 tablets by mouth every 4 (four) hours as needed for severe pain. 06/20/21   Sanjuana Kava, MD  QUEtiapine (SEROQUEL) 300 MG tablet Take 300 mg by mouth at bedtime. 10/31/20   [provider]  topiramate (TOPAMAX) 50 MG tablet Take 50 mg by mouth daily.    [provider]  Vitamin D, Ergocalciferol, (DRISDOL) 1.25 MG (50000 UNIT) CAPS capsule Take 50,000 Units by mouth every Wednesday. 08/23/19   [provider]      Allergies    Adhesive [tape], Hydrocodone-acetaminophen, and Iodine    Review of Systems   Review of Systems Negative except as per HPI Physical Exam Updated Vital Signs BP (!) 195/137   Pulse 70   Temp 98.4 F (36.9 C) (Oral)   Resp 18   Ht '5\' 5"'$  (1.651 m)   Wt 129.3 kg   LMP 04/21/2021   SpO2 95%   BMI 47.43 kg/m  Physical Exam Vitals and nursing note reviewed.  Constitutional:  General: She is not in acute distress.    Appearance: She is well-developed. She is not diaphoretic.  HENT:     Head: Normocephalic and atraumatic.  Cardiovascular:     Pulses: Normal pulses.  Pulmonary:     Effort: Pulmonary effort is normal.  Abdominal:     Palpations: Abdomen is soft.     Tenderness: There is no abdominal tenderness.  Musculoskeletal:        General: Tenderness present. No swelling or deformity. Normal range of motion.     Cervical back: No tenderness or bony tenderness.     Thoracic back: No tenderness.     Lumbar back: Tenderness and bony tenderness present.       Back:  Skin:    General: Skin is warm and dry.   Neurological:     Mental Status: She is alert and oriented to person, place, and time.     GCS: GCS eye subscore is 4. GCS verbal subscore is 5. GCS motor subscore is 6.     Sensory: No sensory deficit.     Motor: No weakness.     Gait: Gait normal.  Psychiatric:        Behavior: Behavior normal.     ED Results / Procedures / Treatments   Labs (all labs ordered are listed, but only abnormal results are displayed) Labs Reviewed - No data to display  EKG None  Radiology DG Sacrum/Coccyx  Result Date: 11/28/2021 CLINICAL DATA:  Fall.  Low back pain. EXAM: SACRUM AND COCCYX - 2+ VIEW COMPARISON:  Sacrum/coccyx radiographs 08/12/2017 FINDINGS: No acute fracture or pelvic diastasis is evident. The SI joints are unremarkable. IMPRESSION: Negative. Electronically Signed   By: Logan Bores M.D.   On: 11/28/2021 18:45   DG Lumbar Spine Complete  Result Date: 11/28/2021 CLINICAL DATA:  Fall.  Low back pain. EXAM: LUMBAR SPINE - COMPLETE 4+ VIEW COMPARISON:  Lumbar spine radiographs 04/18/2018 FINDINGS: There are 5 non rib-bearing lumbar vertebrae. The ribs at T12 are small. Vertebral alignment is normal. No acute fracture is identified. Intervertebral disc space heights are preserved with minimal anterior endplate spurring in the lower lumbar spine. There is mild lower lumbar facet arthrosis. Surgical clips are noted in the right upper abdomen. IMPRESSION: No acute osseous abnormality. Electronically Signed   By: Logan Bores M.D.   On: 11/28/2021 18:43    Procedures Procedures    Medications Ordered in ED Medications  lidocaine (LIDODERM) 5 % 1 patch (1 patch Transdermal Patch Applied 11/28/21 1913)  lisinopril (ZESTRIL) tablet 20 mg (20 mg Oral Given 11/28/21 1913)  cyclobenzaprine (FLEXERIL) tablet 10 mg (10 mg Oral Given 11/28/21 1913)    ED Course/ Medical Decision Making/ A&P                           Medical Decision Making Amount and/or Complexity of Data  Reviewed Radiology: ordered.  Risk Prescription drug management.   36 year old female presents for evaluation of pain in her low back and tailbone after falling down steps today as above.  She is found have tenderness across her diffuse lower back without crepitus or step-off, no ecchymosis.  X-ray of the lumbar spine and coccyx is negative for acute bony injury, agree with radiologist interpretation.  Patient's blood pressure is elevated, history of same, is due to take her next scheduled lisinopril dose.  This is provided in the ER.  She is available to monitor her blood pressure  at home, advised to take meds as prescribed and recheck with her PCP if her blood pressure remains elevated.  Provided with Lidoderm patch here and Flexeril, avoiding oral NSAIDs due to hypertension.  She is provided with prescription for topical diclofenac as well as Robaxin.  Recommend over-the-counter lidocaine patches.  Check with primary care provider.        Final Clinical Impression(s) / ED Diagnoses Final diagnoses:  Fall down steps, initial encounter  Strain of lumbar region, initial encounter    Rx / DC Orders ED Discharge Orders          Ordered    diclofenac Sodium (VOLTAREN) 1 % GEL  4 times daily        11/28/21 1857    methocarbamol (ROBAXIN) 500 MG tablet  2 times daily        11/28/21 1857              Tacy Learn, PA-C 11/28/21 1931    Dorie Rank, MD 12/02/21 [phone]

## 2023-02-03 ENCOUNTER — Encounter (HOSPITAL_COMMUNITY): Payer: Self-pay

## 2023-02-03 ENCOUNTER — Other Ambulatory Visit: Payer: Self-pay

## 2023-02-03 ENCOUNTER — Emergency Department (HOSPITAL_COMMUNITY)
Admission: EM | Admit: 2023-02-03 | Discharge: 2023-02-03 | Discharge status: Against Medical Advice | Payer: 59 | Attending: Nurse Practitioner | Admitting: Nurse Practitioner

## 2023-02-03 ENCOUNTER — Encounter: Payer: Self-pay | Admitting: *Deleted

## 2023-02-03 ENCOUNTER — Ambulatory Visit
Admission: EM | Admit: 2023-02-03 | Discharge: 2023-02-03 | Disposition: A | Discharge status: Home / Self Care | Payer: 59 | Attending: Physician Assistant | Admitting: Physician Assistant

## 2023-02-03 DIAGNOSIS — R002 Palpitations: Secondary | ICD-10-CM

## 2023-02-03 DIAGNOSIS — R519 Headache, unspecified: Secondary | ICD-10-CM | POA: Insufficient documentation

## 2023-02-03 DIAGNOSIS — R030 Elevated blood-pressure reading, without diagnosis of hypertension: Secondary | ICD-10-CM | POA: Diagnosis not present

## 2023-02-03 DIAGNOSIS — R42 Dizziness and giddiness: Secondary | ICD-10-CM | POA: Insufficient documentation

## 2023-02-03 DIAGNOSIS — I160 Hypertensive urgency: Secondary | ICD-10-CM | POA: Diagnosis not present

## 2023-02-03 DIAGNOSIS — R0981 Nasal congestion: Secondary | ICD-10-CM | POA: Insufficient documentation

## 2023-02-03 DIAGNOSIS — Z5321 Procedure and treatment not carried out due to patient leaving prior to being seen by health care provider: Secondary | ICD-10-CM | POA: Diagnosis not present

## 2023-02-03 LAB — CBC WITH DIFFERENTIAL/PLATELET
Abs Immature Granulocytes: 0.02 10*3/uL (ref 0.00–0.07)
Basophils Absolute: 0 10*3/uL (ref 0.0–0.1)
Basophils Relative: 0 %
Eosinophils Absolute: 0.1 10*3/uL (ref 0.0–0.5)
Eosinophils Relative: 1 %
HCT: 42.7 % (ref 36.0–46.0)
Hemoglobin: 13.7 g/dL (ref 12.0–15.0)
Immature Granulocytes: 0 %
Lymphocytes Relative: 20 %
Lymphs Abs: 1.7 10*3/uL (ref 0.7–4.0)
MCH: 27.5 pg (ref 26.0–34.0)
MCHC: 32.1 g/dL (ref 30.0–36.0)
MCV: 85.7 fL (ref 80.0–100.0)
Monocytes Absolute: 0.6 10*3/uL (ref 0.1–1.0)
Monocytes Relative: 8 %
Neutro Abs: 5.9 10*3/uL (ref 1.7–7.7)
Neutrophils Relative %: 71 %
Platelets: 288 10*3/uL (ref 150–400)
RBC: 4.98 MIL/uL (ref 3.87–5.11)
RDW: 13.8 % (ref 11.5–15.5)
WBC: 8.4 10*3/uL (ref 4.0–10.5)
nRBC: 0 % (ref 0.0–0.2)

## 2023-02-03 LAB — BASIC METABOLIC PANEL
Anion gap: 6 (ref 5–15)
BUN: 7 mg/dL (ref 6–20)
CO2: 26 mmol/L (ref 22–32)
Calcium: 8.8 mg/dL — ABNORMAL LOW (ref 8.9–10.3)
Chloride: 105 mmol/L (ref 98–111)
Creatinine, Ser: 0.83 mg/dL (ref 0.44–1.00)
GFR, Estimated: >60 mL/min (ref >60)
Glucose, Bld: 99 mg/dL (ref 70–99)
Potassium: 4.3 mmol/L (ref 3.5–5.1)
Sodium: 137 mmol/L (ref 135–145)

## 2023-02-03 MED ORDER — CLONIDINE HCL 0.2 MG PO TABS
0.2000 mg | ORAL_TABLET | Freq: Once | ORAL | Status: AC
  Administered 2023-02-03: 0.2 mg via ORAL
  Filled 2023-02-03: qty 1

## 2023-02-03 NOTE — ED Provider Notes (Signed)
Patient here today for evaluation of palpitations and dizziness with associated headache that started this morning.  Her blood pressure is elevated on exam.  I recommended further evaluation in the emergency room and mother to transport via POV.   Tomi Bamberger, PA-C 02/03/23 [phone]

## 2023-02-03 NOTE — ED Triage Notes (Signed)
Patient c/o high BP, says UC sent her here b/c her BP was 154/102 and they said there was nothing they could do. Patient reports that she has taken her BP meds at home as ordered, current BP 193/122, patient reports headache and dizziness. Patient also reports she has been taking pseudofed for her nasal congestion as well.

## 2023-02-03 NOTE — ED Triage Notes (Signed)
Pt reports her heart was racing this morning the patient felt dizzy ands patient is having a HA. Pt reports she thinks he rBP is UP.

## 2023-02-03 NOTE — ED Notes (Signed)
Pt lwbs stated, "its fine I will call my doctor tomorrow."

## 2023-05-25 ENCOUNTER — Emergency Department (HOSPITAL_COMMUNITY)
Admission: EM | Admit: 2023-05-25 | Discharge: 2023-05-25 | Disposition: A | Discharge status: Home / Self Care | Payer: Self-pay | Attending: Emergency Medicine | Admitting: Emergency Medicine

## 2023-05-25 ENCOUNTER — Other Ambulatory Visit: Payer: Self-pay

## 2023-05-25 ENCOUNTER — Emergency Department (HOSPITAL_COMMUNITY): Payer: Self-pay

## 2023-05-25 ENCOUNTER — Encounter (HOSPITAL_COMMUNITY): Payer: Self-pay | Admitting: Emergency Medicine

## 2023-05-25 DIAGNOSIS — E876 Hypokalemia: Secondary | ICD-10-CM | POA: Insufficient documentation

## 2023-05-25 DIAGNOSIS — R531 Weakness: Secondary | ICD-10-CM | POA: Insufficient documentation

## 2023-05-25 DIAGNOSIS — G43909 Migraine, unspecified, not intractable, without status migrainosus: Secondary | ICD-10-CM | POA: Insufficient documentation

## 2023-05-25 DIAGNOSIS — Z79899 Other long term (current) drug therapy: Secondary | ICD-10-CM | POA: Insufficient documentation

## 2023-05-25 DIAGNOSIS — Z7982 Long term (current) use of aspirin: Secondary | ICD-10-CM | POA: Insufficient documentation

## 2023-05-25 DIAGNOSIS — I10 Essential (primary) hypertension: Secondary | ICD-10-CM | POA: Insufficient documentation

## 2023-05-25 LAB — CBC
HCT: 42.7 % (ref 36.0–46.0)
Hemoglobin: 13.9 g/dL (ref 12.0–15.0)
MCH: 27.4 pg (ref 26.0–34.0)
MCHC: 32.6 g/dL (ref 30.0–36.0)
MCV: 84.1 fL (ref 80.0–100.0)
Platelets: 325 10*3/uL (ref 150–400)
RBC: 5.08 MIL/uL (ref 3.87–5.11)
RDW: 13.7 % (ref 11.5–15.5)
WBC: 9.2 10*3/uL (ref 4.0–10.5)
nRBC: 0 % (ref 0.0–0.2)

## 2023-05-25 LAB — BASIC METABOLIC PANEL WITH GFR
Anion gap: 10 (ref 5–15)
BUN: 7 mg/dL (ref 6–20)
CO2: 25 mmol/L (ref 22–32)
Calcium: 8.7 mg/dL — ABNORMAL LOW (ref 8.9–10.3)
Chloride: 103 mmol/L (ref 98–111)
Creatinine, Ser: 0.77 mg/dL (ref 0.44–1.00)
GFR, Estimated: >60 mL/min (ref >60)
Glucose, Bld: 110 mg/dL — ABNORMAL HIGH (ref 70–99)
Potassium: 3.4 mmol/L — ABNORMAL LOW (ref 3.5–5.1)
Sodium: 138 mmol/L (ref 135–145)

## 2023-05-25 LAB — URINALYSIS, ROUTINE W REFLEX MICROSCOPIC
Bilirubin Urine: NEGATIVE
Glucose, UA: NEGATIVE mg/dL
Hgb urine dipstick: NEGATIVE
Ketones, ur: NEGATIVE mg/dL
Leukocytes,Ua: NEGATIVE
Nitrite: NEGATIVE
Protein, ur: NEGATIVE mg/dL
Specific Gravity, Urine: 1.011 (ref 1.005–1.030)
pH: 8 (ref 5.0–8.0)

## 2023-05-25 LAB — MAGNESIUM: Magnesium: 2 mg/dL (ref 1.7–2.4)

## 2023-05-25 MED ORDER — LORAZEPAM 2 MG/ML IJ SOLN
0.5000 mg | Freq: Once | INTRAMUSCULAR | Status: AC
Start: 2023-05-25 — End: 2023-05-25
  Administered 2023-05-25: 0.5 mg via INTRAVENOUS
  Filled 2023-05-25: qty 1

## 2023-05-25 MED ORDER — KETOROLAC TROMETHAMINE 15 MG/ML IJ SOLN
15.0000 mg | Freq: Once | INTRAMUSCULAR | Status: AC
  Administered 2023-05-25: 15 mg via INTRAVENOUS
  Filled 2023-05-25: qty 1

## 2023-05-25 MED ORDER — DIPHENHYDRAMINE HCL 50 MG/ML IJ SOLN
12.5000 mg | Freq: Once | INTRAMUSCULAR | Status: AC
  Administered 2023-05-25: 12.5 mg via INTRAVENOUS
  Filled 2023-05-25: qty 1

## 2023-05-25 MED ORDER — PROCHLORPERAZINE EDISYLATE 10 MG/2ML IJ SOLN
10.0000 mg | Freq: Once | INTRAMUSCULAR | Status: AC
  Administered 2023-05-25: 10 mg via INTRAVENOUS
  Filled 2023-05-25: qty 2

## 2023-05-25 MED ORDER — HYDRALAZINE HCL 20 MG/ML IJ SOLN
10.0000 mg | Freq: Once | INTRAMUSCULAR | Status: AC
  Administered 2023-05-25: 10 mg via INTRAVENOUS
  Filled 2023-05-25: qty 1

## 2023-05-25 MED ORDER — LACTATED RINGERS IV BOLUS
1000.0000 mL | Freq: Once | INTRAVENOUS | Status: AC
  Administered 2023-05-25: 1000 mL via INTRAVENOUS

## 2023-05-25 MED ORDER — POTASSIUM CHLORIDE CRYS ER 20 MEQ PO TBCR
40.0000 meq | EXTENDED_RELEASE_TABLET | Freq: Once | ORAL | Status: AC
  Administered 2023-05-25: 40 meq via ORAL
  Filled 2023-05-25: qty 2

## 2023-05-25 NOTE — ED Provider Notes (Signed)
 [place] EMERGENCY DEPARTMENT AT [facility] Provider Note   CSN: [id number] Arrival date & time: 05/25/23  1235     History  Chief Complaint  Patient presents with   Migraine   Dizziness   Hypertension    Martha Mathews is a 38 y.o. female.   Migraine Associated symptoms include headaches.  Dizziness Associated symptoms: headaches and weakness   Hypertension Associated symptoms include headaches.  Patient presents for headache.  Medical history includes depression, anxiety, bipolar disorder, anemia, HTN, Chiari I malformation.  Since only this morning, she has had pressure type headache, primarily in posterior and right-sided head.  She has associated photophobia.  Symptoms reminiscent of prior migraine headaches.  She was able to take her morning blood pressure medication today.  She endorses some right arm weakness and paresthesias.  She denies any other current or recent symptoms.     Home Medications Prior to Admission medications   Medication Sig Start Date End Date Taking? Authorizing Provider  acetaminophen (TYLENOL) 500 MG tablet Take 1,000 mg by mouth every 6 (six) hours as needed for headache.    [provider]  ALPRAZolam Prudy Feeler) 1 MG tablet Take 1 mg by mouth 2 (two) times daily as needed for anxiety. 01/09/21   [provider]  amoxicillin-clavulanate (AUGMENTIN) 875-125 MG tablet Take 1 tablet by mouth every 12 (twelve) hours. 05/20/21   Gustavus Bryant, FNP  aspirin EC 81 MG tablet Take 81 mg by mouth daily. Swallow whole.    [provider]  diclofenac Sodium (VOLTAREN) 1 % GEL Apply 4 g topically 4 (four) times daily. 11/28/21   Jeannie Fend, PA-C  FLUoxetine (PROZAC) 40 MG capsule Take 40 mg by mouth daily.  06/26/16   [provider]  gabapentin (NEURONTIN) 100 MG capsule Take 200 mg by mouth at bedtime. 09/17/19   [provider]  ibuprofen (ADVIL) 800 MG tablet Take 1 tablet (800 mg total) by  mouth every 8 (eight) hours. 06/20/21   Essie Hart, MD  linaclotide (LINZESS) 145 MCG CAPS capsule Take 145 mcg by mouth 2 (two) times a week.    [provider]  lisinopril (ZESTRIL) 20 MG tablet Take 20 mg by mouth daily.    [provider]  methocarbamol (ROBAXIN) 500 MG tablet Take 1 tablet (500 mg total) by mouth 2 (two) times daily. 11/28/21   Jeannie Fend, PA-C  oxyCODONE-acetaminophen (PERCOCET/ROXICET) 5-325 MG tablet Take 1-2 tablets by mouth every 4 (four) hours as needed for severe pain. 06/20/21   Essie Hart, MD  QUEtiapine (SEROQUEL) 300 MG tablet Take 300 mg by mouth at bedtime. 10/31/20   [provider]  topiramate (TOPAMAX) 50 MG tablet Take 50 mg by mouth daily.    [provider]  Vitamin D, Ergocalciferol, (DRISDOL) 1.25 MG (50000 UNIT) CAPS capsule Take 50,000 Units by mouth every Wednesday. 08/23/19   [provider]      Allergies    Adhesive [tape], Hydrocodone-acetaminophen, and Iodine    Review of Systems   Review of Systems  Eyes:  Positive for photophobia.  Neurological:  Positive for weakness, numbness and headaches.  All other systems reviewed and are negative.   Physical Exam Updated Vital Signs BP (!) 168/88   Pulse 74   Temp 98.5 F (36.9 C) (Oral)   Resp 18   Ht 5\' 4"  (1.626 m)   Wt 125.2 kg   LMP 04/21/2021   SpO2 100%   BMI 47.38  kg/m  Physical Exam Vitals and nursing note reviewed.  Constitutional:      General: She is not in acute distress.    Appearance: Normal appearance. She is well-developed. She is not ill-appearing, toxic-appearing or diaphoretic.  HENT:     Head: Normocephalic and atraumatic.     Right Ear: External ear normal.     Left Ear: External ear normal.     Nose: Nose normal.     Mouth/Throat:     Mouth: Mucous membranes are moist.  Eyes:     Extraocular Movements: Extraocular movements intact.     Conjunctiva/sclera: Conjunctivae normal.  Cardiovascular:     Rate and  Rhythm: Normal rate and regular rhythm.  Pulmonary:     Effort: Pulmonary effort is normal. No respiratory distress.  Abdominal:     General: Abdomen is flat. There is no distension.  Musculoskeletal:        General: No swelling or deformity. Normal range of motion.     Cervical back: Normal range of motion and neck supple.  Skin:    General: Skin is warm and dry.     Coloration: Skin is not jaundiced or pale.  Neurological:     General: No focal deficit present.     Mental Status: She is alert and oriented to person, place, and time.     Cranial Nerves: Cranial nerves 2-12 are intact. No cranial nerve deficit, dysarthria or facial asymmetry.     Sensory: Sensation is intact. No sensory deficit.     Motor: No weakness, abnormal muscle tone or pronator drift.     Coordination: Coordination normal. Finger-Nose-Finger Test normal.  Psychiatric:        Mood and Affect: Mood normal.        Behavior: Behavior normal.        Thought Content: Thought content normal.        Judgment: Judgment normal.     ED Results / Procedures / Treatments   Labs (all labs ordered are listed, but only abnormal results are displayed) Labs Reviewed  BASIC METABOLIC PANEL WITH GFR - Abnormal; Notable for the following components:      Result Value   Potassium 3.4 (*)    Glucose, Bld 110 (*)    Calcium 8.7 (*)    All other components within normal limits  URINALYSIS, ROUTINE W REFLEX MICROSCOPIC - Abnormal; Notable for the following components:   APPearance CLOUDY (*)    All other components within normal limits  CBC  MAGNESIUM    EKG None  Radiology CT HEAD WO CONTRAST Result Date: 05/25/2023 CLINICAL DATA:  Headache, increasing frequency or severity EXAM: CT HEAD WITHOUT CONTRAST TECHNIQUE: Contiguous axial images were obtained from the base of the skull through the vertex without intravenous contrast. RADIATION DOSE REDUCTION: This exam was performed according to the departmental  dose-optimization program which includes automated exposure control, adjustment of the mA and/or kV according to patient size and/or use of iterative reconstruction technique. COMPARISON:  None Available. FINDINGS: Brain: No evidence of large-territorial acute infarction. No parenchymal hemorrhage. No mass lesion. No extra-axial collection. No mass effect or midline shift. No hydrocephalus. Basilar cisterns are patent. Vascular: No hyperdense vessel. Skull: No acute fracture or focal lesion. Sinuses/Orbits: Paranasal sinuses and mastoid air cells are clear. The orbits are unremarkable. Other: None. IMPRESSION: No acute intracranial abnormality. Electronically Signed   By: Tish Frederickson M.D.   On: 05/25/2023 19:16    Procedures Procedures    Medications Ordered  in ED Medications  hydrALAZINE (APRESOLINE) injection 10 mg (10 mg Intravenous Given 05/25/23 1418)  ketorolac (TORADOL) 15 MG/ML injection 15 mg (15 mg Intravenous Given 05/25/23 1516)  prochlorperazine (COMPAZINE) injection 10 mg (10 mg Intravenous Given 05/25/23 1517)  diphenhydrAMINE (BENADRYL) injection 12.5 mg (12.5 mg Intravenous Given 05/25/23 1516)  potassium chloride SA (KLOR-CON M) CR tablet 40 mEq (40 mEq Oral Given 05/25/23 1517)  lactated ringers bolus 1,000 mL (0 mLs Intravenous Stopped 05/25/23 1644)  LORazepam (ATIVAN) injection 0.5 mg (0.5 mg Intravenous Given 05/25/23 1852)    ED Course/ Medical Decision Making/ A&P                                 Medical Decision Making Amount and/or Complexity of Data Reviewed Labs: ordered. Radiology: ordered.  Risk Prescription drug management.   This patient presents to the ED for concern of headache and paresthesias, this involves an extensive number of treatment options, and is a complaint that carries with it a high risk of complications and morbidity.  The differential diagnosis includes migraine headache, CVA, TIA, neoplasm, hypertensive crisis   Co morbidities that complicate  the patient evaluation  depression, anxiety, bipolar disorder, anemia, HTN, Chiari I malformation   Additional history obtained:  Additional history obtained from N/A External records from outside source obtained and reviewed including EMR   Lab Tests:  I Ordered, and personally interpreted labs.  The pertinent results include: Mild hypokalemia with otherwise normal electrolytes, normal hemoglobin, no leukocytosis   Imaging Studies ordered:  I ordered imaging studies including CT head I independently visualized and interpreted imaging which showed no acute findings I agree with the radiologist interpretation   Cardiac Monitoring: / EKG:  The patient was maintained on a cardiac monitor.  I personally viewed and interpreted the cardiac monitored which showed an underlying rhythm of: Sinus rhythm   Problem List / ED Course / Critical interventions / Medication management  Patient presenting for right-sided headache.  Found to be markedly hypertensive on arrival in the ED.  She was given dose of hydralazine in triage.  She did take her morning lisinopril.  On assessment, patient is overall well-appearing.  She endorses an ongoing pressure feeling in the right side of her head.  On exam, she seems to have a very faint weakness in right grip strength.  She endorses paresthesias to right hand with test of sensation.  Exam findings not consistent with LVO.  Will obtain imaging to rule out CVA or neoplasm.  Headache cocktail was ordered.  Lab work notable for slight hypokalemia.  Replacement potassium was ordered.  Lab work was otherwise unremarkable.  CT of head did not show any acute findings.  Patient had resolution of symptoms while in the ED following headache cocktail.  With resolution of her headache pressure, blood pressure also improved.  Patient declined MRI which, given resolution of symptoms, is reasonable.  She was discharged in stable condition. I ordered medication including IV  fluids for hydration; Toradol, Benadryl, Compazine for headache; potassium chloride for hypokalemia Reevaluation of the patient after these medicines showed that the patient resolved I have reviewed the patients home medicines and have made adjustments as needed   Social Determinants of Health:  Lives independently        Final Clinical Impression(s) / ED Diagnoses Final diagnoses:  Migraine without status migrainosus, not intractable, unspecified migraine type    Rx / DC Orders ED  Discharge Orders     None         Gloris Manchester, MD 05/25/23 1932

## 2023-05-25 NOTE — ED Notes (Signed)
 Patient transported to MRI

## 2023-05-25 NOTE — Discharge Instructions (Addendum)
 Your test results today are reassuring.  Talk to primary care doctor about ongoing management of your hypertension.  Return to the emergency department for any new or worsening symptoms of concern.

## 2023-05-25 NOTE — ED Triage Notes (Signed)
 Pt woke at 6am with headache nausea and dizziness. Head pressure has gotten worse as the day progressed and worse on right side and worse with turning head. Sensitive to light.

## 2023-05-25 NOTE — ED Triage Notes (Signed)
 PT states took 40 mg Lisinopril at 8am

## 2023-05-25 NOTE — ED Provider Triage Note (Signed)
 Emergency Medicine Provider Triage Evaluation Note  Martha Mathews , a 38 y.o. female  was evaluated in triage.  Pt complains of HA since 0600 this morning. HA described as "pressure" in back and right side of head with associated photophobia. Hx of migraines (which are typically all over) and Chiari malformation   Normally takes Lisinopril 40mg  in the morning. Took lisinopril at 0900. Has been complaints with medication  Review of Systems  Positive: HA, photophobia, HTN Negative: CP, SHOB  Physical Exam  BP (!) 205/127   Pulse 77   Temp 98.2 F (36.8 C) (Oral)   Resp 18   Ht 5\' 4"  (1.626 m)   Wt 125.2 kg   LMP 04/21/2021   SpO2 100%   BMI 47.38 kg/m  Gen:   Awake, no distress   Resp:  Normal effort  MSK:   Moves extremities without difficulty  Other:  Neuro intact  Medical Decision Making  Medically screening exam initiated at 1:21 PM.  Appropriate orders placed.  Martha Mathews was informed that the remainder of the evaluation will be completed by another provider, this initial triage assessment does not replace that evaluation, and the importance of remaining in the ED until their evaluation is complete.  Labs ordered. Hydralazine ordered for HTN   Martha Mathews, [place] 05/25/23 1333

## 2023-11-14 ENCOUNTER — Encounter (HOSPITAL_COMMUNITY): Payer: Self-pay | Admitting: Pharmacy Technician

## 2023-11-14 ENCOUNTER — Emergency Department (HOSPITAL_COMMUNITY)
Admission: EM | Admit: 2023-11-14 | Discharge: 2023-11-14 | Disposition: A | Discharge status: Home / Self Care | Payer: Self-pay | Attending: Emergency Medicine | Admitting: Emergency Medicine

## 2023-11-14 ENCOUNTER — Other Ambulatory Visit: Payer: Self-pay

## 2023-11-14 DIAGNOSIS — R519 Headache, unspecified: Secondary | ICD-10-CM

## 2023-11-14 DIAGNOSIS — I10 Essential (primary) hypertension: Secondary | ICD-10-CM | POA: Insufficient documentation

## 2023-11-14 DIAGNOSIS — Z7982 Long term (current) use of aspirin: Secondary | ICD-10-CM | POA: Insufficient documentation

## 2023-11-14 LAB — BASIC METABOLIC PANEL WITH GFR
Anion gap: 11 (ref 5–15)
BUN: 10 mg/dL (ref 6–20)
CO2: 27 mmol/L (ref 22–32)
Calcium: 8.9 mg/dL (ref 8.9–10.3)
Chloride: 102 mmol/L (ref 98–111)
Creatinine, Ser: 0.82 mg/dL (ref 0.44–1.00)
GFR, Estimated: >60 mL/min (ref >60)
Glucose, Bld: 128 mg/dL — ABNORMAL HIGH (ref 70–99)
Potassium: 3.7 mmol/L (ref 3.5–5.1)
Sodium: 140 mmol/L (ref 135–145)

## 2023-11-14 LAB — CBC
HCT: 41.3 % (ref 36.0–46.0)
Hemoglobin: 13.5 g/dL (ref 12.0–15.0)
MCH: 27.7 pg (ref 26.0–34.0)
MCHC: 32.7 g/dL (ref 30.0–36.0)
MCV: 84.6 fL (ref 80.0–100.0)
Platelets: 310 K/uL (ref 150–400)
RBC: 4.88 MIL/uL (ref 3.87–5.11)
RDW: 13.6 % (ref 11.5–15.5)
WBC: 9.2 K/uL (ref 4.0–10.5)
nRBC: 0 % (ref 0.0–0.2)

## 2023-11-14 MED ORDER — KETOROLAC TROMETHAMINE 15 MG/ML IJ SOLN
15.0000 mg | Freq: Once | INTRAMUSCULAR | Status: AC
  Administered 2023-11-14: 15 mg via INTRAMUSCULAR
  Filled 2023-11-14: qty 1

## 2023-11-14 MED ORDER — ACETAMINOPHEN 500 MG PO TABS
1000.0000 mg | ORAL_TABLET | Freq: Once | ORAL | Status: AC
  Administered 2023-11-14: 1000 mg via ORAL
  Filled 2023-11-14: qty 2

## 2023-11-14 MED ORDER — LISINOPRIL 20 MG PO TABS
20.0000 mg | ORAL_TABLET | Freq: Every day | ORAL | Status: DC
  Administered 2023-11-14: 20 mg via ORAL
  Filled 2023-11-14: qty 1

## 2023-11-14 MED ORDER — KETOROLAC TROMETHAMINE 15 MG/ML IJ SOLN: 15.0000 mg | Freq: Once | INTRAMUSCULAR | Status: DC

## 2023-11-14 MED ORDER — IBUPROFEN 600 MG PO TABS
600.0000 mg | ORAL_TABLET | Freq: Four times a day (QID) | ORAL | 0 refills | Status: AC | PRN
  Filled 2023-11-14: qty 30, 8d supply, fill #0

## 2023-11-14 MED ORDER — LISINOPRIL 20 MG PO TABS
20.0000 mg | ORAL_TABLET | Freq: Every day | ORAL | 1 refills | Status: AC
  Filled 2023-11-14: qty 30, 30d supply, fill #0

## 2023-11-14 NOTE — Discharge Instructions (Signed)

## 2023-11-14 NOTE — ED Provider Notes (Signed)
 [place] EMERGENCY DEPARTMENT AT [facility] Provider Note   CSN: [id number] Arrival date & time: 11/14/23  1429     Patient presents with: Hypertension   Martha Mathews is a 38 y.o. female who presents to the ED today out of concern for her blood pressure, previously on lisinopril  for same however has not been taking it over the last month and a half due to her insurance status.  Further concern today she had some increased dizziness as well as headache, primarily at the base of her neck and skull.  Does note increased photophobia, increased headache with bright light exposure.  Previously noted medical history of Chiari I malformation, also has a previous diagnosis of positional vertigo.  Further review shows previous diagnosis of of migraines, depression, anxiety, bipolar 1 disorder.  Patient had a total hysterectomy and salpingectomy back in 2023.    Hypertension Associated symptoms include headaches.       Prior to Admission medications   Medication Sig Start Date End Date Taking? Authorizing Provider  ibuprofen  (ADVIL ) 600 MG tablet Take 1 tablet (600 mg total) by mouth every 6 (six) hours as needed. 11/14/23  Yes Martha Dorn BROCKS, PA  lisinopril  (ZESTRIL ) 20 MG tablet Take 1 tablet (20 mg total) by mouth daily. 11/14/23 12/14/23 Yes Martha Dorn BROCKS, PA  acetaminophen  (TYLENOL ) 500 MG tablet Take 1,000 mg by mouth every 6 (six) hours as needed for headache.    [provider]  ALPRAZolam  (XANAX ) 1 MG tablet Take 1 mg by mouth 2 (two) times daily as needed for anxiety. 01/09/21   [provider]  amoxicillin -clavulanate (AUGMENTIN ) 875-125 MG tablet Take 1 tablet by mouth every 12 (twelve) hours. 05/20/21   Hazen Darryle BRAVO, FNP  aspirin EC 81 MG tablet Take 81 mg by mouth daily. Swallow whole.    [provider]  diclofenac  Sodium (VOLTAREN ) 1 % GEL Apply 4 g topically 4 (four) times daily. 11/28/21   Beverley Leita LABOR, PA-C  FLUoxetine   (PROZAC ) 40 MG capsule Take 40 mg by mouth daily.  06/26/16   [provider]  gabapentin  (NEURONTIN ) 100 MG capsule Take 200 mg by mouth at bedtime. 09/17/19   [provider]  ibuprofen  (ADVIL ) 800 MG tablet Take 1 tablet (800 mg total) by mouth every 8 (eight) hours. 06/20/21   Pinn, Walda, MD  linaclotide (LINZESS) 145 MCG CAPS capsule Take 145 mcg by mouth 2 (two) times a week.    [provider]  lisinopril  (ZESTRIL ) 20 MG tablet Take 20 mg by mouth daily.    [provider]  methocarbamol  (ROBAXIN ) 500 MG tablet Take 1 tablet (500 mg total) by mouth 2 (two) times daily. 11/28/21   Beverley Leita LABOR, PA-C  oxyCODONE -acetaminophen  (PERCOCET/ROXICET) 5-325 MG tablet Take 1-2 tablets by mouth every 4 (four) hours as needed for severe pain. 06/20/21   Pinn, Walda, MD  QUEtiapine  (SEROQUEL ) 300 MG tablet Take 300 mg by mouth at bedtime. 10/31/20   [provider]  topiramate  (TOPAMAX ) 50 MG tablet Take 50 mg by mouth daily.    [provider]  Vitamin D, Ergocalciferol, (DRISDOL) 1.25 MG (50000 UNIT) CAPS capsule Take 50,000 Units by mouth every Wednesday. 08/23/19   [provider]    Allergies: Adhesive [tape], Hydrocodone -acetaminophen , and Iodine    Review of Systems  Neurological:  Positive for dizziness and headaches.  All other systems reviewed and are negative.   Updated Vital Signs BP (!) 156/86   Pulse ROLLEN)  58   Temp 98.3 F (36.8 C) (Oral)   Resp 15   Ht 5' 5 (1.651 m)   Wt 117.5 kg   LMP 04/21/2021   SpO2 100%   BMI 43.10 kg/m   Physical Exam Vitals and nursing note reviewed.  Constitutional:      General: She is not in acute distress.    Appearance: Normal appearance. She is obese.  HENT:     Head: Normocephalic and atraumatic.     Mouth/Throat:     Mouth: Mucous membranes are moist.     Pharynx: Oropharynx is clear.  Eyes:     General: Lids are normal. Vision grossly intact. Gaze aligned appropriately.      Extraocular Movements: Extraocular movements intact.     Conjunctiva/sclera: Conjunctivae normal.     Pupils: Pupils are equal, round, and reactive to light.     Comments: Normal funduscopic exam bilaterally.  Cardiovascular:     Rate and Rhythm: Normal rate and regular rhythm.     Pulses: Normal pulses.     Heart sounds: Normal heart sounds. No murmur heard.    No friction rub. No gallop.  Pulmonary:     Effort: Pulmonary effort is normal.     Breath sounds: Normal breath sounds.  Abdominal:     General: Abdomen is flat. Bowel sounds are normal.     Palpations: Abdomen is soft.  Musculoskeletal:        General: Normal range of motion.     Cervical back: Normal range of motion and neck supple.     Right lower leg: No edema.     Left lower leg: No edema.  Skin:    General: Skin is warm and dry.     Capillary Refill: Capillary refill takes less than 2 seconds.  Neurological:     General: No focal deficit present.     Mental Status: She is alert and oriented to person, place, and time. Mental status is at baseline.     GCS: GCS eye subscore is 4. GCS verbal subscore is 5. GCS motor subscore is 6.     Cranial Nerves: Cranial nerves 2-12 are intact.     Sensory: Sensation is intact.     Motor: Motor function is intact.     Coordination: Coordination is intact.  Psychiatric:        Mood and Affect: Mood normal.     (all labs ordered are listed, but only abnormal results are displayed) Labs Reviewed  BASIC METABOLIC PANEL WITH GFR - Abnormal; Notable for the following components:      Result Value   Glucose, Bld 128 (*)    All other components within normal limits  CBC    EKG: None  Radiology: No results found.   Procedures   Medications Ordered in the ED  lisinopril  (ZESTRIL ) tablet 20 mg (20 mg Oral Given 11/14/23 1600)  acetaminophen  (TYLENOL ) tablet 1,000 mg (1,000 mg Oral Given 11/14/23 1757)  ketorolac  (TORADOL ) 15 MG/ML injection 15 mg (15 mg Intramuscular Given  11/14/23 1938)    Clinical Course as of 11/14/23 2037  Sat Nov 14, 2023  1842 On reevaluation of the patient, she states that she is now has pressure behind both ears as well as the previously noted cervical headache.  Denies having any new onset dizziness or vertigo. [JG]    Clinical Course User Index [JG] Martha Dorn BROCKS, PA  Medical Decision Making Amount and/or Complexity of Data Reviewed Labs: ordered.  Risk OTC drugs. Prescription drug management.   Medical Decision Making:   CALINA PATRIE is a 38 y.o. female who presented to the ED today with noted hypertension and headache/dizziness.  Detailed above.     Complete initial physical exam performed, notably the patient  was alert and oriented in no apparent distress.  Physical exam was largely unremarkable with normal funduscopic exam, normal neurologic exam.    Reviewed and confirmed nursing documentation for past medical history, family history, social history.    Initial Assessment:   With the patient's presentation of headache/dizziness with hypertension, most likely diagnosis is poorly controlled hypertension.  Consider sequelae of uncontrolled hypertension, hypertensive retinopathy, hypertensive nephropathy, headache secondary to Chiari malformation previously noted, pseudotumor cerebri, previous migraine history, consider current migraine.  Initial Plan:   Screening labs including CBC and Metabolic panel to evaluate for infectious or metabolic etiology of disease.  Provide patient with dose of her previously prescribed lisinopril  and reevaluate blood pressure. Provide acetaminophen  for headache Objective evaluation as below reviewed   Initial Study Results:   Laboratory  All laboratory results reviewed without evidence of clinically relevant pathology.   Exceptions include: None   Reassessment and Plan:   On reassessment of this patient, she had only modest change with  acetaminophen , provided her with a dose of IV Toradol  which did cause a significant decrease in her headache.  Reevaluated blood pressure, there has been significant improvement since receiving her lisinopril .  As such, patient was provided with p.o. challenge, tolerated both solid and liquids without difficulty.  Given her blood pressure is greatly improved, and her headache has improved with ketorolac , plan at this time is to discharge with outpatient follow-up to primary care.  Prescription sent in for her blood pressure medication as well for the ibuprofen  to manage with her headache.  Believe headache is either cervicogenic in nature, possibly migraine, but she does not have any neurologic deficits and no concern for CVA at this time.  Given that she has no concerning findings, and improved with medications provided, she is stable for discharge and outpatient management at this time.  This was discussed with the patient, they understand agree and have no further concerns at this time.       Final diagnoses:  Uncontrolled hypertension  Acute nonintractable headache, unspecified headache type    ED Discharge Orders          Ordered    lisinopril  (ZESTRIL ) 20 MG tablet  Daily        11/14/23 1839    ibuprofen  (ADVIL ) 600 MG tablet  Every 6 hours PRN        11/14/23 2020               Martha Dorn BROCKS, PA 11/14/23 2037    Melvenia Motto, MD 11/14/23 2045

## 2023-11-14 NOTE — ED Notes (Signed)
 Pt denies any chest pain. Endorses posterior headache 6/10, dizziness, and shob. O2 100%. No acute distress at this time.

## 2023-11-14 NOTE — ED Notes (Signed)
 Pt reports pressure in her jaws, behind her ears that started about 10 mins ago. Provider notified.

## 2023-11-14 NOTE — ED Triage Notes (Signed)
 PT states her head was hurting and she became dizzy when she stood up.  She then took her bp and it was 217/110.  Pt lost her insurance and has been unable to refill her bp rx for some time.

## 2023-11-15 ENCOUNTER — Other Ambulatory Visit (HOSPITAL_COMMUNITY): Payer: Self-pay

## 2023-12-27 ENCOUNTER — Other Ambulatory Visit (HOSPITAL_BASED_OUTPATIENT_CLINIC_OR_DEPARTMENT_OTHER): Payer: Self-pay

## 2023-12-27 ENCOUNTER — Ambulatory Visit
Admission: EM | Admit: 2023-12-27 | Discharge: 2023-12-27 | Disposition: A | Discharge status: Home / Self Care | Payer: Self-pay | Attending: Student | Admitting: Student

## 2023-12-27 DIAGNOSIS — L089 Local infection of the skin and subcutaneous tissue, unspecified: Secondary | ICD-10-CM

## 2023-12-27 MED ORDER — CEPHALEXIN 500 MG PO CAPS
500.0000 mg | ORAL_CAPSULE | Freq: Four times a day (QID) | ORAL | 0 refills | Status: AC
  Filled 2023-12-27: qty 20, 5d supply, fill #0

## 2023-12-27 NOTE — ED Provider Notes (Signed)
 [facility]    CSN: [id number] Arrival date & time: 12/27/23  0850      History   Chief Complaint Chief Complaint  Patient presents with   Finger Injury    HPI Martha Mathews is a 38 y.o. female presenting with L middle finger injury. H/o hypertension. Patient reports left hand (middle finger) accidentally getting stepped on by daughter yesterday when playing and a small splinter/piece of wood maybe got inside tip. Now swelling/pain.   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   Ringworm    Urinary tract infection     Patient Active Problem List   Diagnosis Date Noted   Dysmenorrhea 06/19/2021   S/P laparoscopic hysterectomy 06/19/2021   Vertigo--positional 09/14/2012   Unplanned wanted pregnancy 09/04/2011   Chiari I malformation (HCC) 05/30/2011   Preventative health care 10/23/2010   Headache 08/26/2010   MAJOR DEPRESSIVE DISORDER RECURRENT EPISODE MILD 09/21/2007    Past Surgical History:  Procedure Laterality Date   BILATERAL SALPINGECTOMY Bilateral 04/21/2012   Procedure: BILATERAL SALPINGECTOMY;  Surgeon: Krystal Deaner, MD;  Location: [facility];  Service: Obstetrics;  Laterality: Bilateral;   CESAREAN SECTION  2006   CESAREAN SECTION N/A 04/21/2012   Procedure: CESAREAN SECTION;  Surgeon: Krystal Deaner, MD;  Location: [facility];  Service: Obstetrics;  Laterality: N/A;  Repeat   CHOLECYSTECTOMY  2006   CYSTOSCOPY  06/19/2021   Procedure: CYSTOSCOPY;  Surgeon: Bettina Muskrat, MD;  Location: [facility];  Service: Gynecology;;   EYE SURGERY     left eye surgery - lazy eye    INDUCED ABORTION     vaccum on oct 2009   MYRINGOTOMY     as a child   TONSILLECTOMY     as a child   TOTAL LAPAROSCOPIC HYSTERECTOMY WITH SALPINGECTOMY Bilateral 06/19/2021   Procedure: TOTAL LAPAROSCOPIC HYSTERECTOMY WITH SALPINGECTOMY;  Surgeon: Bettina Muskrat, MD;  Location: [facility];  Service: Gynecology;  Laterality: Bilateral;   TUBAL LIGATION  2014   pt believes a portion of fallopian tube(s) were removed    OB History     Gravida  3   Para  2   Term  2   Preterm  0   AB  1   Living  2      SAB      IAB  1   Ectopic      Multiple      Live Births  2            Home Medications    Prior to Admission medications   Medication Sig Start Date End Date Taking? Authorizing Provider  acetaminophen  (TYLENOL ) 500 MG tablet Take 1,000 mg by mouth every 6 (six) hours as needed for headache.   Yes [provider]  cephALEXin (KEFLEX) 500 MG capsule Take 1 capsule (500 mg total) by mouth 4 (four) times daily. 12/27/23  Yes Ranbir Chew E, PA-C  ALPRAZolam  (XANAX ) 1 MG  tablet Take 1 mg by mouth 2 (two) times daily as needed for anxiety. 01/09/21   [provider]  aspirin EC 81 MG tablet Take 81 mg by mouth daily. Swallow whole.    [provider]  diclofenac  Sodium (VOLTAREN ) 1 % GEL Apply 4 g topically 4 (four) times daily. 11/28/21   Beverley Leita LABOR, PA-C  FLUoxetine  (PROZAC ) 40 MG capsule Take 40 mg by mouth daily.  06/26/16   [provider]  gabapentin  (NEURONTIN ) 100 MG capsule Take 200 mg by mouth at bedtime. 09/17/19   [provider]  ibuprofen  (ADVIL ) 600 MG tablet Take 1 tablet (600 mg total) by mouth every 6 (six) hours as needed. 11/14/23   Myriam Dorn BROCKS, PA  ibuprofen  (ADVIL ) 800 MG tablet Take 1 tablet (800 mg total) by mouth every 8 (eight) hours. 06/20/21   Pinn, Walda, MD  linaclotide (LINZESS) 145 MCG CAPS capsule Take 145 mcg by mouth 2 (two) times a week.    [provider]  lisinopril  (ZESTRIL )  20 MG tablet Take 20 mg by mouth daily.    [provider]  lisinopril  (ZESTRIL ) 20 MG tablet Take 1 tablet (20 mg total) by mouth daily. 11/14/23 12/14/23  Myriam Dorn BROCKS, PA  methocarbamol  (ROBAXIN ) 500 MG tablet Take 1 tablet (500 mg total) by mouth 2 (two) times daily. 11/28/21   Beverley Leita LABOR, PA-C  oxyCODONE -acetaminophen  (PERCOCET/ROXICET) 5-325 MG tablet Take 1-2 tablets by mouth every 4 (four) hours as needed for severe pain. 06/20/21   Pinn, Walda, MD  QUEtiapine  (SEROQUEL ) 300 MG tablet Take 300 mg by mouth at bedtime. 10/31/20   [provider]  topiramate  (TOPAMAX ) 50 MG tablet Take 50 mg by mouth daily.    [provider]  Vitamin D, Ergocalciferol, (DRISDOL) 1.25 MG (50000 UNIT) CAPS capsule Take 50,000 Units by mouth every Wednesday. 08/23/19   [provider]    Family History Family History  Problem Relation Age of Onset   Cancer Maternal Grandmother        Breast cancer   Cancer Paternal Grandmother        Breast cancer   Stroke Paternal Grandmother 58   Diabetes Cousin    Hypertension Cousin    Hypertension Father    Depression Father    Other Father        radiation for cysts on brain   Diabetes Mother    Depression Mother     Social History Social History   Tobacco Use   Smoking status: Never   Smokeless tobacco: Never  Vaping Use   Vaping status: Never Used  Substance Use Topics   Alcohol use: Not Currently   Drug use: Not Currently    Comment: quit smoking marijuana 2017     Allergies   Adhesive [tape], Hydrocodone -acetaminophen , and Iodine   Review of Systems Review of Systems  Skin:  Positive for wound.     Physical Exam Triage Vital Signs ED Triage Vitals  Encounter Vitals Group     BP 12/27/23 0943 (!) 191/134     Girls Systolic BP Percentile --      Girls Diastolic BP Percentile --      Boys Systolic BP Percentile --      Boys Diastolic BP Percentile --      Pulse Rate 12/27/23 0943 68      Resp 12/27/23 0943 20     Temp 12/27/23 0943 98 F (36.7 C)     Temp Source  12/27/23 0943 Oral     SpO2 12/27/23 0943 98 %     Weight 12/27/23 0941 260 lb (117.9 kg)     Height 12/27/23 0941 5' 5 (1.651 m)     Head Circumference --      Peak Flow --      Pain Score --      Pain Loc --      Pain Education --      Exclude from Growth Chart --    No data found.  Updated Vital Signs BP (!) 181/118 (BP Location: Right Arm)   Pulse 68   Temp 98 F (36.7 C) (Oral)   Resp 20   Ht 5' 5 (1.651 m)   Wt 260 lb (117.9 kg)   LMP 04/21/2021   SpO2 98%   BMI 43.27 kg/m   Visual Acuity Right Eye Distance:   Left Eye Distance:   Bilateral Distance:    Right Eye Near:   Left Eye Near:    Bilateral Near:     Physical Exam Vitals reviewed.  Constitutional:      General: She is not in acute distress.    Appearance: Normal appearance. She is not ill-appearing.  HENT:     Head: Normocephalic and atraumatic.  Pulmonary:     Effort: Pulmonary effort is normal.  Skin:    Comments: See image below L middle finger: palmar aspect with small 1mm puncture wound, with surrounding erythema and tenderness. ROM DIP and PIP intact. Cap refill <2 seconds   Neurological:     General: No focal deficit present.     Mental Status: She is alert and oriented to person, place, and time.  Psychiatric:        Mood and Affect: Mood normal.        Behavior: Behavior normal.        Thought Content: Thought content normal.        Judgment: Judgment normal.      UC Treatments / Results  Labs (all labs ordered are listed, but only abnormal results are displayed) Labs Reviewed - No data to display  EKG   Radiology No results found.  Procedures Procedures (including critical care time)  Medications Ordered in UC Medications - No data to display  Initial Impression / Assessment and Plan / UC Course  I have reviewed the triage vital signs and the nursing notes.  Pertinent labs & imaging  results that were available during my care of the patient were reviewed by me and considered in my medical decision making (see chart for details).     Patient is a pleasant 38 year old female presenting with finger infection following splinter. The patient is afebrile and nontachycardic.  Antipyretic has not been administered today.  There is no paronychia present.  I did not observe a splinter on exam.  Will manage with Keflex and soak in warm water.  Status post hysterectomy.  Final Clinical Impressions(s) / UC Diagnoses   Final diagnoses:  Finger infection     Discharge Instructions      -Start the antibiotic: Keflex, 4x daily x5 days. You can take this with food if you have a sensitive stomach. -Soak finger in warm water (soapy water or epsoam salt water okay) -Follow-up if symptoms worsen instead of improve     ED Prescriptions     Medication Sig Dispense Auth. Provider   cephALEXin (KEFLEX) 500 MG capsule Take 1 capsule (500 mg total) by mouth 4 (four) times daily. 20  capsule Oletta Buehring E, PA-C      PDMP not reviewed this encounter.   Arlyss Leita BRAVO, PA-C 12/27/23 1017

## 2023-12-27 NOTE — Discharge Instructions (Signed)
-  Start the antibiotic: Keflex, 4x daily x5 days. You can take this with food if you have a sensitive stomach. -Soak finger in warm water (soapy water or epsoam salt water okay) -Follow-up if symptoms worsen instead of improve

## 2023-12-27 NOTE — ED Triage Notes (Signed)
 Patient reports left hand (middle finger) accidentally getting stepped on by daughter yesterday when playing and a small splinter/piece of wood maybe got inside tip. Now swelling/pain.

## 2023-12-28 ENCOUNTER — Other Ambulatory Visit (HOSPITAL_BASED_OUTPATIENT_CLINIC_OR_DEPARTMENT_OTHER): Payer: Self-pay

## 2024-01-07 ENCOUNTER — Other Ambulatory Visit (HOSPITAL_BASED_OUTPATIENT_CLINIC_OR_DEPARTMENT_OTHER): Payer: Self-pay

## 2024-01-11 ENCOUNTER — Other Ambulatory Visit: Payer: Self-pay

## 2024-01-11 ENCOUNTER — Other Ambulatory Visit (HOSPITAL_COMMUNITY): Payer: Self-pay

## 2024-01-11 ENCOUNTER — Emergency Department (HOSPITAL_COMMUNITY)
Admission: EM | Admit: 2024-01-11 | Discharge: 2024-01-11 | Disposition: A | Discharge status: Home / Self Care | Payer: Self-pay | Attending: Emergency Medicine | Admitting: Emergency Medicine

## 2024-01-11 ENCOUNTER — Encounter (HOSPITAL_COMMUNITY): Payer: Self-pay

## 2024-01-11 ENCOUNTER — Emergency Department (HOSPITAL_COMMUNITY): Payer: Self-pay

## 2024-01-11 DIAGNOSIS — N132 Hydronephrosis with renal and ureteral calculous obstruction: Secondary | ICD-10-CM | POA: Insufficient documentation

## 2024-01-11 DIAGNOSIS — I10 Essential (primary) hypertension: Secondary | ICD-10-CM | POA: Insufficient documentation

## 2024-01-11 DIAGNOSIS — Z7982 Long term (current) use of aspirin: Secondary | ICD-10-CM | POA: Insufficient documentation

## 2024-01-11 DIAGNOSIS — Z79899 Other long term (current) drug therapy: Secondary | ICD-10-CM | POA: Insufficient documentation

## 2024-01-11 LAB — URINALYSIS, ROUTINE W REFLEX MICROSCOPIC
Bacteria, UA: NONE SEEN
Bilirubin Urine: NEGATIVE
Glucose, UA: NEGATIVE mg/dL
Hgb urine dipstick: NEGATIVE
Ketones, ur: NEGATIVE mg/dL
Leukocytes,Ua: NEGATIVE
Nitrite: NEGATIVE
Protein, ur: NEGATIVE mg/dL
Specific Gravity, Urine: 1.005 (ref 1.005–1.030)
pH: 8 (ref 5.0–8.0)

## 2024-01-11 LAB — COMPREHENSIVE METABOLIC PANEL WITH GFR
ALT: 31 U/L (ref 0–44)
AST: 23 U/L (ref 15–41)
Albumin: 3.5 g/dL (ref 3.5–5.0)
Alkaline Phosphatase: 62 U/L (ref 38–126)
Anion gap: 12 (ref 5–15)
BUN: 10 mg/dL (ref 6–20)
CO2: 24 mmol/L (ref 22–32)
Calcium: 8.9 mg/dL (ref 8.9–10.3)
Chloride: 101 mmol/L (ref 98–111)
Creatinine, Ser: 0.84 mg/dL (ref 0.44–1.00)
GFR, Estimated: >60 mL/min (ref >60)
Glucose, Bld: 90 mg/dL (ref 70–99)
Potassium: 3.5 mmol/L (ref 3.5–5.1)
Sodium: 137 mmol/L (ref 135–145)
Total Bilirubin: 0.8 mg/dL (ref 0.0–1.2)
Total Protein: 6.9 g/dL (ref 6.5–8.1)

## 2024-01-11 LAB — CBC
HCT: 40.8 % (ref 36.0–46.0)
Hemoglobin: 13.7 g/dL (ref 12.0–15.0)
MCH: 28.2 pg (ref 26.0–34.0)
MCHC: 33.6 g/dL (ref 30.0–36.0)
MCV: 84 fL (ref 80.0–100.0)
Platelets: 315 K/uL (ref 150–400)
RBC: 4.86 MIL/uL (ref 3.87–5.11)
RDW: 13.5 % (ref 11.5–15.5)
WBC: 9.1 K/uL (ref 4.0–10.5)
nRBC: 0 % (ref 0.0–0.2)

## 2024-01-11 LAB — LIPASE, BLOOD: Lipase: 35 U/L (ref 11–51)

## 2024-01-11 MED ORDER — NAPROXEN 500 MG PO TABS
500.0000 mg | ORAL_TABLET | Freq: Two times a day (BID) | ORAL | 0 refills | Status: AC
  Filled 2024-01-11: qty 30, 15d supply, fill #0

## 2024-01-11 MED ORDER — KETOROLAC TROMETHAMINE 15 MG/ML IJ SOLN
15.0000 mg | Freq: Once | INTRAMUSCULAR | Status: AC
  Administered 2024-01-11: 15 mg via INTRAVENOUS
  Filled 2024-01-11: qty 1

## 2024-01-11 MED ORDER — MORPHINE SULFATE (PF) 2 MG/ML IV SOLN
2.0000 mg | Freq: Once | INTRAVENOUS | Status: AC
  Administered 2024-01-11: 2 mg via INTRAVENOUS
  Filled 2024-01-11: qty 1

## 2024-01-11 MED ORDER — ONDANSETRON HCL 4 MG PO TABS
4.0000 mg | ORAL_TABLET | Freq: Four times a day (QID) | ORAL | 0 refills | Status: AC
  Filled 2024-01-11: qty 12, 3d supply, fill #0

## 2024-01-11 MED ORDER — IOHEXOL 350 MG/ML SOLN
75.0000 mL | Freq: Once | INTRAVENOUS | Status: AC | PRN
  Administered 2024-01-11: 75 mL via INTRAVENOUS

## 2024-01-11 MED ORDER — ONDANSETRON HCL 4 MG/2ML IJ SOLN
4.0000 mg | Freq: Once | INTRAMUSCULAR | Status: AC
  Administered 2024-01-11: 4 mg via INTRAVENOUS
  Filled 2024-01-11: qty 2

## 2024-01-11 MED ORDER — TAMSULOSIN HCL 0.4 MG PO CAPS
0.4000 mg | ORAL_CAPSULE | Freq: Every day | ORAL | 0 refills | Status: AC
  Filled 2024-01-11: qty 30, 30d supply, fill #0

## 2024-01-11 MED ORDER — DIPHENHYDRAMINE HCL 50 MG/ML IJ SOLN
50.0000 mg | Freq: Once | INTRAMUSCULAR | Status: AC
  Administered 2024-01-11: 50 mg via INTRAVENOUS
  Filled 2024-01-11: qty 1

## 2024-01-11 MED ORDER — METHYLPREDNISOLONE SODIUM SUCC 40 MG IJ SOLR
40.0000 mg | Freq: Once | INTRAMUSCULAR | Status: AC
  Administered 2024-01-11: 40 mg via INTRAVENOUS
  Filled 2024-01-11: qty 1

## 2024-01-11 MED ORDER — DIPHENHYDRAMINE HCL 25 MG PO CAPS: 50.0000 mg | ORAL_CAPSULE | Freq: Once | ORAL | Status: AC

## 2024-01-11 NOTE — Discharge Instructions (Addendum)
 Your CT scan resulted in a kidney stone blocking flow of urine.  I have prescribed you Flomax  to help promote urination along with naproxen  for pain and Zofran  if nausea recurs.  I given you follow-up with urology for further evaluation.  If you experience worsening symptoms please return the ED for further evaluation.

## 2024-01-11 NOTE — ED Notes (Signed)
..  Patient is A&Ox4 upon discharge. Patient verbalized understanding of prescriptions, discharge instructions and follow-up care. Patient ambulatory from ED with steady gait.

## 2024-01-11 NOTE — ED Provider Notes (Signed)
 [place] [facility] AT [facility] Provider Note   CSN: [id number] Arrival date & time: 01/11/24  9173     Patient presents with: Abdominal Pain   Martha Mathews is a 38 y.o. female.  38 year old female presents ED with complaints of right lower quadrant abdominal pain since this morning.  Patient reports that she woke up this morning with some crampy feeling in her stomach went to go use the restroom and had an episode of diarrhea with subsequent severe right lower quadrant abdominal pain that has not subsided and has gotten worse in severity.  Patient denies any vomiting but does endorse some nausea.  Patient reports she has felt like she has had chills but no recorded fever at home.  Patient denies any shortness of breath or chest pain. Patient has history of cholecystectomy but still has appendix.  Patient has had a full hysterectomy.  Patient is on lisinopril  30 mg/day and Prozac  and Abilify  for bipolar depression.  No other daily medications or medical diagnoses reported by patient at bedside.     Prior to Admission medications   Medication Sig Start Date End Date Taking? Authorizing Provider  naproxen  (NAPROSYN ) 500 MG tablet Take 1 tablet (500 mg total) by mouth 2 (two) times daily. 01/11/24  Yes Myriam Fonda RAMAN, PA-C  ondansetron  (ZOFRAN ) 4 MG tablet Take 1 tablet (4 mg total) by mouth every 6 (six) hours. 01/11/24  Yes Myriam Fonda RAMAN, PA-C  tamsulosin  (FLOMAX ) 0.4 MG CAPS capsule Take 1 capsule (0.4 mg total) by mouth daily after breakfast. 01/11/24  Yes Shiesha Jahn S, PA-C  acetaminophen  (TYLENOL ) 500 MG tablet Take 1,000 mg by mouth every 6 (six) hours as needed for headache.    [provider]  ALPRAZolam  (XANAX ) 1 MG tablet Take 1 mg by mouth 2 (two) times daily as needed for anxiety. 01/09/21   [provider]  aspirin EC 81 MG tablet Take 81 mg by mouth daily. Swallow whole.    [provider]  cephALEXin  (KEFLEX ) 500  MG capsule Take 1 capsule (500 mg total) by mouth 4 (four) times daily. 12/27/23   Graham, Laura E, PA-C  diclofenac  Sodium (VOLTAREN ) 1 % GEL Apply 4 g topically 4 (four) times daily. 11/28/21   Beverley Leita LABOR, PA-C  FLUoxetine  (PROZAC ) 40 MG capsule Take 40 mg by mouth daily.  06/26/16   [provider]  gabapentin  (NEURONTIN ) 100 MG capsule Take 200 mg by mouth at bedtime. 09/17/19   [provider]  ibuprofen  (ADVIL ) 600 MG tablet Take 1 tablet (600 mg total) by mouth every 6 (six) hours as needed. 11/14/23   Myriam Dorn BROCKS, PA  ibuprofen  (ADVIL ) 800 MG tablet Take 1 tablet (800 mg total) by mouth every 8 (eight) hours. 06/20/21   Pinn, Walda, MD  linaclotide (LINZESS) 145 MCG CAPS capsule Take 145 mcg by mouth 2 (two) times a week.    [provider]  lisinopril  (ZESTRIL ) 20 MG tablet Take 20 mg by mouth daily.    [provider]  lisinopril  (ZESTRIL ) 20 MG tablet Take 1 tablet (20 mg total) by mouth daily. 11/14/23 12/14/23  Myriam Dorn BROCKS, PA  methocarbamol  (ROBAXIN ) 500 MG tablet Take 1 tablet (500 mg total) by mouth 2 (two) times daily. 11/28/21   Beverley Leita LABOR, PA-C  oxyCODONE -acetaminophen  (PERCOCET/ROXICET) 5-325 MG tablet Take 1-2 tablets by mouth every 4 (four) hours as needed for severe pain. 06/20/21   Pinn, Walda, MD  QUEtiapine  (SEROQUEL )  300 MG tablet Take 300 mg by mouth at bedtime. 10/31/20   [provider]  topiramate  (TOPAMAX ) 50 MG tablet Take 50 mg by mouth daily.    [provider]  Vitamin D, Ergocalciferol, (DRISDOL) 1.25 MG (50000 UNIT) CAPS capsule Take 50,000 Units by mouth every Wednesday. 08/23/19   [provider]    Allergies: Adhesive [tape], Hydrocodone -acetaminophen , and Iodine    Review of Systems  Gastrointestinal:  Positive for abdominal pain and nausea.  All other systems reviewed and are negative.   Updated Vital Signs BP (!) 174/100 (BP Location: Right Arm)   Pulse 66   Temp 98.2 F  (36.8 C) (Oral)   Resp 11   Ht 5' 5 (1.651 m)   Wt 121.1 kg   LMP 04/21/2021   SpO2 97%   BMI 44.43 kg/m   Physical Exam Vitals and nursing note reviewed.  Constitutional:      Appearance: Normal appearance.  HENT:     Head: Normocephalic and atraumatic.     Nose: Nose normal.  Eyes:     General: No scleral icterus.    Extraocular Movements: Extraocular movements intact.     Conjunctiva/sclera: Conjunctivae normal.     Pupils: Pupils are equal, round, and reactive to light.  Cardiovascular:     Rate and Rhythm: Normal rate.  Pulmonary:     Effort: Pulmonary effort is normal. No respiratory distress.     Breath sounds: Normal breath sounds.  Abdominal:     General: Bowel sounds are normal.     Palpations: Abdomen is soft.     Tenderness: There is abdominal tenderness in the right lower quadrant and periumbilical area. There is guarding. There is no right CVA tenderness or left CVA tenderness. Positive signs include McBurney's sign.     Comments: Patient has positive McBurney's sign.  No pain in other quadrants with some mild pain to palpation in the periumbilical area.  Bowel sounds are normal with no obvious distention.  Musculoskeletal:        General: Normal range of motion.     Cervical back: Normal range of motion.     Comments: No pitting edema noted in bilateral extremities.  No unilateral leg swelling redness or warmth noted.  Skin:    General: Skin is warm.     Capillary Refill: Capillary refill takes less than 2 seconds.     Coloration: Skin is not jaundiced.  Neurological:     General: No focal deficit present.     Mental Status: She is alert.  Psychiatric:        Mood and Affect: Mood normal.        Behavior: Behavior normal.     (all labs ordered are listed, but only abnormal results are displayed) Labs Reviewed  LIPASE, BLOOD  COMPREHENSIVE METABOLIC PANEL WITH GFR  CBC  URINALYSIS, ROUTINE W REFLEX MICROSCOPIC    EKG: None  Radiology: CT  ABDOMEN PELVIS W CONTRAST Result Date: 01/11/2024 CLINICAL DATA:  Right lower quadrant abdominal pain EXAM: CT ABDOMEN AND PELVIS WITH CONTRAST TECHNIQUE: Multidetector CT imaging of the abdomen and pelvis was performed using the standard protocol following bolus administration of intravenous contrast. RADIATION DOSE REDUCTION: This exam was performed according to the departmental dose-optimization program which includes automated exposure control, adjustment of the mA and/or kV according to patient size and/or use of iterative reconstruction technique. CONTRAST:  75mL OMNIPAQUE  IOHEXOL  350 MG/ML SOLN COMPARISON:  CT abdomen and pelvis dated 07/07/2019 FINDINGS: Lower chest:  No focal consolidation or pulmonary nodule in the lung bases. No pleural effusion or pneumothorax demonstrated. Partially imaged heart size is normal. Hepatobiliary: No focal hepatic lesions. No intra or extrahepatic biliary ductal dilation. Cholecystectomy. Pancreas: No focal lesions or main ductal dilation. Spleen: Normal in size without focal abnormality. Adrenals/Urinary Tract: No adrenal nodules. Mild right hydroureteronephrosis upstream of a 2 mm distal ureteral stone. No left hydronephrosis. No additional calculi. No suspicious renal masses. No focal bladder wall thickening. Stomach/Bowel: Normal appearance of the stomach. No evidence of bowel wall thickening, distention, or inflammatory changes. Normal appendix. Vascular/Lymphatic: No significant vascular findings are present. No enlarged abdominal or pelvic lymph nodes. Reproductive: No adnexal masses. Other: No free fluid, fluid collection, or free air. Musculoskeletal: No acute or abnormal lytic or blastic osseous lesions. IMPRESSION: 1. Mild right hydroureteronephrosis upstream of a 2 mm distal ureteral stone. 2. Normal appendix. Electronically Signed   By: Limin  Xu M.D.   On: 01/11/2024 15:13     Procedures   Medications Ordered in the ED  ondansetron  (ZOFRAN ) injection 4  mg (4 mg Intravenous Given 01/11/24 0908)  ketorolac  (TORADOL ) 15 MG/ML injection 15 mg (15 mg Intravenous Given 01/11/24 0910)  methylPREDNISolone  sodium succinate (SOLU-MEDROL ) 40 mg/mL injection 40 mg (40 mg Intravenous Given 01/11/24 1002)  diphenhydrAMINE  (BENADRYL ) capsule 50 mg ( Oral See Alternative 01/11/24 1303)    Or  diphenhydrAMINE  (BENADRYL ) injection 50 mg (50 mg Intravenous Given 01/11/24 1303)  morphine  (PF) 2 MG/ML injection 2 mg (2 mg Intravenous Given 01/11/24 1327)  iohexol  (OMNIPAQUE ) 350 MG/ML injection 75 mL (75 mLs Intravenous Contrast Given 01/11/24 1427)    38 y.o. female presents to the ED with complaints of right lower quadrant abdominal pain and nausea, The differential diagnosis includes but is not limited to appendicitis, colitis, UTI, pyelonephritis, viral illness, nephrolithiasis, ureterolithiasis (Ddx)  On arrival pt is nontoxic, vitals significant for hypertension patient does have history of hypertension and is prescribed lisinopril . Exam significant for right lower quadrant tenderness on palpation  I ordered medication Zofran  and Toradol  for pain and nausea  Lab Tests:  I Ordered, reviewed, and interpreted labs, which included: CMP CBC lipase UA.  All unremarkable.  Imaging Studies ordered:  I ordered imaging studies which included CT abdomen pelvis with contrast.  Patient had reported iodine allergy and has had several CT scans with contrast in the past.  Patient reports that the last CT scan that she received did result in hives but she has never had problems in the past.  Out of precautions contrast prophylactic will be performed prior to CT scan.  CT scan showed mild right hydronephrosis with 2 mm distal ureteral stone.  ED Course:   Patient is sitting in the bed reporting that she does have some pain and she does appear uncomfortable.  Patient is nontoxic.  Pending lab and CT scan patient was offered pain medication and she reports she does not  want anything opioid related because she is afraid she will get a panic attack if she takes opioids.  Patient has had a hysterectomy Toradol  will be trialed for pain management.  Toradol  will help with pain momentarily but patient requested more pain management while waiting for CT scan.  Patient advised she was comfortable trialing low doses of opioids and seeing how she responds.  Patient was given morphine  2 mg and will be reassessed after.  Pain was managed with 3 mg morphine .  CT scan resulted and hydronephrosis with a 2 mm distal ureteral  stone.  Patient was advised of findings.  All questions were answered at bedside and patient was given Zofran  and naproxen  and Flomax  with urology follow-up.  Patient agreed to treatment plan and is comfortable discharge at this time.  Patient was given strict return precautions and agreed.  Portions of this note were generated with Scientist, clinical (histocompatibility and immunogenetics). Dictation errors may occur despite best attempts at proofreading.   Final diagnoses:  Hydronephrosis with urinary obstruction due to ureteral calculus    ED Discharge Orders          Ordered    tamsulosin  (FLOMAX ) 0.4 MG CAPS capsule  Daily after breakfast        01/11/24 1548    ondansetron  (ZOFRAN ) 4 MG tablet  Every 6 hours        01/11/24 1548    naproxen  (NAPROSYN ) 500 MG tablet  2 times daily        01/11/24 1548               Myriam Fonda RAMAN, [place] 01/12/24 1152    Elnor Savant A, DO 01/14/24 [phone]

## 2024-01-11 NOTE — ED Triage Notes (Addendum)
 Pt BIB [facility] from home d/t intermittent sharp/stabbing RLQ abd pain that radiates to her pelvic region. A/Ox4, 142/78, 99% on RA, 71 bpm, 20 resp. Hx of HTN & allergies to tape, iodine & hydrocodone . Rates pain 10/10, endorses n/v.

## 2024-01-11 NOTE — ED Notes (Signed)
 Patient given ice chips per PA.

## 2024-01-11 NOTE — ED Notes (Signed)
 Patient reports NO iodine reaction symptoms at this time. Free of hives and itching.

## 2024-03-23 ENCOUNTER — Ambulatory Visit
Admission: EM | Admit: 2024-03-23 | Discharge: 2024-03-23 | Disposition: A | Discharge status: Home / Self Care | Payer: Self-pay | Source: Home / Self Care

## 2024-03-23 ENCOUNTER — Other Ambulatory Visit (HOSPITAL_COMMUNITY): Payer: Self-pay

## 2024-03-23 ENCOUNTER — Encounter: Payer: Self-pay | Admitting: Emergency Medicine

## 2024-03-23 DIAGNOSIS — G43919 Migraine, unspecified, intractable, without status migrainosus: Secondary | ICD-10-CM

## 2024-03-23 DIAGNOSIS — H5789 Other specified disorders of eye and adnexa: Secondary | ICD-10-CM

## 2024-03-23 MED ORDER — DICLOFENAC SODIUM 50 MG PO TBEC
50.0000 mg | DELAYED_RELEASE_TABLET | Freq: Two times a day (BID) | ORAL | 1 refills | Status: DC
  Filled 2024-03-23: qty 30, 15d supply, fill #0

## 2024-03-23 MED ORDER — AZELASTINE HCL 0.1 % NA SOLN
1.0000 | Freq: Two times a day (BID) | NASAL | 1 refills | Status: DC
  Filled 2024-03-23: qty 30, 100d supply, fill #0

## 2024-03-23 MED ORDER — KETOROLAC TROMETHAMINE 30 MG/ML IJ SOLN
30.0000 mg | Freq: Once | INTRAMUSCULAR | Status: AC
  Administered 2024-03-23: 30 mg via INTRAMUSCULAR

## 2024-03-23 MED ORDER — DICLOFENAC SODIUM 50 MG PO TBEC
50.0000 mg | DELAYED_RELEASE_TABLET | Freq: Two times a day (BID) | ORAL | 1 refills | Status: AC
  Filled 2024-03-23: qty 30, 15d supply, fill #0

## 2024-03-23 MED ORDER — AZELASTINE HCL 0.1 % NA SOLN
1.0000 | Freq: Two times a day (BID) | NASAL | 1 refills | Status: AC
  Filled 2024-03-23: qty 30, 50d supply, fill #0

## 2024-03-23 NOTE — Discharge Instructions (Signed)
" °  1. Intractable migraine without status migrainosus, unspecified migraine type (Primary) - ketorolac  (TORADOL ) 30 MG/ML injection 30 mg given in UC for acute intractable migraine headache - diclofenac  (VOLTAREN ) 50 MG EC tablet; Take 1 tablet (50 mg total) by mouth 2 (two) times daily.  Dispense: 30 tablet; Refill: 1  2. Eye drainage - azelastine  (ASTELIN ) 0.1 % nasal spray; Place 1 spray into both nostrils 2 (two) times daily. Use in each nostril as directed  Dispense: 30 mL; Refill: 1 - Follow-up with eye care specialist for further evaluation if symptoms do not improve with current therapy.  -Continue to monitor symptoms for any change in severity if there is any escalation of current symptoms or development of new symptoms follow-up in ER for further evaluation and management. "

## 2024-03-23 NOTE — ED Provider Notes (Signed)
 " [facility]  Note:  This document was prepared using Dragon voice recognition software and may include unintentional dictation errors.  MRN: [id number] DOB: [birth year]  Subjective:   Martha Mathews is a 39 y.o. female presenting for migraine headache, bilateral eye watering and lump under her eyelid to bilateral upper eyelids x 1 day.  Patient reports mild dizziness and occasional blurred vision to the right eye.  Denies any nausea or vomiting, no abdominal pain, diarrhea.  Patient is been taking ibuprofen  for headache with minimal improvement.  Patient reports that she was previously diagnosed with migraine syndrome and was previously treated with naproxen , patient currently has no medication for treatment.  No shortness of breath, chest pain, weakness, dizziness, purulent eye drainage, vision loss, pain with eye movement.  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, TYPE I STABLE   Chlamydia 2004   Depression    not currently on meds, being treated- trying to find a med   Dyslexia    Hypertelorism    Hypertension 2022   Migraines    otc med prn w/pregnancy   Neisseria gonorrhoeae    Aug 2010, treated by Ob   Ringworm    Urinary tract infection      Past Surgical History:  Procedure Laterality Date   BILATERAL SALPINGECTOMY Bilateral 04/21/2012   Procedure: BILATERAL SALPINGECTOMY;  Surgeon: Krystal Deaner, MD;  Location: [facility];  Service: Obstetrics;  Laterality: Bilateral;   CESAREAN SECTION  2006   CESAREAN SECTION N/A 04/21/2012   Procedure: CESAREAN SECTION;  Surgeon: Krystal Deaner, MD;  Location: [facility];  Service: Obstetrics;  Laterality: N/A;  Repeat   CHOLECYSTECTOMY  2006   CYSTOSCOPY  06/19/2021   Procedure: CYSTOSCOPY;  Surgeon: Bettina Muskrat, MD;  Location: [facility];  Service:  Gynecology;;   EYE SURGERY     left eye surgery - lazy eye   INDUCED ABORTION     vaccum on oct 2009   MYRINGOTOMY     as a child   TONSILLECTOMY     as a child   TOTAL LAPAROSCOPIC HYSTERECTOMY WITH SALPINGECTOMY Bilateral 06/19/2021   Procedure: TOTAL LAPAROSCOPIC HYSTERECTOMY WITH SALPINGECTOMY;  Surgeon: Bettina Muskrat, MD;  Location: [facility];  Service: Gynecology;  Laterality: Bilateral;   TUBAL LIGATION  2014   pt believes a portion of fallopian tube(s) were removed    Family History  Problem Relation Age of Onset   Cancer Maternal Grandmother        Breast cancer   Cancer Paternal Grandmother        Breast cancer   Stroke Paternal Grandmother 32   Diabetes Cousin    Hypertension Cousin    Hypertension Father    Depression Father    Other Father        radiation for cysts on brain   Diabetes Mother    Depression Mother     Social History[3]  ROS Refer to HPI for ROS details.  Objective:   Vitals: BP (!) 160/109 (BP Location: Left Arm)   Pulse 77   Temp 98.8 F (37.1 C) (Oral)   Resp 18   LMP 04/21/2021   SpO2 95%   Physical Exam Vitals and nursing note reviewed.  Constitutional:      General:  She is not in acute distress.    Appearance: Normal appearance. She is well-developed. She is not ill-appearing or toxic-appearing.  HENT:     Head: Normocephalic and atraumatic.  Eyes:     General: Lids are normal. Lids are everted, no foreign bodies appreciated. Vision grossly intact. Gaze aligned appropriately. No allergic shiner or visual field deficit.       Right eye: No foreign body, discharge or hordeolum.        Left eye: No foreign body, discharge or hordeolum.     Extraocular Movements: Extraocular movements intact.     Right eye: Normal extraocular motion.     Left eye: Normal extraocular motion.     Conjunctiva/sclera: Conjunctivae normal.     Right eye: Right conjunctiva is not injected. No exudate.    Left eye: Left conjunctiva is not injected. No  exudate.    Pupils: Pupils are equal, round, and reactive to light.   Cardiovascular:     Rate and Rhythm: Normal rate.  Pulmonary:     Effort: Pulmonary effort is normal. No respiratory distress.  Musculoskeletal:        General: Normal range of motion.  Skin:    General: Skin is warm and dry.  Neurological:     General: No focal deficit present.     Mental Status: She is alert and oriented to person, place, and time.  Psychiatric:        Mood and Affect: Mood normal.        Behavior: Behavior normal.     Procedures  No results found for this or any previous visit (from the past 24 hours).  No results found.   Assessment and Plan :     Discharge Instructions       1. Intractable migraine without status migrainosus, unspecified migraine type (Primary) - ketorolac  (TORADOL ) 30 MG/ML injection 30 mg given in UC for acute intractable migraine headache - diclofenac  (VOLTAREN ) 50 MG EC tablet; Take 1 tablet (50 mg total) by mouth 2 (two) times daily.  Dispense: 30 tablet; Refill: 1  2. Eye drainage - azelastine  (ASTELIN ) 0.1 % nasal spray; Place 1 spray into both nostrils 2 (two) times daily. Use in each nostril as directed  Dispense: 30 mL; Refill: 1 - Follow-up with eye care specialist for further evaluation if symptoms do not improve with current therapy.  -Continue to monitor symptoms for any change in severity if there is any escalation of current symptoms or development of new symptoms follow-up in ER for further evaluation and management.       Martha Mathews    [1] No current facility-administered medications for this encounter.  Current Outpatient Medications:    acetaminophen  (TYLENOL ) 500 MG tablet, Take 1,000 mg by mouth every 6 (six) hours as needed for headache., Disp: , Rfl:    ALPRAZolam  (XANAX ) 1 MG tablet, Take 1 mg by mouth 2 (two) times daily as needed for anxiety., Disp: , Rfl:    aspirin EC 81 MG tablet, Take 81 mg by mouth daily. Swallow  whole., Disp: , Rfl:    azelastine  (ASTELIN ) 0.1 % nasal spray, Place 1 spray into both nostrils 2 (two) times daily. Use in each nostril as directed, Disp: 30 mL, Rfl: 1   cephALEXin  (KEFLEX ) 500 MG capsule, Take 1 capsule (500 mg total) by mouth 4 (four) times daily., Disp: 20 capsule, Rfl: 0   diclofenac  (VOLTAREN ) 50 MG EC tablet, Take 1 tablet (50 mg total) by mouth  2 (two) times daily., Disp: 30 tablet, Rfl: 1   diclofenac  Sodium (VOLTAREN ) 1 % GEL, Apply 4 g topically 4 (four) times daily., Disp: 100 g, Rfl: 0   FLUoxetine  (PROZAC ) 40 MG capsule, Take 40 mg by mouth daily. , Disp: , Rfl: 1   gabapentin  (NEURONTIN ) 100 MG capsule, Take 200 mg by mouth at bedtime., Disp: , Rfl:    ibuprofen  (ADVIL ) 600 MG tablet, Take 1 tablet (600 mg total) by mouth every 6 (six) hours as needed., Disp: 30 tablet, Rfl: 0   ibuprofen  (ADVIL ) 800 MG tablet, Take 1 tablet (800 mg total) by mouth every 8 (eight) hours., Disp: 60 tablet, Rfl: 3   linaclotide (LINZESS) 145 MCG CAPS capsule, Take 145 mcg by mouth 2 (two) times a week., Disp: , Rfl:    lisinopril  (ZESTRIL ) 20 MG tablet, Take 20 mg by mouth daily., Disp: , Rfl:    lisinopril  (ZESTRIL ) 20 MG tablet, Take 1 tablet (20 mg total) by mouth daily., Disp: 30 tablet, Rfl: 1   methocarbamol  (ROBAXIN ) 500 MG tablet, Take 1 tablet (500 mg total) by mouth 2 (two) times daily., Disp: 20 tablet, Rfl: 0   naproxen  (NAPROSYN ) 500 MG tablet, Take 1 tablet (500 mg total) by mouth 2 (two) times daily., Disp: 30 tablet, Rfl: 0   ondansetron  (ZOFRAN ) 4 MG tablet, Take 1 tablet (4 mg total) by mouth every 6 (six) hours., Disp: 12 tablet, Rfl: 0   oxyCODONE -acetaminophen  (PERCOCET/ROXICET) 5-325 MG tablet, Take 1-2 tablets by mouth every 4 (four) hours as needed for severe pain., Disp: 30 tablet, Rfl: 0   QUEtiapine  (SEROQUEL ) 300 MG tablet, Take 300 mg by mouth at bedtime., Disp: , Rfl:    tamsulosin  (FLOMAX ) 0.4 MG CAPS capsule, Take 1 capsule (0.4 mg total) by mouth daily  after breakfast., Disp: 30 capsule, Rfl: 0   topiramate  (TOPAMAX ) 50 MG tablet, Take 50 mg by mouth daily., Disp: , Rfl:    Vitamin D, Ergocalciferol, (DRISDOL) 1.25 MG (50000 UNIT) CAPS capsule, Take 50,000 Units by mouth every Wednesday., Disp: , Rfl:  [2]  Allergies Allergen Reactions   Adhesive [Tape] Hives and Rash    redness   Hydrocodone -Acetaminophen  Itching and Rash   Iodine Itching and Rash  [3]  Social History Tobacco Use   Smoking status: Never   Smokeless tobacco: Never  Vaping Use   Vaping status: Never Used  Substance Use Topics   Alcohol use: Not Currently   Drug use: Not Currently    Comment: quit smoking marijuana 2017     Aurea Goodell B, NP 03/23/24 1325  "

## 2024-03-23 NOTE — ED Triage Notes (Addendum)
 Patient presents for migraine, and lumps under both eye lids x 1 day.  Patient has dizziest and blurry vision in her right eye.  Patient denies nausea.  Patient has been taken ibuprofen .
